# Patient Record
Sex: Female | Born: 1970 | Race: White | Hispanic: No | Marital: Single | Smoking: Current every day smoker
Health system: Southern US, Community
[De-identification: ages and names within clinical notes are randomized; demographics above are authoritative.]

## PROBLEM LIST (undated history)

## (undated) DIAGNOSIS — C801 Malignant (primary) neoplasm, unspecified: Secondary | ICD-10-CM

## (undated) DIAGNOSIS — M549 Dorsalgia, unspecified: Secondary | ICD-10-CM

## (undated) DIAGNOSIS — I63512 Cerebral infarction due to unspecified occlusion or stenosis of left middle cerebral artery: Secondary | ICD-10-CM

## (undated) DIAGNOSIS — J449 Chronic obstructive pulmonary disease, unspecified: Secondary | ICD-10-CM

## (undated) HISTORY — PX: LUNG SURGERY: SHX703

## (undated) HISTORY — PX: BREAST SURGERY: SHX581

## (undated) HISTORY — PX: OTHER SURGICAL HISTORY: SHX169

## (undated) HISTORY — PX: WRIST FRACTURE SURGERY: SHX121

## (undated) HISTORY — PX: LOBECTOMY: SHX5089

---

## 2019-07-23 ENCOUNTER — Other Ambulatory Visit: Payer: Self-pay

## 2019-07-23 ENCOUNTER — Ambulatory Visit
Admission: EM | Admit: 2019-07-23 | Discharge: 2019-07-23 | Disposition: A | Discharge status: Home / Self Care | Payer: Managed Care, Other (non HMO) | Attending: Physician Assistant | Admitting: Physician Assistant

## 2019-07-23 ENCOUNTER — Encounter: Payer: Self-pay | Admitting: Emergency Medicine

## 2019-07-23 DIAGNOSIS — Z85118 Personal history of other malignant neoplasm of bronchus and lung: Secondary | ICD-10-CM

## 2019-07-23 DIAGNOSIS — R590 Localized enlarged lymph nodes: Secondary | ICD-10-CM | POA: Insufficient documentation

## 2019-07-23 DIAGNOSIS — F17200 Nicotine dependence, unspecified, uncomplicated: Secondary | ICD-10-CM

## 2019-07-23 DIAGNOSIS — J029 Acute pharyngitis, unspecified: Secondary | ICD-10-CM | POA: Diagnosis present

## 2019-07-23 DIAGNOSIS — R509 Fever, unspecified: Secondary | ICD-10-CM | POA: Diagnosis present

## 2019-07-23 HISTORY — DX: Dorsalgia, unspecified: M54.9

## 2019-07-23 HISTORY — DX: Malignant (primary) neoplasm, unspecified: C80.1

## 2019-07-23 LAB — POCT RAPID STREP A (OFFICE): Rapid Strep A Screen: NEGATIVE

## 2019-07-23 NOTE — ED Triage Notes (Addendum)
Pt presents to [facility] for assessment of headache, "swollen" through (denies pain), and post-nasal drip.  Denies cough that is abnormal for her (smoker), denies n/v/d, denies abdominal pain.  States 100.5 temperature yesterday with relief with Tylenol.

## 2019-07-23 NOTE — Discharge Instructions (Addendum)
Rapid strep was negative. Covid and culture strep is pending and will call if positive. Most likely a viral infection. If worsen then f/u up. Otherwise symptomatic relief Tyelnol and fluids. If lymphadenopathy persist then please keep f/u with your PCP or oncologist.

## 2019-07-23 NOTE — ED Notes (Signed)
Patient able to ambulate independently  

## 2019-07-23 NOTE — ED Provider Notes (Signed)
[facility]    CSN: [id number] Arrival date & time: 07/23/19  1111      History   Chief Complaint Chief Complaint  Patient presents with  . URI    HPI Courtney Mayer is a 49 y.o. female.   Who has a history of stage 2 lung cancer, aneurysms and copd. She is currently an ongoing smoker. She presents with a less than 24 history of painless neck lymph nodes and mild sore throat with headache. She did have a fever yesterday as well. No chills. No acute cough, no N, V. No abdominal pain.      Past Medical History:  Diagnosis Date  . Back pain   . Cancer ([place])    lung    There are no problems to display for this patient.   Past Surgical History:  Procedure Laterality Date  . CESAREAN SECTION    . LOBECTOMY    . WRIST FRACTURE SURGERY      OB History   No obstetric history on file.      Home Medications    Prior to Admission medications   Medication Sig Start Date End Date Taking? Authorizing Provider  ALPRAZolam Duanne Moron) 0.25 MG tablet Take 0.25 mg by mouth at bedtime as needed for anxiety.   Yes [provider]  amitriptyline (ELAVIL) 50 MG tablet Take 50 mg by mouth at bedtime.   Yes [provider]  gabapentin (NEURONTIN) 800 MG tablet Take 900 mg by mouth 3 (three) times daily.   Yes [provider]  lurasidone (LATUDA) 40 MG TABS tablet Take 60 mg by mouth at bedtime.   Yes [provider]  methocarbamol (ROBAXIN) 750 MG tablet Take 750 mg by mouth 4 (four) times daily.   Yes [provider]  montelukast (SINGULAIR) 10 MG tablet Take 10 mg by mouth at bedtime.   Yes [provider]  oxyCODONE (ROXICODONE) 15 MG immediate release tablet Take 20 mg by mouth every 4 (four) hours as needed for pain.   Yes [provider]  oxyCODONE-acetaminophen (PERCOCET) 10-325 MG tablet Take 1 tablet by mouth every 4 (four) hours as needed for pain.   Yes [provider]  pantoprazole  (PROTONIX) 40 MG tablet Take 40 mg by mouth daily.   Yes [provider]    Family History Family History  Problem Relation Age of Onset  . Healthy Mother   . Healthy Father     Social History Social History   Tobacco Use  . Smoking status: Current Every Day Smoker    Packs/day: 1.00  . Smokeless tobacco: Never Used  Substance Use Topics  . Alcohol use: Not Currently  . Drug use: Never     Allergies   Amoxicillin and Compazine [prochlorperazine]   Review of Systems Review of Systems  Constitutional: Positive for fever. Negative for chills.  HENT: Positive for sore throat. Negative for congestion, postnasal drip, rhinorrhea and sinus pain.   Eyes: Negative.   Respiratory: Positive for cough.        Chronic with COPD and Lung NSC  Gastrointestinal: Negative for nausea and vomiting.  Musculoskeletal: Negative.   Neurological: Negative.   Psychiatric/Behavioral: Negative.      Physical Exam Triage Vital Signs ED Triage Vitals  Enc Vitals Group     BP      Pulse      Resp      Temp      Temp src  SpO2      Weight      Height      Head Circumference      Peak Flow      Pain Score      Pain Loc      Pain Edu?      Excl. in [place]?    No data found.  Updated Vital Signs BP 109/74 (BP Location: Left Arm)   Pulse (!) 104   Temp 98.2 F (36.8 C) (Temporal)   Resp 18   SpO2 91%   Visual Acuity Right Eye Distance:   Left Eye Distance:   Bilateral Distance:    Right Eye Near:   Left Eye Near:    Bilateral Near:     Physical Exam Vitals and nursing note reviewed.  Constitutional:      General: She is not in acute distress.    Appearance: Normal appearance. She is normal weight. She is not ill-appearing, toxic-appearing or diaphoretic.  HENT:     Head: Normocephalic and atraumatic.     Right Ear: Tympanic membrane normal.     Left Ear: Tympanic membrane normal.     Mouth/Throat:     Mouth: Mucous membranes are moist.     Pharynx:  Posterior oropharyngeal erythema present. No oropharyngeal exudate.     Comments: Injected oropharynx without frank exudate Eyes:     General: No scleral icterus. Neck:     Comments: Tender adenopathy to palpation Cardiovascular:     Rate and Rhythm: Normal rate.  Pulmonary:     Effort: Pulmonary effort is normal.     Breath sounds: No wheezing or rhonchi.  Musculoskeletal:     Cervical back: Normal range of motion.  Lymphadenopathy:     Cervical: Cervical adenopathy present.  Skin:    General: Skin is warm and dry.  Neurological:     General: No focal deficit present.     Mental Status: She is alert.  Psychiatric:        Thought Content: Thought content normal.      UC Treatments / Results  Labs (all labs ordered are listed, but only abnormal results are displayed) Labs Reviewed  POCT RAPID STREP A (OFFICE) - Normal  NOVEL CORONAVIRUS, NAA  CULTURE, GROUP A STREP [facility])    EKG   Radiology No results found.  Procedures Procedures (including critical care time)  Medications Ordered in UC Medications - No data to display  Initial Impression / Assessment and Plan / UC Course  I have reviewed the triage vital signs and the nursing notes.  Pertinent labs & imaging results that were available during my care of the patient were reviewed by me and considered in my medical decision making (see chart for details).     No indication of a bacterial infection or need for antibiotic use. Treat symptomatically. If the symptoms persist then f/u with PCP vs regular oncologist   Final Clinical Impressions(s) / UC Diagnoses   Final diagnoses:  Sore throat  Lymphadenopathy of right cervical region  Fever, unspecified     Discharge Instructions     Rapid strep was negative. Covid and culture strep is pending and will call if positive. Most likely a viral infection. If worsen then f/u up. Otherwise symptomatic relief Tyelnol and fluids. If lymphadenopathy persist then  please keep f/u with your PCP or oncologist.     ED Prescriptions    None     PDMP not reviewed this encounter.   Leafy Kindle  G, PA-C 07/23/19 1149

## 2019-07-24 LAB — SARS-COV-2, NAA 2 DAY TAT

## 2019-07-24 LAB — NOVEL CORONAVIRUS, NAA: SARS-CoV-2, NAA: NOT DETECTED

## 2019-07-28 LAB — CULTURE, GROUP A STREP (THRC)

## 2021-01-15 ENCOUNTER — Encounter (HOSPITAL_COMMUNITY): Payer: Self-pay

## 2021-01-15 ENCOUNTER — Emergency Department (HOSPITAL_COMMUNITY): Payer: Managed Care, Other (non HMO)

## 2021-01-15 ENCOUNTER — Emergency Department (HOSPITAL_COMMUNITY)
Admission: EM | Admit: 2021-01-15 | Discharge: 2021-01-15 | Disposition: A | Discharge status: Home / Self Care | Payer: Managed Care, Other (non HMO) | Attending: Emergency Medicine | Admitting: Emergency Medicine

## 2021-01-15 DIAGNOSIS — Y92410 Unspecified street and highway as the place of occurrence of the external cause: Secondary | ICD-10-CM | POA: Insufficient documentation

## 2021-01-15 DIAGNOSIS — S80819A Abrasion, unspecified lower leg, initial encounter: Secondary | ICD-10-CM | POA: Insufficient documentation

## 2021-01-15 DIAGNOSIS — S0990XA Unspecified injury of head, initial encounter: Secondary | ICD-10-CM | POA: Insufficient documentation

## 2021-01-15 DIAGNOSIS — S3991XA Unspecified injury of abdomen, initial encounter: Secondary | ICD-10-CM | POA: Insufficient documentation

## 2021-01-15 DIAGNOSIS — Z79899 Other long term (current) drug therapy: Secondary | ICD-10-CM | POA: Insufficient documentation

## 2021-01-15 DIAGNOSIS — Y900 Blood alcohol level of less than 20 mg/100 ml: Secondary | ICD-10-CM | POA: Insufficient documentation

## 2021-01-15 DIAGNOSIS — J449 Chronic obstructive pulmonary disease, unspecified: Secondary | ICD-10-CM | POA: Diagnosis not present

## 2021-01-15 DIAGNOSIS — Z23 Encounter for immunization: Secondary | ICD-10-CM | POA: Diagnosis not present

## 2021-01-15 DIAGNOSIS — S8990XA Unspecified injury of unspecified lower leg, initial encounter: Secondary | ICD-10-CM | POA: Diagnosis present

## 2021-01-15 DIAGNOSIS — T148XXA Other injury of unspecified body region, initial encounter: Secondary | ICD-10-CM

## 2021-01-15 DIAGNOSIS — M545 Low back pain, unspecified: Secondary | ICD-10-CM | POA: Insufficient documentation

## 2021-01-15 DIAGNOSIS — F17210 Nicotine dependence, cigarettes, uncomplicated: Secondary | ICD-10-CM | POA: Diagnosis not present

## 2021-01-15 HISTORY — DX: Chronic obstructive pulmonary disease, unspecified: J44.9

## 2021-01-15 LAB — CBC WITH DIFFERENTIAL/PLATELET
Abs Immature Granulocytes: 0.06 10*3/uL (ref 0.00–0.07)
Basophils Absolute: 0.1 10*3/uL (ref 0.0–0.1)
Basophils Relative: 1 %
Eosinophils Absolute: 0.4 10*3/uL (ref 0.0–0.5)
Eosinophils Relative: 3 %
HCT: 39.7 % (ref 36.0–46.0)
Hemoglobin: 12.7 g/dL (ref 12.0–15.0)
Immature Granulocytes: 1 %
Lymphocytes Relative: 39 %
Lymphs Abs: 5 10*3/uL — ABNORMAL HIGH (ref 0.7–4.0)
MCH: 32.6 pg (ref 26.0–34.0)
MCHC: 32 g/dL (ref 30.0–36.0)
MCV: 101.8 fL — ABNORMAL HIGH (ref 80.0–100.0)
Monocytes Absolute: 0.9 10*3/uL (ref 0.1–1.0)
Monocytes Relative: 7 %
Neutro Abs: 6.4 10*3/uL (ref 1.7–7.7)
Neutrophils Relative %: 49 %
Platelets: 411 10*3/uL — ABNORMAL HIGH (ref 150–400)
RBC: 3.9 MIL/uL (ref 3.87–5.11)
RDW: 14.7 % (ref 11.5–15.5)
WBC: 12.8 10*3/uL — ABNORMAL HIGH (ref 4.0–10.5)
nRBC: 0 % (ref 0.0–0.2)

## 2021-01-15 LAB — COMPREHENSIVE METABOLIC PANEL
ALT: 21 U/L (ref 0–44)
AST: 22 U/L (ref 15–41)
Albumin: 3.2 g/dL — ABNORMAL LOW (ref 3.5–5.0)
Alkaline Phosphatase: 67 U/L (ref 38–126)
Anion gap: 9 (ref 5–15)
BUN: 12 mg/dL (ref 6–20)
CO2: 22 mmol/L (ref 22–32)
Calcium: 9 mg/dL (ref 8.9–10.3)
Chloride: 106 mmol/L (ref 98–111)
Creatinine, Ser: 0.93 mg/dL (ref 0.44–1.00)
GFR, Estimated: >60 mL/min (ref >60)
Glucose, Bld: 116 mg/dL — ABNORMAL HIGH (ref 70–99)
Potassium: 4 mmol/L (ref 3.5–5.1)
Sodium: 137 mmol/L (ref 135–145)
Total Bilirubin: 0.6 mg/dL (ref 0.3–1.2)
Total Protein: 6.3 g/dL — ABNORMAL LOW (ref 6.5–8.1)

## 2021-01-15 LAB — TYPE AND SCREEN
ABO/RH(D): O POS
Antibody Screen: NEGATIVE

## 2021-01-15 LAB — I-STAT CHEM 8, ED
BUN: 12 mg/dL (ref 6–20)
Calcium, Ion: 1.06 mmol/L — ABNORMAL LOW (ref 1.15–1.40)
Chloride: 108 mmol/L (ref 98–111)
Creatinine, Ser: 0.9 mg/dL (ref 0.44–1.00)
Glucose, Bld: 110 mg/dL — ABNORMAL HIGH (ref 70–99)
HCT: 38 % (ref 36.0–46.0)
Hemoglobin: 12.9 g/dL (ref 12.0–15.0)
Potassium: 4 mmol/L (ref 3.5–5.1)
Sodium: 139 mmol/L (ref 135–145)
TCO2: 24 mmol/L (ref 22–32)

## 2021-01-15 LAB — ETHANOL: Alcohol, Ethyl (B): <10 mg/dL (ref <10)

## 2021-01-15 LAB — ABO/RH: ABO/RH(D): O POS

## 2021-01-15 MED ORDER — IOHEXOL 350 MG/ML SOLN: 80.0000 mL | Freq: Once | INTRAVENOUS | Status: DC | PRN

## 2021-01-15 MED ORDER — SODIUM CHLORIDE 0.9 % IV BOLUS
500.0000 mL | Freq: Once | INTRAVENOUS | Status: AC
  Administered 2021-01-15: 500 mL via INTRAVENOUS

## 2021-01-15 MED ORDER — ALPRAZOLAM 0.25 MG PO TABS
1.0000 mg | ORAL_TABLET | Freq: Once | ORAL | Status: AC
  Administered 2021-01-15: 1 mg via ORAL
  Filled 2021-01-15: qty 4

## 2021-01-15 MED ORDER — TETANUS-DIPHTH-ACELL PERTUSSIS 5-2.5-18.5 LF-MCG/0.5 IM SUSY
0.5000 mL | PREFILLED_SYRINGE | Freq: Once | INTRAMUSCULAR | Status: AC
  Administered 2021-01-15: 0.5 mL via INTRAMUSCULAR
  Filled 2021-01-15: qty 0.5

## 2021-01-15 MED ORDER — IOHEXOL 350 MG/ML SOLN
80.0000 mL | Freq: Once | INTRAVENOUS | Status: AC | PRN
  Administered 2021-01-15: 80 mL via INTRAVENOUS
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
## 2021-01-15 NOTE — ED Notes (Signed)
C-Collar removed by MD. Pt given ginger ale per request. No acute changes noted. Will continue to monitor.

## 2021-01-15 NOTE — ED Notes (Signed)
Family updated as to patient's status.

## 2021-01-15 NOTE — ED Notes (Signed)
Pt back from CT
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
## 2021-01-15 NOTE — ED Notes (Signed)
X-ray at bedside
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
## 2021-01-15 NOTE — Discharge Instructions (Addendum)
Return for any problem.  ?

## 2021-01-15 NOTE — ED Notes (Signed)
MD at bedside to discuss results.
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
## 2021-01-15 NOTE — ED Notes (Signed)
Patient transported to CT 

## 2021-01-16 ENCOUNTER — Encounter: Payer: Self-pay | Admitting: Emergency Medicine

## 2021-05-14 ENCOUNTER — Inpatient Hospital Stay (HOSPITAL_COMMUNITY)
Admission: EM | Admit: 2021-05-14 | Discharge: 2021-05-17 | DRG: 064 | Disposition: A | Discharge status: Home / Self Care | Payer: Managed Care, Other (non HMO) | Attending: Internal Medicine | Admitting: Internal Medicine

## 2021-05-14 DIAGNOSIS — Z681 Body mass index (BMI) 19 or less, adult: Secondary | ICD-10-CM

## 2021-05-14 DIAGNOSIS — R636 Underweight: Secondary | ICD-10-CM | POA: Diagnosis present

## 2021-05-14 DIAGNOSIS — J9601 Acute respiratory failure with hypoxia: Secondary | ICD-10-CM | POA: Diagnosis present

## 2021-05-14 DIAGNOSIS — Z85118 Personal history of other malignant neoplasm of bronchus and lung: Secondary | ICD-10-CM

## 2021-05-14 DIAGNOSIS — M549 Dorsalgia, unspecified: Secondary | ICD-10-CM | POA: Diagnosis present

## 2021-05-14 DIAGNOSIS — I6330 Cerebral infarction due to thrombosis of unspecified cerebral artery: Secondary | ICD-10-CM | POA: Insufficient documentation

## 2021-05-14 DIAGNOSIS — Z79899 Other long term (current) drug therapy: Secondary | ICD-10-CM

## 2021-05-14 DIAGNOSIS — Z86711 Personal history of pulmonary embolism: Secondary | ICD-10-CM

## 2021-05-14 DIAGNOSIS — F1120 Opioid dependence, uncomplicated: Secondary | ICD-10-CM | POA: Diagnosis present

## 2021-05-14 DIAGNOSIS — I10 Essential (primary) hypertension: Secondary | ICD-10-CM | POA: Diagnosis present

## 2021-05-14 DIAGNOSIS — Z881 Allergy status to other antibiotic agents status: Secondary | ICD-10-CM

## 2021-05-14 DIAGNOSIS — I63412 Cerebral infarction due to embolism of left middle cerebral artery: Secondary | ICD-10-CM | POA: Diagnosis not present

## 2021-05-14 DIAGNOSIS — Z902 Acquired absence of lung [part of]: Secondary | ICD-10-CM

## 2021-05-14 DIAGNOSIS — J189 Pneumonia, unspecified organism: Secondary | ICD-10-CM | POA: Diagnosis present

## 2021-05-14 DIAGNOSIS — J209 Acute bronchitis, unspecified: Secondary | ICD-10-CM | POA: Diagnosis present

## 2021-05-14 DIAGNOSIS — G8191 Hemiplegia, unspecified affecting right dominant side: Secondary | ICD-10-CM | POA: Diagnosis present

## 2021-05-14 DIAGNOSIS — F32A Depression, unspecified: Secondary | ICD-10-CM | POA: Diagnosis present

## 2021-05-14 DIAGNOSIS — G8929 Other chronic pain: Secondary | ICD-10-CM | POA: Diagnosis present

## 2021-05-14 DIAGNOSIS — R29702 NIHSS score 2: Secondary | ICD-10-CM | POA: Diagnosis present

## 2021-05-14 DIAGNOSIS — J440 Chronic obstructive pulmonary disease with acute lower respiratory infection: Secondary | ICD-10-CM | POA: Diagnosis present

## 2021-05-14 DIAGNOSIS — F419 Anxiety disorder, unspecified: Secondary | ICD-10-CM | POA: Diagnosis present

## 2021-05-14 DIAGNOSIS — Z888 Allergy status to other drugs, medicaments and biological substances status: Secondary | ICD-10-CM

## 2021-05-14 DIAGNOSIS — E785 Hyperlipidemia, unspecified: Secondary | ICD-10-CM | POA: Diagnosis present

## 2021-05-14 DIAGNOSIS — R6520 Severe sepsis without septic shock: Secondary | ICD-10-CM

## 2021-05-14 DIAGNOSIS — Z20822 Contact with and (suspected) exposure to covid-19: Secondary | ICD-10-CM | POA: Diagnosis present

## 2021-05-14 DIAGNOSIS — F17210 Nicotine dependence, cigarettes, uncomplicated: Secondary | ICD-10-CM | POA: Diagnosis present

## 2021-05-14 DIAGNOSIS — J441 Chronic obstructive pulmonary disease with (acute) exacerbation: Secondary | ICD-10-CM | POA: Diagnosis present

## 2021-05-14 DIAGNOSIS — A419 Sepsis, unspecified organism: Principal | ICD-10-CM

## 2021-05-14 DIAGNOSIS — Z886 Allergy status to analgesic agent status: Secondary | ICD-10-CM

## 2021-05-14 DIAGNOSIS — Z7951 Long term (current) use of inhaled steroids: Secondary | ICD-10-CM

## 2021-05-14 DIAGNOSIS — R29898 Other symptoms and signs involving the musculoskeletal system: Secondary | ICD-10-CM | POA: Diagnosis present

## 2021-05-14 MED ORDER — LACTATED RINGERS IV BOLUS (SEPSIS)
500.0000 mL | Freq: Once | INTRAVENOUS | Status: AC
  Administered 2021-05-15: 500 mL via INTRAVENOUS

## 2021-05-14 MED ORDER — VANCOMYCIN HCL IN DEXTROSE 1-5 GM/200ML-% IV SOLN
1000.0000 mg | Freq: Once | INTRAVENOUS | Status: AC
  Administered 2021-05-15: 1000 mg via INTRAVENOUS
  Filled 2021-05-14: qty 200

## 2021-05-14 MED ORDER — LACTATED RINGERS IV BOLUS (SEPSIS)
250.0000 mL | Freq: Once | INTRAVENOUS | Status: AC
  Administered 2021-05-15: 250 mL via INTRAVENOUS

## 2021-05-14 MED ORDER — SODIUM CHLORIDE 0.9 % IV SOLN
2.0000 g | Freq: Once | INTRAVENOUS | Status: AC
  Administered 2021-05-15: 2 g via INTRAVENOUS
  Filled 2021-05-14: qty 2

## 2021-05-14 MED ORDER — LACTATED RINGERS IV BOLUS (SEPSIS)
1000.0000 mL | Freq: Once | INTRAVENOUS | Status: AC
  Administered 2021-05-15: 1000 mL via INTRAVENOUS

## 2021-05-14 MED ORDER — LACTATED RINGERS IV SOLN: INTRAVENOUS | Status: AC

## 2021-05-14 NOTE — ED Provider Notes (Signed)
[facility] EMERGENCY DEPARTMENT Provider Note   CSN: [id number] Arrival date & time: 05/14/21  2221     History  Chief Complaint  Patient presents with   Shortness of Breath   Weakness    Courtney Mayer is a 51 y.o. female who presents via EMS for concern for right upper extremity weakness that started this evening.  Additionally states that she has been more short of breath today.  Recently diagnosed with pneumonia 1 week ago and completed her course of Levaquin and prednisone today.  She is history of lung cancer with left upper lobectomy in the past.  Not currently on any treatment for malignancy.  She was tachycardic and tachypneic with EMS.  I have personally reviewed this patient's medical records.  She is history of lung cancer and COPD.  She is not anticoagulated and has never had any history of CVA in the past.  NIH of 0 with EMS.  Per patient, she was febrile to 19 F with EMS. Smokes 1.5-2 packs of cigarettes daily. HPI     Home Medications Prior to Admission medications   Medication Sig Start Date End Date Taking? Authorizing Provider  albuterol (VENTOLIN HFA) 108 (90 Base) MCG/ACT inhaler Inhale 2 puffs into the lungs every 6 (six) hours as needed for wheezing or shortness of breath.   Yes [provider]  ALPRAZolam (XANAX) 0.25 MG tablet Take 0.25 mg by mouth at bedtime as needed for anxiety.   Yes [provider]  amitriptyline (ELAVIL) 150 MG tablet Take 150 mg by mouth at bedtime. 12/31/20  Yes [provider]  gabapentin (NEURONTIN) 600 MG tablet Take 900 mg by mouth 3 (three) times daily. 01/12/21  Yes [provider]  LATUDA 60 MG TABS Take 60 mg by mouth daily. 01/03/21  Yes [provider]  levofloxacin (LEVAQUIN) 750 MG tablet Take 750 mg by mouth daily. 05/08/21  Yes [provider]  methocarbamol (ROBAXIN) 750 MG tablet Take 1,500 mg by mouth 4 (four) times daily. 01/01/21  Yes [provider]  metoCLOPramide (REGLAN) 10 MG tablet Take 10 mg by mouth daily as needed for nausea.   Yes [provider]  montelukast (SINGULAIR) 10 MG tablet Take 10 mg by mouth at bedtime.   Yes [provider]  Multiple Vitamin (MULTIVITAMIN WITH MINERALS) TABS tablet Take 1 tablet by mouth daily.   Yes [provider]  oxyCODONE-acetaminophen (PERCOCET) 10-325 MG tablet Take 1 tablet by mouth 4 (four) times daily as needed for pain. 01/15/21  Yes [provider]  OXYCONTIN 30 MG 12 hr tablet Take 30 tablets by mouth 2 (two) times daily. 01/15/21  Yes [provider]  pantoprazole (PROTONIX) 40 MG tablet Take 40 mg by mouth daily. 01/14/21  Yes [provider]  predniSONE (DELTASONE) 10 MG tablet Take 10 mg by mouth 2 (two) times daily. 05/08/21  Yes [provider]  QUEtiapine (SEROQUEL) 50 MG tablet Take 50 mg by mouth at bedtime. 05/13/21  Yes [provider]  amitriptyline (ELAVIL) 50 MG tablet Take 50 mg by mouth at bedtime. Patient not taking: Reported on 05/15/2021    [provider]  lurasidone (LATUDA) 40 MG TABS tablet Take 60 mg by mouth at bedtime. Patient not taking: Reported on 05/15/2021    [provider]  oxyCODONE (ROXICODONE) 15 MG immediate release tablet Take 20 mg by mouth every 4 (four) hours as needed for pain. Patient not taking: Reported on 05/15/2021  [provider]  TRELEGY ELLIPTA 100-62.5-25 MCG/INH AEPB Inhale 1 puff into the lungs daily. 01/15/21   [provider]      Allergies    Tramadol, Amoxicillin, Compazine [prochlorperazine], and Compazine [prochlorperazine]    Review of Systems   Review of Systems  Constitutional:  Positive for chills and fatigue.  HENT: Negative.    Eyes: Negative.   Respiratory:  Positive for choking, chest tightness and shortness of breath.   Cardiovascular: Negative.   Gastrointestinal: Negative.   Genitourinary:  Negative.   Musculoskeletal: Negative.   Skin: Negative.   Neurological:  Positive for weakness and light-headedness. Negative for seizures, syncope and headaches.   Physical Exam Updated Vital Signs BP 130/87    Pulse 87    Temp 99.9 F (37.7 C) (Oral)    Resp (!) 22    Ht 5\' 5"  (1.651 m)    Wt 50.8 kg    SpO2 95%    BMI 18.64 kg/m  Physical Exam Vitals and nursing note reviewed.  Constitutional:      Appearance: She is ill-appearing. She is not toxic-appearing.  HENT:     Head: Normocephalic and atraumatic.     Nose: Nose normal.     Mouth/Throat:     Mouth: Mucous membranes are moist.     Pharynx: Oropharynx is clear. Uvula midline. No oropharyngeal exudate or posterior oropharyngeal erythema.     Tonsils: No tonsillar exudate.     Comments: Oropharyngeal thrush (recently completed prescribed steroids) Eyes:     General: Lids are normal. Vision grossly intact.        Right eye: No discharge.        Left eye: No discharge.     Extraocular Movements: Extraocular movements intact.     Conjunctiva/sclera: Conjunctivae normal.     Pupils: Pupils are equal, round, and reactive to light.  Neck:     Trachea: Trachea and phonation normal.  Cardiovascular:     Rate and Rhythm: Regular rhythm. Tachycardia present.     Pulses: Normal pulses.     Heart sounds: Normal heart sounds. No murmur heard. Pulmonary:     Effort: Pulmonary effort is normal. Tachypnea present. No respiratory distress or retractions.     Breath sounds: Examination of the left-upper field reveals decreased breath sounds. Decreased breath sounds present. No wheezing or rales.     Comments: Coarse breath sounds in the left lung base and the right lung base Chest:     Chest wall: No mass, lacerations, deformity, swelling, tenderness, crepitus or edema.  Abdominal:     General: Bowel sounds are normal. There is no distension.     Tenderness: There is no abdominal tenderness. There is no right CVA tenderness, left CVA  tenderness, guarding or rebound.  Musculoskeletal:        General: No deformity.     Cervical back: Normal range of motion and neck supple. No edema, rigidity or crepitus. No pain with movement, spinous process tenderness or muscular tenderness.     Right lower leg: No edema.     Left lower leg: No edema.  Lymphadenopathy:     Cervical: No cervical adenopathy.  Skin:    General: Skin is warm and dry.     Capillary Refill: Capillary refill takes less than 2 seconds.  Neurological:     Mental Status: She is alert and oriented to person, place, and time. Mental status is at baseline.     GCS: GCS eye subscore is  4. GCS verbal subscore is 5. GCS motor subscore is 6.     Cranial Nerves: Cranial nerves 2-12 are intact.     Sensory: Sensation is intact.     Motor: Pronator drift present. No weakness or tremor.     Coordination: Coordination is intact.     Gait: Gait is intact.     Comments: Patient reports increased sensitivity to touch in the right arm without pain. Symmetric grip strength bilaterally as well as elbow flexion extension.  Symmetric strength in plantar and dorsiflexion bilaterally.  Question right upper extremity pronator drift on exam, however patient can be seen to pull her hair up in a ponytail with minimal challenge.  Additionally when closing her eyes during pronator exam her whole body falls towards the right rather than just the arm.  Psychiatric:        Mood and Affect: Mood normal.    ED Results / Procedures / Treatments   Labs (all labs ordered are listed, but only abnormal results are displayed) Labs Reviewed  COMPREHENSIVE METABOLIC PANEL - Abnormal; Notable for the following components:      Result Value   Potassium 5.8 (*)    Glucose, Bld 108 (*)    Albumin 2.6 (*)    AST 45 (*)    ALT 51 (*)    Alkaline Phosphatase 153 (*)    Total Bilirubin 1.7 (*)    All other components within normal limits  CBC WITH DIFFERENTIAL/PLATELET - Abnormal; Notable for the  following components:   WBC 15.5 (*)    Platelets 458 (*)    Neutro Abs 10.5 (*)    Monocytes Absolute 1.1 (*)    Abs Immature Granulocytes 0.29 (*)    All other components within normal limits  RESP PANEL BY RT-PCR (FLU A&B, COVID) ARPGX2  CULTURE, BLOOD (ROUTINE X 2)  CULTURE, BLOOD (ROUTINE X 2)  URINE CULTURE  LACTIC ACID, PLASMA  PROTIME-INR  APTT  URINALYSIS, ROUTINE W REFLEX MICROSCOPIC  BRAIN NATRIURETIC PEPTIDE  LACTIC ACID, PLASMA  RAPID URINE DRUG SCREEN, HOSP PERFORMED  TROPONIN I (HIGH SENSITIVITY)  TROPONIN I (HIGH SENSITIVITY)    EKG None  Radiology CT ANGIO HEAD NECK W WO CM  Result Date: 05/15/2021 CLINICAL DATA:  Initial evaluation for neuro deficit, stroke. EXAM: CT ANGIOGRAPHY HEAD AND NECK TECHNIQUE: Multidetector CT imaging of the head and neck was performed using the standard protocol during bolus administration of intravenous contrast. Multiplanar CT image reconstructions and MIPs were obtained to evaluate the vascular anatomy. Carotid stenosis measurements (when applicable) are obtained utilizing NASCET criteria, using the distal internal carotid diameter as the denominator. RADIATION DOSE REDUCTION: This exam was performed according to the departmental dose-optimization program which includes automated exposure control, adjustment of the mA and/or kV according to patient size and/or use of iterative reconstruction technique. CONTRAST:  38mL OMNIPAQUE IOHEXOL 350 MG/ML SOLN COMPARISON:  Prior study from 01/15/2021. FINDINGS: CT HEAD FINDINGS Brain: Examination degraded by motion artifact. Cerebral volume within normal limits. No acute intracranial hemorrhage. No visible acute large vessel territory infarct. No mass lesion, midline shift or mass effect. No hydrocephalus or extra-axial fluid collection. Vascular: No visible hyperdense vessel. Skull: Scalp soft tissues demonstrate no acute finding. Calvarium grossly intact. Sinuses: Paranasal sinuses and mastoid air  cells are grossly clear. Orbits: No obvious abnormality about the globes orbital soft tissues. Review of the MIP images confirms the above findings CTA NECK FINDINGS Aortic arch: A shin examination degraded by motion. Visualized aortic arch  normal caliber with normal branch pattern. Mild plaque within the arch itself. No stenosis about the origin the great vessels. Right carotid system: Right common and internal carotid arteries patent without stenosis or dissection. Left carotid system: Left CCA patent from its origin to the bifurcation without stenosis. Eccentric soft plaque at the left carotid bulb/proximal left ICA with associated stenosis of up to approximately 50% by NASCET criteria. Left ICA patent distally without stenosis or dissection. Vertebral arteries: Both vertebral arteries arise from subclavian arteries. No proximal subclavian artery stenosis. Right vertebral artery dominant. Vertebral arteries patent without stenosis or dissection. Skeleton: No discrete or worrisome osseous lesions. Mild chronic height loss noted at the superior endplates of T2 through T4. Other neck: No other acute soft tissue abnormality within the neck. Upper chest: Scattered patchy multifocal opacities seen within the partially visualized left lung, suspicious for possible infection/pneumonia. Underlying centrilobular emphysema. Review of the MIP images confirms the above findings CTA HEAD FINDINGS Anterior circulation: Both internal carotid arteries patent to the termini without stenosis. A1 segments patent bilaterally. Normal anterior communicating artery complex. Anterior cerebral arteries widely patent bilaterally. No M1 stenosis or occlusion. 4 mm saccular aneurysm seen at the left MCA bifurcation (series 11, image 102). 3 mm saccular aneurysm present at the contralateral right MCA bifurcation (series 11, image 104). Distal MCA branches well perfused and symmetric. Additional Posterior circulation: Both V4 segments patent  to the vertebrobasilar junction without stenosis. Both PICA origins patent and normal. Basilar patent to its distal aspect without stenosis. Superior cerebellar and posterior cerebral arteries widely patent bilaterally. Venous sinuses: Patent allowing for timing of contrast bolus. Anatomic variants: None significant. Review of the MIP images confirms the above findings IMPRESSION: CT HEAD IMPRESSION: 1. Motion degraded exam. 2. No acute intracranial abnormality. CTA HEAD AND NECK IMPRESSION: 1. Negative CTA for large vessel occlusion. 2. 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 3. 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 4. Patchy multifocal opacities within the partially visualized left lung, suspicious for infection/pneumonia. 5. Emphysema (ICD10-J43.9). Electronically Signed   By: Jeannine Boga M.D.   On: 05/15/2021 04:39   DG Chest Port 1 View  Result Date: 05/15/2021 CLINICAL DATA:  Possible sepsis.  Right arm pain. EXAM: PORTABLE CHEST 1 VIEW COMPARISON:  01/15/2021. FINDINGS: The heart size and mediastinal contours are stable. Apical pleural scarring and postsurgical changes are noted in the left upper lobe. There is elevation of the left diaphragm with patchy interstitial and airspace opacities in the mid to lower left lung field. The right lung is clear. No effusion or pneumothorax. No acute osseous abnormality. IMPRESSION: 1. Interstitial and airspace opacities in the mid to lower left lung field, possible developing pneumonia. 2. Stable postsurgical changes and apical pleural thickening on the left. Electronically Signed   By: Brett Fairy M.D.   On: 05/15/2021 00:22    Procedures .Critical Care Performed by: Emeline Darling, PA-C Authorized by: Emeline Darling, PA-C   Critical care provider statement:    Critical care time (minutes):  45   Critical care was necessary to treat or prevent imminent or  life-threatening deterioration of the following conditions:  Sepsis   Critical care was time spent personally by me on the following activities:  Development of treatment plan with patient or surrogate, discussions with consultants, evaluation of patient's response to treatment, examination of patient, obtaining history from patient or surrogate, ordering and  performing treatments and interventions, ordering and review of laboratory studies, ordering and review of radiographic studies, pulse oximetry and re-evaluation of patient's condition    Medications Ordered in ED Medications  lactated ringers infusion (0 mLs Intravenous Paused 05/15/21 0627)  ceFEPIme (MAXIPIME) 2 g in sodium chloride 0.9 % 100 mL IVPB (has no administration in time range)  vancomycin (VANCOREADY) IVPB 500 mg/100 mL (has no administration in time range)  nicotine (NICODERM CQ - dosed in mg/24 hours) patch 21 mg (has no administration in time range)  acetaminophen (TYLENOL) tablet 650 mg (has no administration in time range)    Or  acetaminophen (TYLENOL) suppository 650 mg (has no administration in time range)  ipratropium-albuterol (DUONEB) 0.5-2.5 (3) MG/3ML nebulizer solution 3 mL (has no administration in time range)  albuterol (PROVENTIL) (2.5 MG/3ML) 0.083% nebulizer solution 2.5 mg (has no administration in time range)  methylPREDNISolone sodium succinate (SOLU-MEDROL) 125 mg/2 mL injection 80 mg (has no administration in time range)  vancomycin (VANCOCIN) IVPB 1000 mg/200 mL premix (0 mg Intravenous Stopped 05/15/21 0350)  ceFEPIme (MAXIPIME) 2 g in sodium chloride 0.9 % 100 mL IVPB (0 g Intravenous Stopped 05/15/21 0504)  lactated ringers bolus 1,000 mL (0 mLs Intravenous Stopped 05/15/21 0350)    And  lactated ringers bolus 500 mL (0 mLs Intravenous Stopped 05/15/21 0609)    And  lactated ringers bolus 250 mL (0 mLs Intravenous Stopped 05/15/21 0609)  iohexol (OMNIPAQUE) 350 MG/ML injection 75 mL (75 mLs Intravenous  Contrast Given 05/15/21 0238)  ALPRAZolam (XANAX) tablet 0.5 mg (0.5 mg Oral Given 05/15/21 0449)  LORazepam (ATIVAN) injection 1 mg (1 mg Intravenous Given 05/15/21 0623)  gadobutrol (GADAVIST) 1 MMOL/ML injection 5 mL (5 mLs Intravenous Contrast Given 05/15/21 0730)    ED Course/ Medical Decision Making/ A&P Clinical Course as of 05/15/21 0754  Wed May 15, 2021  0007 Unclear time frame for [facility] [address]. Patient is at least 5 hours out from onset of acute RUE weakness. Consult to Dr. Leonel Ramsay, neurologist, who recommends CTA Head and Neck, patient will likely require MRI brain w/wo contrast.   [RS]  [phone] Consult to hospitalist Dr. Velia Meyer, who is agreeable to seeing this patient admitting her to his service.  Appreciate his collaboration in the care of this patient. [RS]    Clinical Course User Index [RS] Emeline Darling, PA-C                           Medical Decision Making 51 year old female with history of pneumonia who presents with concern for worsening shortness of breath and right upper extremity weakness that started this evening.  The differential diagnosis of weakness includes but is not limited to: Neurologic causes: GBS, myasthenia gravis, CVA, MS, ALS, transverse myelitis, spinal cord injury, CVA, botulism Other causes: ACS, Arrhythmia, syncope, orthostatic hypotension, sepsis, hypoglycemia, electrolyte disturbance, hypothyroidism, respiratory failure, symptomatic anemia, dehydration, heat injury, polypharmacy, malignancy. Additionally for shortness of breath question worsening infection, new mass, pleural effusion, PE.  At time of my evaluation patient is tachycardic to the 120s, hypotensive with systolic BP of 96 and tachypneic to the mid 20s.  Cardiac exam as above with tachycardia with regular rhythm.  Lung exam as above concerning for infection on the left lung.  Given suspected source of infection with patient's known pneumonia and abnormal vital signs patient does meet  sepsis criteria.  Code sepsis activated.  Antibiotics include bolus ordered per sepsis order set.   Amount  and/or Complexity of Data Reviewed Labs: ordered.    Details: CBC with leukocytosis of 15,000 without anemia.  CMP with hyperkalemia 5.8 and transaminitis with AST/ALT 45/51.  Elevated total bili 1.7 which is new for the patient.  UA unremarkable, respiratory pathogen panel is negative.  Troponin is negative x2, 4 than 8.  Coags are normal, lactic acid is normal. Radiology: ordered.    Details: Chest x-ray with left-sided mid and lower lung opacities concerning for pneumonia.  CTA head and neck without LVO.  50% stenosis of the origin of the left cervical ICA and bilateral MCA bifurcation aneurysms. ECG/medicine tests: ordered.    Details: EKG with sinus tachycardia without STEMI. Discussion of management or test interpretation with external provider(s): Neurologic exam discussed with Dr. Leonel Ramsay as above, as patient is outside the 4-hour window no code stroke was activated.  Per his recommendation we will proceed with CT of the head and neck in addition to CT of the head.  Patient may require MRI following CT imaging.  Case discussed with hospitalist as well who is agreeable to admit this patient for sepsis and completion of stroke work-up.  Risk OTC drugs. Prescription drug management. Decision regarding hospitalization.   Given patient's septic presentation upon my evaluation initially do feel she would benefit from mission to the hospital for further stabilization.  Zenda voiced understanding of her medical evaluation and treatment plan.  Each of her questions was answered to her expressed satisfaction.  She is amenable to plan for admission at this time. MRI pending at this time.   This chart was dictated using voice recognition software, Dragon. Despite the best efforts of this provider to proofread and correct errors, errors may still occur which can change documentation  meaning.   Final Clinical Impression(s) / ED Diagnoses Final diagnoses:  None    Rx / DC Orders ED Discharge Orders     None         Aura Dials 05/15/21 Clarks, April, MD 05/16/21 2318

## 2021-05-14 NOTE — ED Triage Notes (Signed)
Pt BIB EMS for right arm weakness since about 2030 tonight. Pt diagnoses with pneumonia last wednesday but states she feels more short of breathe today. NIH 0 with EMS   End tidal  27 28-30 RR  130/80  HR 110-120 NSR

## 2021-05-15 ENCOUNTER — Inpatient Hospital Stay (HOSPITAL_COMMUNITY): Payer: Managed Care, Other (non HMO)

## 2021-05-15 ENCOUNTER — Emergency Department (HOSPITAL_COMMUNITY): Payer: Managed Care, Other (non HMO)

## 2021-05-15 DIAGNOSIS — R636 Underweight: Secondary | ICD-10-CM | POA: Diagnosis present

## 2021-05-15 DIAGNOSIS — I63412 Cerebral infarction due to embolism of left middle cerebral artery: Secondary | ICD-10-CM | POA: Diagnosis present

## 2021-05-15 DIAGNOSIS — Z881 Allergy status to other antibiotic agents status: Secondary | ICD-10-CM | POA: Diagnosis not present

## 2021-05-15 DIAGNOSIS — Z79899 Other long term (current) drug therapy: Secondary | ICD-10-CM | POA: Diagnosis not present

## 2021-05-15 DIAGNOSIS — R29702 NIHSS score 2: Secondary | ICD-10-CM | POA: Diagnosis present

## 2021-05-15 DIAGNOSIS — F32A Depression, unspecified: Secondary | ICD-10-CM | POA: Diagnosis present

## 2021-05-15 DIAGNOSIS — J9601 Acute respiratory failure with hypoxia: Secondary | ICD-10-CM

## 2021-05-15 DIAGNOSIS — I639 Cerebral infarction, unspecified: Secondary | ICD-10-CM | POA: Diagnosis not present

## 2021-05-15 DIAGNOSIS — I10 Essential (primary) hypertension: Secondary | ICD-10-CM | POA: Diagnosis present

## 2021-05-15 DIAGNOSIS — Z886 Allergy status to analgesic agent status: Secondary | ICD-10-CM | POA: Diagnosis not present

## 2021-05-15 DIAGNOSIS — G8929 Other chronic pain: Secondary | ICD-10-CM | POA: Diagnosis present

## 2021-05-15 DIAGNOSIS — J209 Acute bronchitis, unspecified: Secondary | ICD-10-CM | POA: Diagnosis present

## 2021-05-15 DIAGNOSIS — J440 Chronic obstructive pulmonary disease with acute lower respiratory infection: Secondary | ICD-10-CM | POA: Diagnosis present

## 2021-05-15 DIAGNOSIS — G8191 Hemiplegia, unspecified affecting right dominant side: Secondary | ICD-10-CM | POA: Diagnosis present

## 2021-05-15 DIAGNOSIS — Z681 Body mass index (BMI) 19 or less, adult: Secondary | ICD-10-CM | POA: Diagnosis not present

## 2021-05-15 DIAGNOSIS — J441 Chronic obstructive pulmonary disease with (acute) exacerbation: Secondary | ICD-10-CM | POA: Diagnosis present

## 2021-05-15 DIAGNOSIS — Z888 Allergy status to other drugs, medicaments and biological substances status: Secondary | ICD-10-CM | POA: Diagnosis not present

## 2021-05-15 DIAGNOSIS — J189 Pneumonia, unspecified organism: Secondary | ICD-10-CM | POA: Diagnosis present

## 2021-05-15 DIAGNOSIS — F419 Anxiety disorder, unspecified: Secondary | ICD-10-CM | POA: Diagnosis present

## 2021-05-15 DIAGNOSIS — F1120 Opioid dependence, uncomplicated: Secondary | ICD-10-CM | POA: Diagnosis present

## 2021-05-15 DIAGNOSIS — Z85118 Personal history of other malignant neoplasm of bronchus and lung: Secondary | ICD-10-CM | POA: Diagnosis not present

## 2021-05-15 DIAGNOSIS — M549 Dorsalgia, unspecified: Secondary | ICD-10-CM | POA: Diagnosis present

## 2021-05-15 DIAGNOSIS — I6389 Other cerebral infarction: Secondary | ICD-10-CM | POA: Diagnosis not present

## 2021-05-15 DIAGNOSIS — R29898 Other symptoms and signs involving the musculoskeletal system: Secondary | ICD-10-CM | POA: Diagnosis not present

## 2021-05-15 DIAGNOSIS — F17210 Nicotine dependence, cigarettes, uncomplicated: Secondary | ICD-10-CM | POA: Diagnosis present

## 2021-05-15 DIAGNOSIS — Z20822 Contact with and (suspected) exposure to covid-19: Secondary | ICD-10-CM | POA: Diagnosis present

## 2021-05-15 DIAGNOSIS — E785 Hyperlipidemia, unspecified: Secondary | ICD-10-CM | POA: Diagnosis present

## 2021-05-15 HISTORY — DX: Acute respiratory failure with hypoxia: J96.01

## 2021-05-15 LAB — COMPREHENSIVE METABOLIC PANEL
ALT: 51 U/L — ABNORMAL HIGH (ref 0–44)
AST: 45 U/L — ABNORMAL HIGH (ref 15–41)
Albumin: 2.6 g/dL — ABNORMAL LOW (ref 3.5–5.0)
Alkaline Phosphatase: 153 U/L — ABNORMAL HIGH (ref 38–126)
Anion gap: 13 (ref 5–15)
BUN: 12 mg/dL (ref 6–20)
CO2: 22 mmol/L (ref 22–32)
Calcium: 9.2 mg/dL (ref 8.9–10.3)
Chloride: 102 mmol/L (ref 98–111)
Creatinine, Ser: 0.75 mg/dL (ref 0.44–1.00)
GFR, Estimated: >60 mL/min (ref >60)
Glucose, Bld: 108 mg/dL — ABNORMAL HIGH (ref 70–99)
Potassium: 5.8 mmol/L — ABNORMAL HIGH (ref 3.5–5.1)
Sodium: 137 mmol/L (ref 135–145)
Total Bilirubin: 1.7 mg/dL — ABNORMAL HIGH (ref 0.3–1.2)
Total Protein: 6.7 g/dL (ref 6.5–8.1)

## 2021-05-15 LAB — CBC WITH DIFFERENTIAL/PLATELET
Abs Immature Granulocytes: 0.29 10*3/uL — ABNORMAL HIGH (ref 0.00–0.07)
Basophils Absolute: 0.1 10*3/uL (ref 0.0–0.1)
Basophils Relative: 0 %
Eosinophils Absolute: 0.1 10*3/uL (ref 0.0–0.5)
Eosinophils Relative: 1 %
HCT: 41.5 % (ref 36.0–46.0)
Hemoglobin: 14 g/dL (ref 12.0–15.0)
Immature Granulocytes: 2 %
Lymphocytes Relative: 22 %
Lymphs Abs: 3.5 10*3/uL (ref 0.7–4.0)
MCH: 32.7 pg (ref 26.0–34.0)
MCHC: 33.7 g/dL (ref 30.0–36.0)
MCV: 97 fL (ref 80.0–100.0)
Monocytes Absolute: 1.1 10*3/uL — ABNORMAL HIGH (ref 0.1–1.0)
Monocytes Relative: 7 %
Neutro Abs: 10.5 10*3/uL — ABNORMAL HIGH (ref 1.7–7.7)
Neutrophils Relative %: 68 %
Platelets: 458 10*3/uL — ABNORMAL HIGH (ref 150–400)
RBC: 4.28 MIL/uL (ref 3.87–5.11)
RDW: 14.6 % (ref 11.5–15.5)
WBC: 15.5 10*3/uL — ABNORMAL HIGH (ref 4.0–10.5)
nRBC: 0 % (ref 0.0–0.2)

## 2021-05-15 LAB — BRAIN NATRIURETIC PEPTIDE: B Natriuretic Peptide: 24.6 pg/mL (ref 0.0–100.0)

## 2021-05-15 LAB — TROPONIN I (HIGH SENSITIVITY)
Troponin I (High Sensitivity): 4 ng/L (ref <18)
Troponin I (High Sensitivity): 6 ng/L (ref <18)

## 2021-05-15 LAB — URINALYSIS, ROUTINE W REFLEX MICROSCOPIC
Bilirubin Urine: NEGATIVE
Glucose, UA: NEGATIVE mg/dL
Hgb urine dipstick: NEGATIVE
Ketones, ur: NEGATIVE mg/dL
Leukocytes,Ua: NEGATIVE
Nitrite: NEGATIVE
Protein, ur: NEGATIVE mg/dL
Specific Gravity, Urine: 1.01 (ref 1.005–1.030)
pH: 7 (ref 5.0–8.0)

## 2021-05-15 LAB — BASIC METABOLIC PANEL
Anion gap: 8 (ref 5–15)
BUN: 8 mg/dL (ref 6–20)
CO2: 27 mmol/L (ref 22–32)
Calcium: 8.7 mg/dL — ABNORMAL LOW (ref 8.9–10.3)
Chloride: 105 mmol/L (ref 98–111)
Creatinine, Ser: 0.67 mg/dL (ref 0.44–1.00)
GFR, Estimated: >60 mL/min (ref >60)
Glucose, Bld: 104 mg/dL — ABNORMAL HIGH (ref 70–99)
Potassium: 4.2 mmol/L (ref 3.5–5.1)
Sodium: 140 mmol/L (ref 135–145)

## 2021-05-15 LAB — APTT: aPTT: 25 seconds (ref 24–36)

## 2021-05-15 LAB — RAPID URINE DRUG SCREEN, HOSP PERFORMED
Amphetamines: NOT DETECTED
Barbiturates: NOT DETECTED
Benzodiazepines: NOT DETECTED
Cocaine: NOT DETECTED
Opiates: POSITIVE — AB
Tetrahydrocannabinol: NOT DETECTED

## 2021-05-15 LAB — PROTIME-INR
INR: 0.9 (ref 0.8–1.2)
Prothrombin Time: 11.9 seconds (ref 11.4–15.2)

## 2021-05-15 LAB — RESP PANEL BY RT-PCR (FLU A&B, COVID) ARPGX2
Influenza A by PCR: NEGATIVE
Influenza B by PCR: NEGATIVE
SARS Coronavirus 2 by RT PCR: NEGATIVE

## 2021-05-15 LAB — LACTIC ACID, PLASMA
Lactic Acid, Venous: 1 mmol/L (ref 0.5–1.9)
Lactic Acid, Venous: 1.9 mmol/L (ref 0.5–1.9)

## 2021-05-15 MED ORDER — METHYLPREDNISOLONE SODIUM SUCC 125 MG IJ SOLR
80.0000 mg | Freq: Two times a day (BID) | INTRAMUSCULAR | Status: DC
  Administered 2021-05-15 (×2): 80 mg via INTRAVENOUS
  Filled 2021-05-15 (×2): qty 2

## 2021-05-15 MED ORDER — OXYCODONE HCL 5 MG PO TABS
5.0000 mg | ORAL_TABLET | Freq: Four times a day (QID) | ORAL | Status: DC | PRN
  Administered 2021-05-15: 5 mg via ORAL
  Filled 2021-05-15: qty 1

## 2021-05-15 MED ORDER — ALPRAZOLAM 0.5 MG PO TABS
0.2500 mg | ORAL_TABLET | Freq: Every evening | ORAL | Status: DC | PRN
  Administered 2021-05-17: 0.25 mg via ORAL
  Filled 2021-05-15: qty 1

## 2021-05-15 MED ORDER — LORAZEPAM 2 MG/ML IJ SOLN
1.0000 mg | Freq: Once | INTRAMUSCULAR | Status: AC | PRN
  Administered 2021-05-15: 1 mg via INTRAVENOUS
  Filled 2021-05-15: qty 1

## 2021-05-15 MED ORDER — ALPRAZOLAM 0.25 MG PO TABS
0.5000 mg | ORAL_TABLET | Freq: Once | ORAL | Status: AC
  Administered 2021-05-15: 0.5 mg via ORAL
  Filled 2021-05-15: qty 2

## 2021-05-15 MED ORDER — IOHEXOL 350 MG/ML SOLN
75.0000 mL | Freq: Once | INTRAVENOUS | Status: AC | PRN
  Administered 2021-05-15: 75 mL via INTRAVENOUS

## 2021-05-15 MED ORDER — NICOTINE 21 MG/24HR TD PT24
21.0000 mg | MEDICATED_PATCH | Freq: Every day | TRANSDERMAL | Status: DC
  Administered 2021-05-15 – 2021-05-17 (×3): 21 mg via TRANSDERMAL
  Filled 2021-05-15 (×3): qty 1

## 2021-05-15 MED ORDER — VANCOMYCIN HCL 500 MG/100ML IV SOLN
500.0000 mg | Freq: Two times a day (BID) | INTRAVENOUS | Status: DC
  Administered 2021-05-15 – 2021-05-17 (×5): 500 mg via INTRAVENOUS
  Filled 2021-05-15 (×6): qty 100

## 2021-05-15 MED ORDER — MONTELUKAST SODIUM 10 MG PO TABS
10.0000 mg | ORAL_TABLET | Freq: Every day | ORAL | Status: DC
  Administered 2021-05-15 – 2021-05-17 (×2): 10 mg via ORAL
  Filled 2021-05-15 (×2): qty 1

## 2021-05-15 MED ORDER — GADOBUTROL 1 MMOL/ML IV SOLN
5.0000 mL | Freq: Once | INTRAVENOUS | Status: AC | PRN
  Administered 2021-05-15: 5 mL via INTRAVENOUS

## 2021-05-15 MED ORDER — AMITRIPTYLINE HCL 75 MG PO TABS
150.0000 mg | ORAL_TABLET | Freq: Every day | ORAL | Status: DC
  Filled 2021-05-15 (×4): qty 2

## 2021-05-15 MED ORDER — ALBUTEROL SULFATE (2.5 MG/3ML) 0.083% IN NEBU: 2.5000 mg | INHALATION_SOLUTION | RESPIRATORY_TRACT | Status: DC | PRN

## 2021-05-15 MED ORDER — AMITRIPTYLINE HCL 25 MG PO TABS
150.0000 mg | ORAL_TABLET | Freq: Every day | ORAL | Status: DC
  Administered 2021-05-15: 150 mg via ORAL
  Filled 2021-05-15: qty 6

## 2021-05-15 MED ORDER — OXYCODONE-ACETAMINOPHEN 10-325 MG PO TABS: 1.0000 | ORAL_TABLET | Freq: Four times a day (QID) | ORAL | Status: DC | PRN

## 2021-05-15 MED ORDER — GABAPENTIN 300 MG PO CAPS
900.0000 mg | ORAL_CAPSULE | Freq: Three times a day (TID) | ORAL | Status: DC
  Administered 2021-05-15 – 2021-05-17 (×8): 900 mg via ORAL
  Filled 2021-05-15 (×8): qty 3

## 2021-05-15 MED ORDER — OXYCODONE HCL ER 15 MG PO T12A
30.0000 mg | EXTENDED_RELEASE_TABLET | Freq: Two times a day (BID) | ORAL | Status: DC
  Administered 2021-05-15 – 2021-05-17 (×5): 30 mg via ORAL
  Filled 2021-05-15 (×5): qty 2

## 2021-05-15 MED ORDER — NICOTINE 21 MG/24HR TD PT24: 21.0000 mg | MEDICATED_PATCH | Freq: Every day | TRANSDERMAL | Status: DC

## 2021-05-15 MED ORDER — ALBUTEROL SULFATE HFA 108 (90 BASE) MCG/ACT IN AERS
2.0000 | INHALATION_SPRAY | RESPIRATORY_TRACT | Status: DC | PRN
  Administered 2021-05-15 (×2): 2 via RESPIRATORY_TRACT
  Filled 2021-05-15: qty 6.7

## 2021-05-15 MED ORDER — IPRATROPIUM-ALBUTEROL 0.5-2.5 (3) MG/3ML IN SOLN
3.0000 mL | Freq: Four times a day (QID) | RESPIRATORY_TRACT | Status: DC
  Administered 2021-05-15: 3 mL via RESPIRATORY_TRACT
  Filled 2021-05-15: qty 3

## 2021-05-15 MED ORDER — METOCLOPRAMIDE HCL 10 MG PO TABS: 10.0000 mg | ORAL_TABLET | Freq: Every day | ORAL | Status: DC | PRN

## 2021-05-15 MED ORDER — OXYCODONE-ACETAMINOPHEN 5-325 MG PO TABS
1.0000 | ORAL_TABLET | Freq: Four times a day (QID) | ORAL | Status: DC | PRN
  Administered 2021-05-15: 1 via ORAL
  Filled 2021-05-15: qty 1

## 2021-05-15 MED ORDER — ACETAMINOPHEN 650 MG RE SUPP: 650.0000 mg | Freq: Four times a day (QID) | RECTAL | Status: DC | PRN

## 2021-05-15 MED ORDER — SODIUM CHLORIDE 0.9 % IV SOLN
2.0000 g | Freq: Three times a day (TID) | INTRAVENOUS | Status: DC
  Administered 2021-05-15 – 2021-05-17 (×7): 2 g via INTRAVENOUS
  Filled 2021-05-15 (×7): qty 2

## 2021-05-15 MED ORDER — ACETAMINOPHEN 325 MG PO TABS
650.0000 mg | ORAL_TABLET | Freq: Four times a day (QID) | ORAL | Status: DC | PRN
  Administered 2021-05-15: 650 mg via ORAL
  Filled 2021-05-15: qty 2

## 2021-05-15 NOTE — Sepsis Progress Note (Signed)
Sepsis protocol is being followed by [facility]. 

## 2021-05-15 NOTE — ED Notes (Signed)
Admitting doctor paged to RN per her request

## 2021-05-15 NOTE — Plan of Care (Signed)

## 2021-05-15 NOTE — Plan of Care (Signed)
°  Problem: Education: Goal: Knowledge of General Education information will improve Description: Including pain rating scale, medication(s)/side effects and non-pharmacologic comfort measures 05/15/2021 1834 by Shea Evans D, RN Outcome: Progressing 05/15/2021 1834 by Jodell Cipro, RN Outcome: Progressing   Problem: Health Behavior/Discharge Planning: Goal: Ability to manage health-related needs will improve 05/15/2021 1834 by Shea Evans D, RN Outcome: Progressing 05/15/2021 1834 by Shea Evans D, RN Outcome: Progressing   Problem: Clinical Measurements: Goal: Ability to maintain clinical measurements within normal limits will improve 05/15/2021 1834 by Shea Evans D, RN Outcome: Progressing 05/15/2021 1834 by Shea Evans D, RN Outcome: Progressing Goal: Will remain free from infection 05/15/2021 1834 by Shea Evans D, RN Outcome: Progressing 05/15/2021 1834 by Shea Evans D, RN Outcome: Progressing Goal: Diagnostic test results will improve 05/15/2021 1834 by Shea Evans D, RN Outcome: Progressing 05/15/2021 1834 by Shea Evans D, RN Outcome: Progressing Goal: Respiratory complications will improve 05/15/2021 1834 by Shea Evans D, RN Outcome: Progressing 05/15/2021 1834 by Shea Evans D, RN Outcome: Progressing Goal: Cardiovascular complication will be avoided 05/15/2021 1834 by Shea Evans D, RN Outcome: Progressing 05/15/2021 1834 by Shea Evans D, RN Outcome: Progressing   Problem: Activity: Goal: Risk for activity intolerance will decrease 05/15/2021 1834 by Shea Evans D, RN Outcome: Progressing 05/15/2021 1834 by Shea Evans D, RN Outcome: Progressing   Problem: Nutrition: Goal: Adequate nutrition will be maintained 05/15/2021 1834 by Shea Evans D, RN Outcome: Progressing 05/15/2021 1834 by Shea Evans D, RN Outcome:  Progressing   Problem: Coping: Goal: Level of anxiety will decrease 05/15/2021 1834 by Shea Evans D, RN Outcome: Progressing 05/15/2021 1834 by Shea Evans D, RN Outcome: Progressing   Problem: Elimination: Goal: Will not experience complications related to bowel motility 05/15/2021 1834 by Shea Evans D, RN Outcome: Progressing 05/15/2021 1834 by Shea Evans D, RN Outcome: Progressing Goal: Will not experience complications related to urinary retention 05/15/2021 1834 by Shea Evans D, RN Outcome: Progressing 05/15/2021 1834 by Shea Evans D, RN Outcome: Progressing   Problem: Pain Managment: Goal: General experience of comfort will improve 05/15/2021 1834 by Shea Evans D, RN Outcome: Progressing 05/15/2021 1834 by Shea Evans D, RN Outcome: Progressing   Problem: Safety: Goal: Ability to remain free from injury will improve 05/15/2021 1834 by Shea Evans D, RN Outcome: Progressing 05/15/2021 1834 by Shea Evans D, RN Outcome: Progressing   Problem: Skin Integrity: Goal: Risk for impaired skin integrity will decrease 05/15/2021 1834 by Shea Evans D, RN Outcome: Progressing 05/15/2021 1834 by Jodell Cipro, RN Outcome: Progressing

## 2021-05-15 NOTE — ED Notes (Signed)
Admitting paged to RN per her request 

## 2021-05-15 NOTE — Sepsis Progress Note (Signed)
Following up with bedside nurse regarding obtaining results for the lactic acid.

## 2021-05-15 NOTE — ED Notes (Signed)
Spoke with the admitting doctor about ordering home medications. No new orders at this time

## 2021-05-15 NOTE — ED Notes (Signed)
Patient transported to MRI 

## 2021-05-15 NOTE — ED Notes (Signed)
ED TO INPATIENT HANDOFF REPORT  ED Nurse Name and Phone #: Cindie Laroche [phone]  S Name/Age/Gender Courtney Mayer 51 y.o. female Room/Bed: 046C/046C  Code Status   Code Status: Full Code  Home/SNF/Other Home Patient oriented to: self, place, time, and situation Is this baseline? Yes   Triage Complete: Triage complete  Chief Complaint Acute exacerbation of chronic obstructive pulmonary disease (COPD) ([place]) [J44.1]  Triage Note Pt BIB EMS for right arm weakness since about 2030 tonight. Pt diagnoses with pneumonia last wednesday but states she feels more short of breathe today. NIH 0 with EMS   End tidal  27 28-30 RR  130/80  HR 110-120 NSR    Allergies Allergies  Allergen Reactions   Tramadol Palpitations    Can't take Ultram ER - palpitations  Regular Tramadol is fine to take Can't take Ultram ER - palpitations  Regular Tramadol is fine to take    Amoxicillin Diarrhea   Compazine [Prochlorperazine] Other (See Comments)    Excitability   Compazine [Prochlorperazine] Anxiety    Level of Care/Admitting Diagnosis ED Disposition     ED Disposition  Admit   Condition  --   Comment  Hospital Area: [facility] [[id number]]  Level of Care: Telemetry Medical [104]  May admit patient to [facility] or [facility] if equivalent level of care is available:: No  Covid Evaluation: Confirmed COVID Negative  Diagnosis: Acute exacerbation of chronic obstructive pulmonary disease (COPD) [facility]) [[id number]]  Admitting Physician: Rhetta Mura [[id number]]  Attending Physician: Rhetta Mura [[id number]]  Estimated length of stay: past midnight tomorrow  Certification:: I certify this patient will need inpatient services for at least 2 midnights          B Medical/Surgery History Past Medical History:  Diagnosis Date   Back pain    Cancer ([place])    lung   COPD (chronic obstructive pulmonary disease) ([place])    Past Surgical History:  Procedure  Laterality Date   CESAREAN SECTION     LOBECTOMY     LUNG SURGERY Left    WRIST FRACTURE SURGERY       A IV Location/Drains/Wounds Patient Lines/Drains/Airways Status     Active Line/Drains/Airways     Name Placement date Placement time Site Days   Peripheral IV 05/14/21 20 G Left Wrist 05/14/21  2353  Wrist  1   Peripheral IV 05/15/21 20 G Anterior;Left Forearm 05/15/21  0115  Forearm  less than 1            Intake/Output Last 24 hours  Intake/Output Summary (Last 24 hours) at 05/15/2021 1639 Last data filed at 05/15/2021 6295 Gross per 24 hour  Intake 1000 ml  Output --  Net 1000 ml    Labs/Imaging Results for orders placed or performed during the hospital encounter of 05/14/21 (from the past 48 hour(s))  Lactic acid, plasma     Status: None   Collection Time: 05/14/21  1:12 AM  Result Value Ref Range   Lactic Acid, Venous 1.0 0.5 - 1.9 mmol/L    Comment: Performed at [facility], [address], [place], [place] [address]  Comprehensive metabolic panel     Status: Abnormal   Collection Time: 05/14/21 11:47 PM  Result Value Ref Range   Sodium 137 135 - 145 mmol/L   Potassium 5.8 (H) 3.5 - 5.1 mmol/L   Chloride 102 98 - 111 mmol/L   CO2 22 22 - 32 mmol/L   Glucose, Bld 108 (  H) 70 - 99 mg/dL    Comment: Glucose reference range applies only to samples taken after fasting for at least 8 hours.   BUN 12 6 - 20 mg/dL   Creatinine, Ser 0.75 0.44 - 1.00 mg/dL   Calcium 9.2 8.9 - 10.3 mg/dL   Total Protein 6.7 6.5 - 8.1 g/dL   Albumin 2.6 (L) 3.5 - 5.0 g/dL   AST 45 (H) 15 - 41 U/L   ALT 51 (H) 0 - 44 U/L   Alkaline Phosphatase 153 (H) 38 - 126 U/L   Total Bilirubin 1.7 (H) 0.3 - 1.2 mg/dL   GFR, Estimated >60 >60 mL/min    Comment: (NOTE) Calculated using the CKD-EPI Creatinine Equation (2021)    Anion gap 13 5 - 15    Comment: Performed at [facility], [place] [address] [place], [place] [address]  CBC WITH DIFFERENTIAL     Status: Abnormal    Collection Time: 05/14/21 11:47 PM  Result Value Ref Range   WBC 15.5 (H) 4.0 - 10.5 K/uL   RBC 4.28 3.87 - 5.11 MIL/uL   Hemoglobin 14.0 12.0 - 15.0 g/dL   HCT 41.5 36.0 - 46.0 %   MCV 97.0 80.0 - 100.0 fL   MCH 32.7 26.0 - 34.0 pg   MCHC 33.7 30.0 - 36.0 g/dL   RDW 14.6 11.5 - 15.5 %   Platelets 458 (H) 150 - 400 K/uL   nRBC 0.0 0.0 - 0.2 %   Neutrophils Relative % 68 %   Neutro Abs 10.5 (H) 1.7 - 7.7 K/uL   Lymphocytes Relative 22 %   Lymphs Abs 3.5 0.7 - 4.0 K/uL   Monocytes Relative 7 %   Monocytes Absolute 1.1 (H) 0.1 - 1.0 K/uL   Eosinophils Relative 1 %   Eosinophils Absolute 0.1 0.0 - 0.5 K/uL   Basophils Relative 0 %   Basophils Absolute 0.1 0.0 - 0.1 K/uL   Immature Granulocytes 2 %   Abs Immature Granulocytes 0.29 (H) 0.00 - 0.07 K/uL    Comment: Performed at [facility], [address], [place], [place] [address]  Protime-INR     Status: None   Collection Time: 05/14/21 11:47 PM  Result Value Ref Range   Prothrombin Time 11.9 11.4 - 15.2 seconds   INR 0.9 0.8 - 1.2    Comment: (NOTE) INR goal varies based on device and disease states. Performed at [facility], [place] [address] [place], [place] [address]   APTT     Status: None   Collection Time: 05/14/21 11:47 PM  Result Value Ref Range   aPTT 25 24 - 36 seconds    Comment: Performed at [facility] [address] [place],  [address]  Urinalysis, Routine w reflex microscopic Nasopharyngeal Swab     Status: None   Collection Time: 05/14/21 11:47 PM  Result Value Ref Range   Color, Urine YELLOW YELLOW   APPearance CLEAR CLEAR   Specific Gravity, Urine 1.010 1.005 - 1.030   pH 7.0 5.0 - 8.0   Glucose, UA NEGATIVE NEGATIVE mg/dL   Hgb urine dipstick NEGATIVE NEGATIVE   Bilirubin Urine NEGATIVE NEGATIVE   Ketones, ur NEGATIVE NEGATIVE mg/dL   Protein, ur NEGATIVE NEGATIVE mg/dL   Nitrite NEGATIVE NEGATIVE   Leukocytes,Ua NEGATIVE NEGATIVE    Comment: Microscopic not done on  urines with negative protein, blood, leukocytes, nitrite, or glucose < 500 mg/dL. Performed at [facility],  [address] [place], [place] [address]   Brain natriuretic peptide     Status: None   Collection Time: 05/14/21 11:47 PM  Result Value Ref Range   B Natriuretic Peptide 24.6 0.0 - 100.0 pg/mL    Comment: Performed at [facility] [address], [place], [place] [address]  Troponin I (High Sensitivity)     Status: None   Collection Time: 05/14/21 11:47 PM  Result Value Ref Range   Troponin I (High Sensitivity) 4 <18 ng/L    Comment: (NOTE) Elevated high sensitivity troponin I (hsTnI) values and significant  changes across serial measurements may suggest ACS but many other  chronic and acute conditions are known to elevate hsTnI results.  Refer to the "Links" section for chest pain algorithms and additional  guidance. Performed at [facility], [place] [address] [place], [place] [address]   Rapid urine drug screen (hospital performed)     Status: Abnormal   Collection Time: 05/14/21 11:47 PM  Result Value Ref Range   Opiates POSITIVE (A) NONE DETECTED   Cocaine NONE DETECTED NONE DETECTED   Benzodiazepines NONE DETECTED NONE DETECTED   Amphetamines NONE DETECTED NONE DETECTED   Tetrahydrocannabinol NONE DETECTED NONE DETECTED   Barbiturates NONE DETECTED NONE DETECTED    Comment: (NOTE) DRUG SCREEN FOR MEDICAL PURPOSES ONLY.  IF CONFIRMATION IS NEEDED FOR ANY PURPOSE, NOTIFY LAB WITHIN 5 DAYS.  LOWEST DETECTABLE LIMITS FOR URINE DRUG SCREEN Drug Class                     Cutoff (ng/mL) Amphetamine and metabolites    1000 Barbiturate and metabolites    200 Benzodiazepine                 664 Tricyclics and metabolites     300 Opiates and metabolites        300 Cocaine and metabolites        300 THC                            50 Performed at [facility], [place] [address] [place], [place] [address]   Blood Culture (routine x 2)     Status:  None (Preliminary result)   Collection Time: 05/15/21 12:55 AM   Specimen: BLOOD  Result Value Ref Range   Specimen Description BLOOD SITE NOT SPECIFIED    Special Requests      BOTTLES DRAWN AEROBIC AND ANAEROBIC Blood Culture adequate volume   Culture      NO GROWTH < 12 HOURS Performed at [facility], [place] [address] [place], [place] [address]    Report Status PENDING   Troponin I (High Sensitivity)     Status: None   Collection Time: 05/15/21  1:48 AM  Result Value Ref Range   Troponin I (High Sensitivity) 6 <18 ng/L    Comment: (NOTE) Elevated high sensitivity troponin I (hsTnI) values and significant  changes across serial measurements may suggest ACS but many other  chronic and acute conditions are known to elevate hsTnI results.  Refer to the "Links" section for chest pain algorithms and additional  guidance. Performed at [facility], [place] [address], [place], [place] [address]   Resp Panel by RT-PCR (Flu A&B, Covid) Nasopharyngeal Swab     Status: None   Collection Time: 05/15/21  2:06 AM   Specimen: Nasopharyngeal Swab; Nasopharyngeal(NP) swabs  in vial transport medium  Result Value Ref Range   SARS Coronavirus 2 by RT PCR NEGATIVE NEGATIVE    Comment: (NOTE) SARS-CoV-2 target nucleic acids are NOT DETECTED.  The SARS-CoV-2 RNA is generally detectable in upper respiratory specimens during the acute phase of infection. The lowest concentration of SARS-CoV-2 viral copies this assay can detect is 138 copies/mL. A negative result does not preclude SARS-Cov-2 infection and should not be used as the sole basis for treatment or other patient management decisions. A negative result may occur with  improper specimen collection/handling, submission of specimen other than nasopharyngeal swab, presence of viral mutation(s) within the areas targeted by this assay, and inadequate number of viral copies(<138 copies/mL). A negative result must be combined  with clinical observations, patient history, and epidemiological information. The expected result is Negative.  Fact Sheet for Patients:  EntrepreneurPulse.com.au  Fact Sheet for Healthcare Providers:  IncredibleEmployment.be  This test is no t yet approved or cleared by the [place] FDA and  has been authorized for detection and/or diagnosis of SARS-CoV-2 by FDA under an Emergency Use Authorization (EUA). This EUA will remain  in effect (meaning this test can be used) for the duration of the COVID-19 declaration under Section 564(b)(1) of the Act, 21 U.S.C.section 360bbb-3(b)(1), unless the authorization is terminated  or revoked sooner.       Influenza A by PCR NEGATIVE NEGATIVE   Influenza B by PCR NEGATIVE NEGATIVE    Comment: (NOTE) The Xpert Xpress SARS-CoV-2/FLU/RSV plus assay is intended as an aid in the diagnosis of influenza from Nasopharyngeal swab specimens and should not be used as a sole basis for treatment. Nasal washings and aspirates are unacceptable for Xpert Xpress SARS-CoV-2/FLU/RSV testing.  Fact Sheet for Patients: EntrepreneurPulse.com.au  Fact Sheet for Healthcare Providers: IncredibleEmployment.be  This test is not yet approved or cleared by the [place] FDA and has been authorized for detection and/or diagnosis of SARS-CoV-2 by FDA under an Emergency Use Authorization (EUA). This EUA will remain in effect (meaning this test can be used) for the duration of the COVID-19 declaration under Section 564(b)(1) of the Act, 21 U.S.C. section 360bbb-3(b)(1), unless the authorization is terminated or revoked.  Performed at [facility], [place] [address], [place], [place] [address]   Blood Culture (routine x 2)     Status: None (Preliminary result)   Collection Time: 05/15/21  3:11 AM   Specimen: BLOOD  Result Value Ref Range   Specimen Description BLOOD RIGHT HAND     Special Requests      BOTTLES DRAWN AEROBIC AND ANAEROBIC Blood Culture adequate volume   Culture      NO GROWTH < 12 HOURS Performed at [facility], [place] [address], [place], [place] [address]    Report Status PENDING   Basic metabolic panel     Status: Abnormal   Collection Time: 05/15/21  9:44 AM  Result Value Ref Range   Sodium 140 135 - 145 mmol/L   Potassium 4.2 3.5 - 5.1 mmol/L   Chloride 105 98 - 111 mmol/L   CO2 27 22 - 32 mmol/L   Glucose, Bld 104 (H) 70 - 99 mg/dL    Comment: Glucose reference range applies only to samples taken after fasting for at least 8 hours.   BUN 8 6 - 20 mg/dL   Creatinine, Ser 0.67 0.44 - 1.00 mg/dL   Calcium 8.7 (L) 8.9 - 10.3 mg/dL   GFR, Estimated >60 >60 mL/min  Comment: (NOTE) Calculated using the CKD-EPI Creatinine Equation (2021)    Anion gap 8 5 - 15    Comment: Performed at [facility], [place] [address] [place], [place] [address]   CT ANGIO HEAD NECK W WO CM  Result Date: 05/15/2021 CLINICAL DATA:  Initial evaluation for neuro deficit, stroke. EXAM: CT ANGIOGRAPHY HEAD AND NECK TECHNIQUE: Multidetector CT imaging of the head and neck was performed using the standard protocol during bolus administration of intravenous contrast. Multiplanar CT image reconstructions and MIPs were obtained to evaluate the vascular anatomy. Carotid stenosis measurements (when applicable) are obtained utilizing NASCET criteria, using the distal internal carotid diameter as the denominator. RADIATION DOSE REDUCTION: This exam was performed according to the departmental dose-optimization program which includes automated exposure control, adjustment of the mA and/or kV according to patient size and/or use of iterative reconstruction technique. CONTRAST:  62mL OMNIPAQUE IOHEXOL 350 MG/ML SOLN COMPARISON:  Prior study from 01/15/2021. FINDINGS: CT HEAD FINDINGS Brain: Examination degraded by motion artifact. Cerebral volume within normal limits. No acute  intracranial hemorrhage. No visible acute large vessel territory infarct. No mass lesion, midline shift or mass effect. No hydrocephalus or extra-axial fluid collection. Vascular: No visible hyperdense vessel. Skull: Scalp soft tissues demonstrate no acute finding. Calvarium grossly intact. Sinuses: Paranasal sinuses and mastoid air cells are grossly clear. Orbits: No obvious abnormality about the globes orbital soft tissues. Review of the MIP images confirms the above findings CTA NECK FINDINGS Aortic arch: A shin examination degraded by motion. Visualized aortic arch normal caliber with normal branch pattern. Mild plaque within the arch itself. No stenosis about the origin the great vessels. Right carotid system: Right common and internal carotid arteries patent without stenosis or dissection. Left carotid system: Left CCA patent from its origin to the bifurcation without stenosis. Eccentric soft plaque at the left carotid bulb/proximal left ICA with associated stenosis of up to approximately 50% by NASCET criteria. Left ICA patent distally without stenosis or dissection. Vertebral arteries: Both vertebral arteries arise from subclavian arteries. No proximal subclavian artery stenosis. Right vertebral artery dominant. Vertebral arteries patent without stenosis or dissection. Skeleton: No discrete or worrisome osseous lesions. Mild chronic height loss noted at the superior endplates of T2 through T4. Other neck: No other acute soft tissue abnormality within the neck. Upper chest: Scattered patchy multifocal opacities seen within the partially visualized left lung, suspicious for possible infection/pneumonia. Underlying centrilobular emphysema. Review of the MIP images confirms the above findings CTA HEAD FINDINGS Anterior circulation: Both internal carotid arteries patent to the termini without stenosis. A1 segments patent bilaterally. Normal anterior communicating artery complex. Anterior cerebral arteries widely  patent bilaterally. No M1 stenosis or occlusion. 4 mm saccular aneurysm seen at the left MCA bifurcation (series 11, image 102). 3 mm saccular aneurysm present at the contralateral right MCA bifurcation (series 11, image 104). Distal MCA branches well perfused and symmetric. Additional Posterior circulation: Both V4 segments patent to the vertebrobasilar junction without stenosis. Both PICA origins patent and normal. Basilar patent to its distal aspect without stenosis. Superior cerebellar and posterior cerebral arteries widely patent bilaterally. Venous sinuses: Patent allowing for timing of contrast bolus. Anatomic variants: None significant. Review of the MIP images confirms the above findings IMPRESSION: CT HEAD IMPRESSION: 1. Motion degraded exam. 2. No acute intracranial abnormality. CTA HEAD AND NECK IMPRESSION: 1. Negative CTA for large vessel occlusion. 2. 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 3. 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 4. Patchy multifocal opacities within the partially visualized left lung, suspicious for infection/pneumonia. 5. Emphysema (ICD10-J43.9). Electronically Signed   By: Jeannine Boga M.D.   On: 05/15/2021 04:39   MR Brain W and Wo Contrast  Result Date: 05/15/2021 CLINICAL DATA:  Right arm weakness since 10/30 last night EXAM: MRI HEAD WITHOUT AND WITH CONTRAST TECHNIQUE: Multiplanar, multiecho pulse sequences of the brain and surrounding structures were obtained without and with intravenous contrast. CONTRAST:  63mL GADAVIST GADOBUTROL 1 MMOL/ML IV SOLN COMPARISON:  Same-day CT/CTA head and neck FINDINGS: Brain: There are multiple small infarcts in the left frontal and parietal lobes involving the corona radiata, parietal lobe cortex, and pre and postcentral gyri. There is no associated hemorrhage or mass effect. There is no acute intracranial hemorrhage or extra-axial fluid collection. Background  parenchymal volume is normal. The ventricles are normal in size. Parenchymal signal is otherwise normal. There is no mass lesion or abnormal enhancement. There is no midline shift. Vascular: Normal flow voids. Skull and upper cervical spine: Normal marrow signal. Sinuses/Orbits: The paranasal sinuses are clear. The globes and orbits are unremarkable. Other: None. 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 Electronically Signed   By: Valetta Mole M.D.   On: 05/15/2021 08:12   DG Chest Port 1 View  Result Date: 05/15/2021 CLINICAL DATA:  Possible sepsis.  Right arm pain. EXAM: PORTABLE CHEST 1 VIEW COMPARISON:  01/15/2021. FINDINGS: The heart size and mediastinal contours are stable. Apical pleural scarring and postsurgical changes are noted in the left upper lobe. There is elevation of the left diaphragm with patchy interstitial and airspace opacities in the mid to lower left lung field. The right lung is clear. No effusion or pneumothorax. No acute osseous abnormality. IMPRESSION: 1. Interstitial and airspace opacities in the mid to lower left lung field, possible developing pneumonia. 2. Stable postsurgical changes and apical pleural thickening on the left. Electronically Signed   By: Brett Fairy M.D.   On: 05/15/2021 00:22    Pending Labs Unresulted Labs (From admission, onward)     Start     Ordered   05/16/21 0500  HIV Antibody (routine testing w rflx)  (HIV Antibody (Routine testing w reflex) panel)  Tomorrow morning,   R        05/15/21 0503   05/14/21 2347  Lactic acid, plasma  (Septic presentation on arrival (screening labs, nursing and treatment orders for obvious sepsis))  Now then every 2 hours,   STAT      05/14/21 2348   05/14/21 2347  Urine Culture  (Septic presentation on arrival (screening labs, nursing and treatment orders for obvious  sepsis))  ONCE - STAT,   STAT       Question:  Indication  Answer:  Sepsis   05/14/21 2348            Vitals/Pain Today's Vitals   05/15/21 1107 05/15/21 1200 05/15/21 1300 05/15/21 1600  BP:  130/87 (!) 130/92 112/73  Pulse:  (!) 105 (!) 103 (!) 107  Resp:  18 (!) 23 18  Temp:      TempSrc:      SpO2:  92% 94% 96%  Weight:      Height:      PainSc: Asleep       Isolation Precautions No active  isolations  Medications Medications  lactated ringers infusion (0 mLs Intravenous Paused 05/15/21 0627)  ceFEPIme (MAXIPIME) 2 g in sodium chloride 0.9 % 100 mL IVPB (0 g Intravenous Stopped 05/15/21 1025)  vancomycin (VANCOREADY) IVPB 500 mg/100 mL (0 mg Intravenous Stopped 05/15/21 1219)  nicotine (NICODERM CQ - dosed in mg/24 hours) patch 21 mg (21 mg Transdermal Patch Applied 05/15/21 0837)  acetaminophen (TYLENOL) tablet 650 mg (650 mg Oral Given 05/15/21 0836)    Or  acetaminophen (TYLENOL) suppository 650 mg ( Rectal See Alternative 05/15/21 0836)  ipratropium-albuterol (DUONEB) 0.5-2.5 (3) MG/3ML nebulizer solution 3 mL (3 mLs Nebulization Patient Refused/Not Given 05/15/21 1307)  albuterol (PROVENTIL) (2.5 MG/3ML) 0.083% nebulizer solution 2.5 mg (has no administration in time range)  methylPREDNISolone sodium succinate (SOLU-MEDROL) 125 mg/2 mL injection 80 mg (80 mg Intravenous Given 05/15/21 0946)  ALPRAZolam (XANAX) tablet 0.25 mg (has no administration in time range)  amitriptyline (ELAVIL) tablet 150 mg (has no administration in time range)  gabapentin (NEURONTIN) capsule 900 mg (900 mg Oral Given 05/15/21 1527)  metoCLOPramide (REGLAN) tablet 10 mg (has no administration in time range)  montelukast (SINGULAIR) tablet 10 mg (has no administration in time range)  oxyCODONE (OXYCONTIN) 12 hr tablet 30 mg (30 mg Oral Given 05/15/21 0946)  oxyCODONE-acetaminophen (PERCOCET/ROXICET) 5-325 MG per tablet 1 tablet (1 tablet Oral Given 05/15/21 0947)    And  oxyCODONE (Oxy  IR/ROXICODONE) immediate release tablet 5 mg (has no administration in time range)  albuterol (VENTOLIN HFA) 108 (90 Base) MCG/ACT inhaler 2 puff (2 puffs Inhalation Given 05/15/21 1351)  vancomycin (VANCOCIN) IVPB 1000 mg/200 mL premix (0 mg Intravenous Stopped 05/15/21 0350)  ceFEPIme (MAXIPIME) 2 g in sodium chloride 0.9 % 100 mL IVPB (0 g Intravenous Stopped 05/15/21 0504)  lactated ringers bolus 1,000 mL (0 mLs Intravenous Stopped 05/15/21 0350)    And  lactated ringers bolus 500 mL (0 mLs Intravenous Stopped 05/15/21 0609)    And  lactated ringers bolus 250 mL (0 mLs Intravenous Stopped 05/15/21 0609)  iohexol (OMNIPAQUE) 350 MG/ML injection 75 mL (75 mLs Intravenous Contrast Given 05/15/21 0238)  ALPRAZolam (XANAX) tablet 0.5 mg (0.5 mg Oral Given 05/15/21 0449)  LORazepam (ATIVAN) injection 1 mg (1 mg Intravenous Given 05/15/21 0623)  gadobutrol (GADAVIST) 1 MMOL/ML injection 5 mL (5 mLs Intravenous Contrast Given 05/15/21 0730)    Mobility walks     Focused Assessments Pulmonary Assessment Handoff:  Lung sounds:   O2 Device: Room Air      R Recommendations: See Admitting Provider Note  Report given to:   Additional Notes: none

## 2021-05-15 NOTE — H&P (Signed)
History and Physical    Courtney Mayer SWF:[id number] DOB: [birth year] DOA: 05/14/2021  PCP: Gildardo Cranker, MD  Patient coming from: Home  Chief Complaint: Shortness of breath and wheezing and cough  HPI: Courtney Mayer is a 51 y.o. female with medical history significant of COPD, tobacco abuse, chronic pain comes to the emergency department because of shortness of breath and right upper extremity weakness.  Patient reports she was diagnosed with pneumonia last week was started on Levaquin has taken that for the last 5 days along with steroids and is not feeling better.  On arrival her O2 sats were in the 80s on room air however she is currently not on any oxygen supplementation and breathing fine.  She did run a fever last Friday but none recently.  She also is complaining of some right upper extremity weakness that started last night with no other associated neurological symptoms.  She denies any nausea vomiting or diarrhea.  She does have a history of remotely of lung cancer.  Patient is being referred for admission for COPD exacerbation and stroke work-up.  Review of Systems: As per HPI otherwise 10 point review of systems negative.   Past Medical History:  Diagnosis Date   Back pain    Cancer ([place])    lung   COPD (chronic obstructive pulmonary disease) ([place])     Past Surgical History:  Procedure Laterality Date   CESAREAN SECTION     LOBECTOMY     LUNG SURGERY Left    WRIST FRACTURE SURGERY       reports that she has been smoking cigarettes. She has been smoking an average of 1 pack per day. She has never used smokeless tobacco. She reports that she does not currently use alcohol. She reports that she does not use drugs.  Allergies  Allergen Reactions   Tramadol Palpitations    Can't take Ultram ER - palpitations  Regular Tramadol is fine to take Can't take Ultram ER - palpitations  Regular Tramadol is fine to take    Amoxicillin Diarrhea   Compazine  [Prochlorperazine] Other (See Comments)    Excitability   Compazine [Prochlorperazine] Anxiety    Family History  Problem Relation Age of Onset   Healthy Mother    Healthy Father     Prior to Admission medications   Medication Sig Start Date End Date Taking? Authorizing Provider  albuterol (VENTOLIN HFA) 108 (90 Base) MCG/ACT inhaler Inhale 2 puffs into the lungs every 6 (six) hours as needed for wheezing or shortness of breath.   Yes [provider]  ALPRAZolam (XANAX) 0.25 MG tablet Take 0.25 mg by mouth at bedtime as needed for anxiety.   Yes [provider]  amitriptyline (ELAVIL) 150 MG tablet Take 150 mg by mouth at bedtime. 12/31/20  Yes [provider]  gabapentin (NEURONTIN) 600 MG tablet Take 900 mg by mouth 3 (three) times daily. 01/12/21  Yes [provider]  LATUDA 60 MG TABS Take 60 mg by mouth daily. 01/03/21  Yes [provider]  levofloxacin (LEVAQUIN) 750 MG tablet Take 750 mg by mouth daily. 05/08/21  Yes [provider]  methocarbamol (ROBAXIN) 750 MG tablet Take 1,500 mg by mouth 4 (four) times daily. 01/01/21  Yes [provider]  metoCLOPramide (REGLAN) 10 MG tablet Take 10 mg by mouth daily as needed for nausea.   Yes [provider]  montelukast (SINGULAIR) 10 MG tablet Take 10 mg by mouth at bedtime.   Yes [provider]  Multiple Vitamin (MULTIVITAMIN WITH MINERALS) TABS tablet Take 1 tablet by mouth daily.   Yes [provider]  oxyCODONE-acetaminophen (PERCOCET) 10-325 MG tablet Take 1 tablet by mouth 4 (four) times daily as needed for pain. 01/15/21  Yes [provider]  OXYCONTIN 30 MG 12 hr tablet Take 30 tablets by mouth 2 (two) times daily. 01/15/21  Yes [provider]  pantoprazole (PROTONIX) 40 MG tablet Take 40 mg by mouth daily. 01/14/21  Yes [provider]  predniSONE (DELTASONE) 10 MG tablet Take 10 mg by mouth 2 (two) times daily. 05/08/21  Yes  [provider]  QUEtiapine (SEROQUEL) 50 MG tablet Take 50 mg by mouth at bedtime. 05/13/21  Yes [provider]  amitriptyline (ELAVIL) 50 MG tablet Take 50 mg by mouth at bedtime. Patient not taking: Reported on 05/15/2021    [provider]  lurasidone (LATUDA) 40 MG TABS tablet Take 60 mg by mouth at bedtime. Patient not taking: Reported on 05/15/2021    [provider]  oxyCODONE (ROXICODONE) 15 MG immediate release tablet Take 20 mg by mouth every 4 (four) hours as needed for pain. Patient not taking: Reported on 05/15/2021    [provider]  TRELEGY ELLIPTA 100-62.5-25 MCG/INH AEPB Inhale 1 puff into the lungs daily. 01/15/21   [provider]    Physical Exam: Vitals:   05/15/21 0334 05/15/21 0415 05/15/21 0445 05/15/21 0600  BP:  (!) 134/99 (!) 136/96 130/87  Pulse: 95 91  87  Resp: 20 20 (!) 25 (!) 22  Temp:      TempSrc:      SpO2: 92% 92%  95%  Weight:      Height:          Constitutional: NAD, calm, comfortable Vitals:   05/15/21 0334 05/15/21 0415 05/15/21 0445 05/15/21 0600  BP:  (!) 134/99 (!) 136/96 130/87  Pulse: 95 91  87  Resp: 20 20 (!) 25 (!) 22  Temp:      TempSrc:      SpO2: 92% 92%  95%  Weight:      Height:       Eyes: PERRL, lids and conjunctivae normal ENMT: Mucous membranes are moist. Posterior pharynx clear of any exudate or lesions.Normal dentition.  Neck: normal, supple, no masses, no thyromegaly Respiratory: clear to auscultation bilaterally, no wheezing, no crackles. Normal respiratory effort. No accessory muscle use.  Cardiovascular: Regular rate and rhythm, no murmurs / rubs / gallops. No extremity edema. 2+ pedal pulses. No carotid bruits.  Abdomen: no tenderness, no masses palpated. No hepatosplenomegaly. Bowel sounds positive.  Musculoskeletal: no clubbing / cyanosis. No joint deformity upper and lower extremities. Good ROM, no contractures. Normal muscle tone.  Skin: no rashes,  lesions, ulcers. No induration Neurologic: CN 2-12 grossly intact. Sensation intact, DTR normal. Strength 5/5 in all 4.  Psychiatric: Normal judgment and insight. Alert and oriented x 3. Normal mood.    Labs on Admission: I have personally reviewed following labs and imaging studies  CBC: Recent Labs  Lab 05/14/21 2347  WBC 15.5*  NEUTROABS 10.5*  HGB 14.0  HCT 41.5  MCV 97.0  PLT 951*   Basic Metabolic Panel: Recent Labs  Lab 05/14/21 2347  NA 137  K 5.8*  CL 102  CO2 22  GLUCOSE 108*  BUN 12  CREATININE 0.75  CALCIUM 9.2   GFR: Estimated Creatinine Clearance: 66 mL/min (by C-G formula based on SCr of 0.75 mg/dL). Liver Function  Tests: Recent Labs  Lab 05/14/21 2347  AST 45*  ALT 51*  ALKPHOS 153*  BILITOT 1.7*  PROT 6.7  ALBUMIN 2.6*   No results for input(s): LIPASE, AMYLASE in the last 168 hours. No results for input(s): AMMONIA in the last 168 hours. Coagulation Profile: Recent Labs  Lab 05/14/21 2347  INR 0.9   Cardiac Enzymes: No results for input(s): CKTOTAL, CKMB, CKMBINDEX, TROPONINI in the last 168 hours. BNP (last 3 results) No results for input(s): PROBNP in the last 8760 hours. HbA1C: No results for input(s): HGBA1C in the last 72 hours. CBG: No results for input(s): GLUCAP in the last 168 hours. Lipid Profile: No results for input(s): CHOL, HDL, LDLCALC, TRIG, CHOLHDL, LDLDIRECT in the last 72 hours. Thyroid Function Tests: No results for input(s): TSH, T4TOTAL, FREET4, T3FREE, THYROIDAB in the last 72 hours. Anemia Panel: No results for input(s): VITAMINB12, FOLATE, FERRITIN, TIBC, IRON, RETICCTPCT in the last 72 hours. Urine analysis:    Component Value Date/Time   COLORURINE YELLOW 05/14/2021 2347   APPEARANCEUR CLEAR 05/14/2021 2347   LABSPEC 1.010 05/14/2021 2347   PHURINE 7.0 05/14/2021 2347   GLUCOSEU NEGATIVE 05/14/2021 2347   HGBUR NEGATIVE 05/14/2021 2347   BILIRUBINUR NEGATIVE 05/14/2021 2347   KETONESUR NEGATIVE  05/14/2021 2347   PROTEINUR NEGATIVE 05/14/2021 2347   NITRITE NEGATIVE 05/14/2021 2347   LEUKOCYTESUR NEGATIVE 05/14/2021 2347   Sepsis Labs: !!!!!!!!!!!!!!!!!!!!!!!!!!!!!!!!!!!!!!!!!!!! @LABRCNTIP (procalcitonin:4,lacticidven:4) ) Recent Results (from the past 240 hour(s))  Resp Panel by RT-PCR (Flu A&B, Covid) Nasopharyngeal Swab     Status: None   Collection Time: 05/15/21  2:06 AM   Specimen: Nasopharyngeal Swab; Nasopharyngeal(NP) swabs in vial transport medium  Result Value Ref Range Status   SARS Coronavirus 2 by RT PCR NEGATIVE NEGATIVE Final    Comment: (NOTE) SARS-CoV-2 target nucleic acids are NOT DETECTED.  The SARS-CoV-2 RNA is generally detectable in upper respiratory specimens during the acute phase of infection. The lowest concentration of SARS-CoV-2 viral copies this assay can detect is 138 copies/mL. A negative result does not preclude SARS-Cov-2 infection and should not be used as the sole basis for treatment or other patient management decisions. A negative result may occur with  improper specimen collection/handling, submission of specimen other than nasopharyngeal swab, presence of viral mutation(s) within the areas targeted by this assay, and inadequate number of viral copies(<138 copies/mL). A negative result must be combined with clinical observations, patient history, and epidemiological information. The expected result is Negative.  Fact Sheet for Patients:  EntrepreneurPulse.com.au  Fact Sheet for Healthcare Providers:  IncredibleEmployment.be  This test is no t yet approved or cleared by the [place] FDA and  has been authorized for detection and/or diagnosis of SARS-CoV-2 by FDA under an Emergency Use Authorization (EUA). This EUA will remain  in effect (meaning this test can be used) for the duration of the COVID-19 declaration under Section 564(b)(1) of the Act, 21 U.S.C.section 360bbb-3(b)(1), unless the  authorization is terminated  or revoked sooner.       Influenza A by PCR NEGATIVE NEGATIVE Final   Influenza B by PCR NEGATIVE NEGATIVE Final    Comment: (NOTE) The Xpert Xpress SARS-CoV-2/FLU/RSV plus assay is intended as an aid in the diagnosis of influenza from Nasopharyngeal swab specimens and should not be used as a sole basis for treatment. Nasal washings and aspirates are unacceptable for Xpert Xpress SARS-CoV-2/FLU/RSV testing.  Fact Sheet for Patients: EntrepreneurPulse.com.au  Fact Sheet for Healthcare Providers: IncredibleEmployment.be  This test is not  yet approved or cleared by the [place] and has been authorized for detection and/or diagnosis of SARS-CoV-2 by FDA under an Emergency Use Authorization (EUA). This EUA will remain in effect (meaning this test can be used) for the duration of the COVID-19 declaration under Section 564(b)(1) of the Act, 21 U.S.C. section 360bbb-3(b)(1), unless the authorization is terminated or revoked.  Performed at [facility], [place] [address], [place], [place] [address]      Radiological Exams on Admission: CT ANGIO HEAD NECK W WO CM  Result Date: 05/15/2021 CLINICAL DATA:  Initial evaluation for neuro deficit, stroke. EXAM: CT ANGIOGRAPHY HEAD AND NECK TECHNIQUE: Multidetector CT imaging of the head and neck was performed using the standard protocol during bolus administration of intravenous contrast. Multiplanar CT image reconstructions and MIPs were obtained to evaluate the vascular anatomy. Carotid stenosis measurements (when applicable) are obtained utilizing NASCET criteria, using the distal internal carotid diameter as the denominator. RADIATION DOSE REDUCTION: This exam was performed according to the departmental dose-optimization program which includes automated exposure control, adjustment of the mA and/or kV according to patient size and/or use of iterative reconstruction  technique. CONTRAST:  59mL OMNIPAQUE IOHEXOL 350 MG/ML SOLN COMPARISON:  Prior study from 01/15/2021. FINDINGS: CT HEAD FINDINGS Brain: Examination degraded by motion artifact. Cerebral volume within normal limits. No acute intracranial hemorrhage. No visible acute large vessel territory infarct. No mass lesion, midline shift or mass effect. No hydrocephalus or extra-axial fluid collection. Vascular: No visible hyperdense vessel. Skull: Scalp soft tissues demonstrate no acute finding. Calvarium grossly intact. Sinuses: Paranasal sinuses and mastoid air cells are grossly clear. Orbits: No obvious abnormality about the globes orbital soft tissues. Review of the MIP images confirms the above findings CTA NECK FINDINGS Aortic arch: A shin examination degraded by motion. Visualized aortic arch normal caliber with normal branch pattern. Mild plaque within the arch itself. No stenosis about the origin the great vessels. Right carotid system: Right common and internal carotid arteries patent without stenosis or dissection. Left carotid system: Left CCA patent from its origin to the bifurcation without stenosis. Eccentric soft plaque at the left carotid bulb/proximal left ICA with associated stenosis of up to approximately 50% by NASCET criteria. Left ICA patent distally without stenosis or dissection. Vertebral arteries: Both vertebral arteries arise from subclavian arteries. No proximal subclavian artery stenosis. Right vertebral artery dominant. Vertebral arteries patent without stenosis or dissection. Skeleton: No discrete or worrisome osseous lesions. Mild chronic height loss noted at the superior endplates of T2 through T4. Other neck: No other acute soft tissue abnormality within the neck. Upper chest: Scattered patchy multifocal opacities seen within the partially visualized left lung, suspicious for possible infection/pneumonia. Underlying centrilobular emphysema. Review of the MIP images confirms the above findings  CTA HEAD FINDINGS Anterior circulation: Both internal carotid arteries patent to the termini without stenosis. A1 segments patent bilaterally. Normal anterior communicating artery complex. Anterior cerebral arteries widely patent bilaterally. No M1 stenosis or occlusion. 4 mm saccular aneurysm seen at the left MCA bifurcation (series 11, image 102). 3 mm saccular aneurysm present at the contralateral right MCA bifurcation (series 11, image 104). Distal MCA branches well perfused and symmetric. Additional Posterior circulation: Both V4 segments patent to the vertebrobasilar junction without stenosis. Both PICA origins patent and normal. Basilar patent to its distal aspect without stenosis. Superior cerebellar and posterior cerebral arteries widely patent bilaterally. Venous sinuses: Patent allowing for timing of contrast bolus. Anatomic variants: None significant. Review of the MIP  images confirms the above findings IMPRESSION: CT HEAD IMPRESSION: 1. Motion degraded exam. 2. No acute intracranial abnormality. CTA HEAD AND NECK IMPRESSION: 1. Negative CTA for large vessel occlusion. 2. 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 3. 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 4. Patchy multifocal opacities within the partially visualized left lung, suspicious for infection/pneumonia. 5. Emphysema (ICD10-J43.9). Electronically Signed   By: Jeannine Boga M.D.   On: 05/15/2021 04:39   DG Chest Port 1 View  Result Date: 05/15/2021 CLINICAL DATA:  Possible sepsis.  Right arm pain. EXAM: PORTABLE CHEST 1 VIEW COMPARISON:  01/15/2021. FINDINGS: The heart size and mediastinal contours are stable. Apical pleural scarring and postsurgical changes are noted in the left upper lobe. There is elevation of the left diaphragm with patchy interstitial and airspace opacities in the mid to lower left lung field. The right lung is clear. No effusion or pneumothorax. No  acute osseous abnormality. IMPRESSION: 1. Interstitial and airspace opacities in the mid to lower left lung field, possible developing pneumonia. 2. Stable postsurgical changes and apical pleural thickening on the left. Electronically Signed   By: Brett Fairy M.D.   On: 05/15/2021 00:22    EKG: Independently reviewed.  Sinus tach 114 no acute changes  Assessment/Plan  51 year old female with recent pneumonia, COPD exacerbation and right upper extremity weakness  Principal Problem:   Acute exacerbation of chronic obstructive pulmonary disease (COPD) with recent pneumonia (HCC)-IV Solu-Medrol.  Frequent nebs.  Change of antibiotics to vancomycin and cefepime.  She has been on Levaquin but I would not classify this as a failed outpatient treatment as patient has been afebrile for over 72 hours White count is slightly elevated likely due to recent institution of prednisone.  Broaden until blood cultures are negative however.  Afebrile at this time.  Treat COPD exacerbation with IV Solu-Medrol frequent nebulizer treatments.  Advised to quit smoking.  Active Problems:    Acute respiratory failure with hypoxia (HCC)-documented initially in the 80s on arrival this appears to have resolved already.  Currently not on any oxygen in the ED.    RUE weakness-proceed with MRI.  No focal deficits on exam right now.    Further recommendations pending on overall hospital course   DVT prophylaxis: SCDs ambulate Code Status: Full Family Communication: None Disposition Plan: 1 to 2 days Consults called: Neuro Admission status: Admit   Cathline Dowen A MD [facility]  If 7PM-7AM, please contact night-coverage [email or web address] Password [facility]  05/15/2021, 7:37 AM

## 2021-05-15 NOTE — Progress Notes (Signed)
Carryover admission to the day admit her; I discussed this patient's case with EDP, Sponseller, Rebekah, PA.  Per these discussions:  This is a 51 year old female with history of COPD who is being admitted for acute COPD exacerbation complicated by acute hypoxic respiratory distress in the setting of pneumonia after presenting with 4 to 5 days of progressive shortness of breath, progressive and spite of compliantly completing outpatient course of Levaquin.  Initial O2 sats noted to be in the mid to high 80s on room air, in the context of no baseline supplemental O2 requirements.  Ensuing improvement into the mid 90s regarding her O2 sats, on 2 L nasal cannula.  Given recent course of Levaquin, she has been started on IV vancomycin and cefepime.  I have placed an inpatient order to admit her to med telemetry for further evaluation management of acute RESPIRATORY distress due to acute COPD exacerbation and pneumonia.  I have also placed a basic admission orders via completion of the adult multi morbid order set.  We will also place orders for scheduled duo nebulizer treatments, prn albuterol nebulizers, solumedrol.  Additionally, the patient reports acute onset of right upper extremity weakness starting on the morning of 05/14/2021, and was completely outside of the window for tPA administration by the time she arrived at the emergency department.  Neurologic exam appears inconsistent.  CT/CTA head/neck unremarkable.  Evaluated by neurology, who recommended an MRI brain, which has been ordered, with result currently pending.   Babs Bertin, DO Hospitalist

## 2021-05-15 NOTE — Progress Notes (Signed)
Pharmacy Antibiotic Note  Courtney Mayer is a 51 y.o. female admitted on 05/14/2021 with pneumonia.  Pharmacy has been consulted for Vancomycin/Cefepime dosing. WBC elevated. Renal function ok. Has been on outpatient Levaquin but having more shortness of breath today .  Plan: Vancomycin 1000 mg IV x 1, then 500 mg IV q12h >>>Estimated AUC: 414 Cefepime 2g IV q8h Trend WBC, temp, renal function  F/U infectious work-up Drug levels as indicated   Height: 5\' 5"  (165.1 cm) Weight: 50.8 kg (112 lb) IBW/kg (Calculated) : 57  Temp (24hrs), Avg:99.9 F (37.7 C), Min:99.9 F (37.7 C), Max:99.9 F (37.7 C)  Recent Labs  Lab 05/14/21 0112 05/14/21 2347  WBC  --  15.5*  CREATININE  --  0.75  LATICACIDVEN 1.0  --     Estimated Creatinine Clearance: 66 mL/min (by C-G formula based on SCr of 0.75 mg/dL).    Allergies  Allergen Reactions   Tramadol Palpitations    Can't take Ultram ER - palpitations  Regular Tramadol is fine to take Can't take Ultram ER - palpitations  Regular Tramadol is fine to take    Amoxicillin Diarrhea   Compazine [Prochlorperazine] Other (See Comments)    Excitability   Compazine [Prochlorperazine] Anxiety    Narda Bonds, PharmD, BCPS Clinical Pharmacist Phone: [phone]

## 2021-05-16 ENCOUNTER — Inpatient Hospital Stay (HOSPITAL_COMMUNITY): Payer: Managed Care, Other (non HMO)

## 2021-05-16 DIAGNOSIS — R29898 Other symptoms and signs involving the musculoskeletal system: Secondary | ICD-10-CM

## 2021-05-16 DIAGNOSIS — I6389 Other cerebral infarction: Secondary | ICD-10-CM

## 2021-05-16 LAB — URINE CULTURE: Culture: NO GROWTH

## 2021-05-16 LAB — BASIC METABOLIC PANEL
Anion gap: 10 (ref 5–15)
BUN: 14 mg/dL (ref 6–20)
CO2: 24 mmol/L (ref 22–32)
Calcium: 8.8 mg/dL — ABNORMAL LOW (ref 8.9–10.3)
Chloride: 104 mmol/L (ref 98–111)
Creatinine, Ser: 0.78 mg/dL (ref 0.44–1.00)
GFR, Estimated: >60 mL/min (ref >60)
Glucose, Bld: 134 mg/dL — ABNORMAL HIGH (ref 70–99)
Potassium: 5.4 mmol/L — ABNORMAL HIGH (ref 3.5–5.1)
Sodium: 138 mmol/L (ref 135–145)

## 2021-05-16 LAB — HEMOGLOBIN A1C
Hgb A1c MFr Bld: 6 % — ABNORMAL HIGH (ref 4.8–5.6)
Mean Plasma Glucose: 125.5 mg/dL

## 2021-05-16 LAB — BLOOD CULTURE ID PANEL (REFLEXED) - BCID2

## 2021-05-16 LAB — LIPID PANEL
Cholesterol: 161 mg/dL (ref 0–200)
HDL: 34 mg/dL — ABNORMAL LOW (ref >40)
LDL Cholesterol: 110 mg/dL — ABNORMAL HIGH (ref 0–99)
Total CHOL/HDL Ratio: 4.7 RATIO
Triglycerides: 87 mg/dL (ref <150)
VLDL: 17 mg/dL (ref 0–40)

## 2021-05-16 LAB — ECHOCARDIOGRAM COMPLETE
AR max vel: 3.02 cm2
AV Area VTI: 2.96 cm2
AV Area mean vel: 2.86 cm2
AV Mean grad: 5 mmHg
AV Peak grad: 8 mmHg
Ao pk vel: 1.41 m/s
Area-P 1/2: 3.68 cm2
Height: 65 in
S' Lateral: 2.25 cm
Weight: 1689.61 oz

## 2021-05-16 LAB — MRSA NEXT GEN BY PCR, NASAL: MRSA by PCR Next Gen: NOT DETECTED

## 2021-05-16 LAB — PROCALCITONIN: Procalcitonin: <0.1 ng/mL

## 2021-05-16 LAB — VANCOMYCIN, RANDOM: Vancomycin Rm: 16

## 2021-05-16 LAB — HIV ANTIBODY (ROUTINE TESTING W REFLEX): HIV Screen 4th Generation wRfx: NONREACTIVE

## 2021-05-16 LAB — MAGNESIUM: Magnesium: 1.9 mg/dL (ref 1.7–2.4)

## 2021-05-16 MED ORDER — LURASIDONE HCL 60 MG PO TABS
60.0000 mg | ORAL_TABLET | Freq: Every day | ORAL | Status: DC
  Filled 2021-05-16: qty 1

## 2021-05-16 MED ORDER — LURASIDONE HCL 40 MG PO TABS
60.0000 mg | ORAL_TABLET | Freq: Every day | ORAL | Status: DC
  Filled 2021-05-16: qty 1

## 2021-05-16 MED ORDER — TIOTROPIUM BROMIDE MONOHYDRATE 18 MCG IN CAPS
18.0000 ug | ORAL_CAPSULE | Freq: Every day | RESPIRATORY_TRACT | Status: DC
Start: 2021-05-16 — End: 2021-05-16

## 2021-05-16 MED ORDER — ALBUTEROL SULFATE (2.5 MG/3ML) 0.083% IN NEBU: 2.5000 mg | INHALATION_SOLUTION | Freq: Four times a day (QID) | RESPIRATORY_TRACT | Status: DC | PRN

## 2021-05-16 MED ORDER — METHYLPREDNISOLONE SODIUM SUCC 40 MG IJ SOLR: 40.0000 mg | Freq: Two times a day (BID) | INTRAMUSCULAR | Status: DC

## 2021-05-16 MED ORDER — HEPARIN SODIUM (PORCINE) 5000 UNIT/ML IJ SOLN
5000.0000 [IU] | Freq: Three times a day (TID) | INTRAMUSCULAR | Status: DC
  Administered 2021-05-16 – 2021-05-17 (×4): 5000 [IU] via SUBCUTANEOUS
  Filled 2021-05-16 (×4): qty 1

## 2021-05-16 MED ORDER — SODIUM POLYSTYRENE SULFONATE 15 GM/60ML PO SUSP
30.0000 g | Freq: Once | ORAL | Status: AC
  Administered 2021-05-16: 30 g via ORAL
  Filled 2021-05-16: qty 120

## 2021-05-16 MED ORDER — ROSUVASTATIN CALCIUM 20 MG PO TABS
20.0000 mg | ORAL_TABLET | Freq: Every day | ORAL | Status: DC
  Administered 2021-05-16 – 2021-05-17 (×2): 20 mg via ORAL
  Filled 2021-05-16 (×2): qty 1

## 2021-05-16 MED ORDER — LURASIDONE HCL 40 MG PO TABS
60.0000 mg | ORAL_TABLET | Freq: Every day | ORAL | Status: DC
  Administered 2021-05-17: 60 mg via ORAL
  Filled 2021-05-16 (×2): qty 1

## 2021-05-16 MED ORDER — IPRATROPIUM-ALBUTEROL 0.5-2.5 (3) MG/3ML IN SOLN
3.0000 mL | Freq: Three times a day (TID) | RESPIRATORY_TRACT | Status: DC
  Administered 2021-05-16: 3 mL via RESPIRATORY_TRACT
  Filled 2021-05-16: qty 3

## 2021-05-16 MED ORDER — AMITRIPTYLINE HCL 25 MG PO TABS
50.0000 mg | ORAL_TABLET | Freq: Every day | ORAL | Status: DC
  Administered 2021-05-17: 50 mg via ORAL
  Filled 2021-05-16: qty 2

## 2021-05-16 MED ORDER — ASPIRIN 81 MG PO CHEW
81.0000 mg | CHEWABLE_TABLET | Freq: Every day | ORAL | Status: DC
  Administered 2021-05-16 – 2021-05-17 (×2): 81 mg via ORAL
  Filled 2021-05-16 (×2): qty 1

## 2021-05-16 MED ORDER — QUETIAPINE FUMARATE 50 MG PO TABS
50.0000 mg | ORAL_TABLET | Freq: Every day | ORAL | Status: DC
  Administered 2021-05-17: 50 mg via ORAL
  Filled 2021-05-16: qty 1

## 2021-05-16 MED ORDER — GUAIFENESIN ER 600 MG PO TB12
600.0000 mg | ORAL_TABLET | Freq: Two times a day (BID) | ORAL | Status: DC | PRN
  Administered 2021-05-16: 600 mg via ORAL
  Filled 2021-05-16: qty 1

## 2021-05-16 MED ORDER — OXYCODONE-ACETAMINOPHEN 5-325 MG PO TABS
1.0000 | ORAL_TABLET | Freq: Four times a day (QID) | ORAL | Status: DC | PRN
  Administered 2021-05-16 (×2): 1 via ORAL
  Filled 2021-05-16 (×2): qty 1

## 2021-05-16 MED ORDER — FLUTICASONE-UMECLIDIN-VILANT 100-62.5-25 MCG/ACT IN AEPB: 1.0000 | INHALATION_SPRAY | Freq: Every day | RESPIRATORY_TRACT | Status: DC

## 2021-05-16 MED ORDER — OXYCODONE-ACETAMINOPHEN 5-325 MG PO TABS
2.0000 | ORAL_TABLET | Freq: Four times a day (QID) | ORAL | Status: DC | PRN
  Administered 2021-05-17 (×2): 2 via ORAL
  Filled 2021-05-16 (×2): qty 2

## 2021-05-16 MED ORDER — PANTOPRAZOLE SODIUM 40 MG PO TBEC
40.0000 mg | DELAYED_RELEASE_TABLET | Freq: Every day | ORAL | Status: DC
  Administered 2021-05-16 – 2021-05-17 (×2): 40 mg via ORAL
  Filled 2021-05-16 (×2): qty 1

## 2021-05-16 MED ORDER — FLUTICASONE FUROATE-VILANTEROL 100-25 MCG/ACT IN AEPB
1.0000 | INHALATION_SPRAY | Freq: Every day | RESPIRATORY_TRACT | Status: DC
  Administered 2021-05-16 – 2021-05-17 (×2): 1 via RESPIRATORY_TRACT
  Filled 2021-05-16: qty 28

## 2021-05-16 MED ORDER — METHYLPREDNISOLONE SODIUM SUCC 40 MG IJ SOLR
40.0000 mg | INTRAMUSCULAR | Status: DC
  Administered 2021-05-16 – 2021-05-17 (×2): 40 mg via INTRAVENOUS
  Filled 2021-05-16 (×2): qty 1

## 2021-05-16 MED ORDER — UMECLIDINIUM BROMIDE 62.5 MCG/ACT IN AEPB
1.0000 | INHALATION_SPRAY | Freq: Every day | RESPIRATORY_TRACT | Status: DC
  Administered 2021-05-16 – 2021-05-17 (×2): 1 via RESPIRATORY_TRACT
  Filled 2021-05-16: qty 7

## 2021-05-16 NOTE — Progress Notes (Signed)
SLP Screen  Patient Details Name: Asuzena Weis MRN: [id number] DOB: October 20, 1970   Cancelled treatment:       Reason Eval/Treat Not Completed: Other (comment) (SLP screened for speech-language cog). SLP returned in PM to screen patient to determine if need to seek speech-language cognitive evaluation as patient has informed SLP (during bedside swallow evaluation) that she was having some difficulty thinking of what letters to type when texting using her right hand (dominant) but that she did not have this difficulty when typing with her left hand.) Patient used her phone to demonstrate this and while typing she commented that "it is much better". SLP did not observe any significant language or cognitive impairments as per this screen, however recommended that patient seek out OP SLP evaluation if she is noticing any difficulties with complex level cognitive function.   Sonia Baller, MA, CCC-SLP Speech Therapy

## 2021-05-16 NOTE — Progress Notes (Signed)
PHARMACY - PHYSICIAN COMMUNICATION CRITICAL VALUE ALERT - BLOOD CULTURE IDENTIFICATION (BCID)  Kathaleen Dudziak is an 51 y.o. female who presented to [facility] on 05/14/2021 with a chief complaint of SOB, weakness, with recent history of pneumonia.   Assessment:  1 out of 4 bottles with GPC (in aerobic bottle).  BCID with no targets detected.   Name of physician (or Provider) Contacted: Dr. Lala Lund  Current antibiotics: Cefepime, Vancomycin  Changes to prescribed antibiotics recommended:  Patient is on recommended antibiotics - No changes needed  Results for orders placed or performed during the hospital encounter of 05/14/21  Blood Culture ID Panel (Reflexed) (Collected: 05/15/2021 12:55 AM)  Result Value Ref Range   Enterococcus faecalis NOT DETECTED NOT DETECTED   Enterococcus Faecium NOT DETECTED NOT DETECTED   Listeria monocytogenes NOT DETECTED NOT DETECTED   Staphylococcus species NOT DETECTED NOT DETECTED   Staphylococcus aureus (BCID) NOT DETECTED NOT DETECTED   Staphylococcus epidermidis NOT DETECTED NOT DETECTED   Staphylococcus lugdunensis NOT DETECTED NOT DETECTED   Streptococcus species NOT DETECTED NOT DETECTED   Streptococcus agalactiae NOT DETECTED NOT DETECTED   Streptococcus pneumoniae NOT DETECTED NOT DETECTED   Streptococcus pyogenes NOT DETECTED NOT DETECTED   A.calcoaceticus-baumannii NOT DETECTED NOT DETECTED   Bacteroides fragilis NOT DETECTED NOT DETECTED   Enterobacterales NOT DETECTED NOT DETECTED   Enterobacter cloacae complex NOT DETECTED NOT DETECTED   Escherichia coli NOT DETECTED NOT DETECTED   Klebsiella aerogenes NOT DETECTED NOT DETECTED   Klebsiella oxytoca NOT DETECTED NOT DETECTED   Klebsiella pneumoniae NOT DETECTED NOT DETECTED   Proteus species NOT DETECTED NOT DETECTED   Salmonella species NOT DETECTED NOT DETECTED   Serratia marcescens NOT DETECTED NOT DETECTED   Haemophilus influenzae NOT DETECTED NOT DETECTED   Neisseria  meningitidis NOT DETECTED NOT DETECTED   Pseudomonas aeruginosa NOT DETECTED NOT DETECTED   Stenotrophomonas maltophilia NOT DETECTED NOT DETECTED   Candida albicans NOT DETECTED NOT DETECTED   Candida auris NOT DETECTED NOT DETECTED   Candida glabrata NOT DETECTED NOT DETECTED   Candida krusei NOT DETECTED NOT DETECTED   Candida parapsilosis NOT DETECTED NOT DETECTED   Candida tropicalis NOT DETECTED NOT DETECTED   Cryptococcus neoformans/gattii NOT DETECTED NOT DETECTED    Vance Peper, PharmD PGY1 Pharmacy Resident 05/16/2021 1:27 PM   Please check AMION for all [place] phone numbers After 10:00 PM, call [place] [phone]

## 2021-05-16 NOTE — Progress Notes (Signed)
PROGRESS NOTE                                                                                                                                                                                                             Patient Demographics:    Courtney Mayer, is a 51 y.o. female, DOB - [birth year], JOA:[id number]  Outpatient Primary MD for the patient is Gildardo Cranker, MD    LOS - 1  Admit date - 05/14/2021    Chief Complaint  Patient presents with   Shortness of Breath   Weakness       Brief Narrative (HPI from H&P)   - Courtney Mayer is a 51 y.o. female with medical history significant of COPD, tobacco abuse, chronic pain comes to the emergency department because of shortness of breath and right upper extremity weakness.  She is also recovering from recent diagnosis of pneumonia.  She was admitted to the hospital for right arm weakness work-up and unresolved pneumonialike symptoms   Subjective:    Courtney Mayer today has, No headache, No chest pain, No abdominal pain - No Nausea, mild cough improved shortness of breath, some tingling and comparative weakness in the right arm.   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  Echo, PT-OT and speech eval ordered.  A1c greater than 100 hence added Crestor, most likely will require DAPT.  Also noted 50% lesion in left ICA question if she requires vascular surgery input as well will defer this to neurology.  Strictly counseled to quit smoking.  2.  Recent history of pneumonia.  Clinically stable chest x-ray not very impressive, no productive cough or fever, no shortness of  breath currently on room air, for now empiric antibiotics for the 24 hours if cultures are negative will rapidly taper off antibiotics.  3.  Recent diagnosis of COPD exacerbation.  No wheezing, no oxygen demand, supportive care, counseled to quit smoking, rapidly taper off steroids.  4.  Ongoing smoking.  Counseled to quit, NicoDerm patch.  5.  Chronic back pain with  narcotic dependence.  Home narcotics.  6.  Chronic anxiety and depression.  Home psych medications.  7.  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  Kindly see #1 above  8.  Dyslipidemia.  Placed on statin.      Condition -  Guarded  Family Communication  :  None present  Code Status :  Full  Consults  :  Neuro  PUD Prophylaxis : PPI   Procedures  :     MRI - Scattered small acute infarcts in the left frontal and parietal lobes involving the corona radiata, parietal lobe cortex, and pre and postcentral gyri in the MCA distribution.   CTA -   CT HEAD IMPRESSION: 1. Motion degraded exam. 2. No acute intracranial abnormality.   CTA HEAD AND NECK IMPRESSION: 1. Negative CTA for large vessel occlusion. 2. 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 3. 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 4. Patchy multifocal opacities within the partially visualized left lung, suspicious for infection/pneumonia. 5. Emphysema   TTE      Disposition Plan  :    Status is: Inpatient  Remains inpatient appropriate because: CVA, CAP    DVT Prophylaxis  :  Heparin  SCDs Start: 05/15/21 0503    Lab Results  Component Value Date   PLT 458 (H) 05/14/2021    Diet :  Diet Order             Diet regular Room service appropriate? Yes; Fluid consistency: Thin  Diet effective now                    Inpatient Medications  Scheduled Meds:  amitriptyline  50 mg Oral QHS   aspirin  81 mg Oral Daily   fluticasone furoate-vilanterol  1 puff Inhalation Daily   And    umeclidinium bromide  1 puff Inhalation Daily   gabapentin  900 mg Oral TID   lurasidone  60 mg Oral Q breakfast   methylPREDNISolone (SOLU-MEDROL) injection  40 mg Intravenous Q12H   montelukast  10 mg Oral QHS   nicotine  21 mg Transdermal Daily   oxyCODONE  30 mg Oral BID   pantoprazole  40 mg Oral Daily   QUEtiapine  50 mg Oral QHS   rosuvastatin  20 mg Oral Daily   sodium polystyrene  30 g Oral Once   Continuous Infusions:  ceFEPime (MAXIPIME) IV 2 g (05/16/21 0549)   vancomycin 500 mg (05/15/21 2321)   PRN Meds:.acetaminophen **OR** acetaminophen, albuterol, ALPRAZolam, metoCLOPramide, oxyCODONE-acetaminophen **AND** [DISCONTINUED] oxyCODONE  Antibiotics  :    Anti-infectives (From admission, onward)    Start     Dose/Rate Route Frequency Ordered Stop   05/15/21 1000  ceFEPIme (MAXIPIME) 2 g in sodium chloride 0.9 % 100 mL IVPB        2 g 200 mL/hr over 30 Minutes Intravenous Every 8 hours 05/15/21 0226     05/15/21 1000  vancomycin (VANCOREADY) IVPB 500 mg/100 mL        500 mg 100 mL/hr over 60 Minutes Intravenous Every 12 hours 05/15/21 0226     05/15/21 0000  vancomycin (VANCOCIN) IVPB 1000 mg/200 mL premix        1,000 mg 200 mL/hr over 60 Minutes Intravenous  Once 05/14/21 2348 05/15/21 0350   05/15/21 0000  ceFEPIme (MAXIPIME) 2 g in sodium chloride 0.9 % 100 mL IVPB  2 g 200 mL/hr over 30 Minutes Intravenous  Once 05/14/21 2348 05/15/21 0504        Time Spent in minutes  30   Lala Lund M.D on 05/16/2021 at 9:17 AM  To page go to [email or web address]   [facility] -  Office  [phone]  See all Orders from today for further details    Objective:   Vitals:   05/16/21 0047 05/16/21 0501 05/16/21 0647 05/16/21 0755  BP: 111/77 (!) 115/91  121/89  Pulse: 95 88 97 100  Resp: 18 17  16   Temp: 97.8 F (36.6 C) 98 F (36.7 C)  98.5 F (36.9 C)  TempSrc: Oral Oral  Oral  SpO2: 90% 92% (!) 89% 91%  Weight:   47.9 kg   Height:        Wt  Readings from Last 3 Encounters:  05/16/21 47.9 kg  01/15/21 51.7 kg     Intake/Output Summary (Last 24 hours) at 05/16/2021 0917 Last data filed at 05/15/2021 2030 Gross per 24 hour  Intake 987.91 ml  Output --  Net 987.91 ml     Physical Exam  Awake Alert, No new F.N deficits, Normal affect [place].AT,PERRAL Supple Neck, No JVD,   Symmetrical Chest wall movement, Good air movement bilaterally, CTAB RRR,No Gallops,Rubs or new Murmurs,  +ve B.Sounds, Abd Soft, No tenderness,   No Cyanosis, Clubbing or edema       Data Review:    CBC Recent Labs  Lab 05/14/21 2347  WBC 15.5*  HGB 14.0  HCT 41.5  PLT 458*  MCV 97.0  MCH 32.7  MCHC 33.7  RDW 14.6  LYMPHSABS 3.5  MONOABS 1.1*  EOSABS 0.1  BASOSABS 0.1    Electrolytes Recent Labs  Lab 05/14/21 0112 05/14/21 2347 05/15/21 0944 05/15/21 1843 05/16/21 0612  NA  --  137 140  --  138  K  --  5.8* 4.2  --  5.4*  CL  --  102 105  --  104  CO2  --  22 27  --  24  GLUCOSE  --  108* 104*  --  134*  BUN  --  12 8  --  14  CREATININE  --  0.75 0.67  --  0.78  CALCIUM  --  9.2 8.7*  --  8.8*  AST  --  45*  --   --   --   ALT  --  51*  --   --   --   ALKPHOS  --  153*  --   --   --   BILITOT  --  1.7*  --   --   --   ALBUMIN  --  2.6*  --   --   --   MG  --   --   --   --  1.9  LATICACIDVEN 1.0  --   --  1.9  --   INR  --  0.9  --   --   --   HGBA1C  --   --   --   --  6.0*  BNP  --  24.6  --   --   --     ------------------------------------------------------------------------------------------------------------------ Recent Labs    05/16/21 0612  CHOL 161  HDL 34*  LDLCALC 110*  TRIG 87  CHOLHDL 4.7    Lab Results  Component Value Date   HGBA1C 6.0 (H) 05/16/2021    No results for input(s): TSH, T4TOTAL, T3FREE, THYROIDAB in  the last 72 hours.  Invalid input(s): FREET3 ------------------------------------------------------------------------------------------------------------------ ID  Labs Recent Labs  Lab 05/14/21 0112 05/14/21 2347 05/15/21 0944 05/15/21 1843 05/16/21 0612  WBC  --  15.5*  --   --   --   PLT  --  458*  --   --   --   LATICACIDVEN 1.0  --   --  1.9  --   CREATININE  --  0.75 0.67  --  0.78   Cardiac Enzymes No results for input(s): CKMB, TROPONINI, MYOGLOBIN in the last 168 hours.  Invalid input(s): CK     Radiology Reports CT ANGIO HEAD NECK W WO CM  Result Date: 05/15/2021 CLINICAL DATA:  Initial evaluation for neuro deficit, stroke. EXAM: CT ANGIOGRAPHY HEAD AND NECK TECHNIQUE: Multidetector CT imaging of the head and neck was performed using the standard protocol during bolus administration of intravenous contrast. Multiplanar CT image reconstructions and MIPs were obtained to evaluate the vascular anatomy. Carotid stenosis measurements (when applicable) are obtained utilizing NASCET criteria, using the distal internal carotid diameter as the denominator. RADIATION DOSE REDUCTION: This exam was performed according to the departmental dose-optimization program which includes automated exposure control, adjustment of the mA and/or kV according to patient size and/or use of iterative reconstruction technique. CONTRAST:  70mL OMNIPAQUE IOHEXOL 350 MG/ML SOLN COMPARISON:  Prior study from 01/15/2021. FINDINGS: CT HEAD FINDINGS Brain: Examination degraded by motion artifact. Cerebral volume within normal limits. No acute intracranial hemorrhage. No visible acute large vessel territory infarct. No mass lesion, midline shift or mass effect. No hydrocephalus or extra-axial fluid collection. Vascular: No visible hyperdense vessel. Skull: Scalp soft tissues demonstrate no acute finding. Calvarium grossly intact. Sinuses: Paranasal sinuses and mastoid air cells are grossly clear. Orbits: No obvious abnormality about the globes orbital soft tissues. Review of the MIP images confirms the above findings CTA NECK FINDINGS Aortic arch: A shin examination degraded by  motion. Visualized aortic arch normal caliber with normal branch pattern. Mild plaque within the arch itself. No stenosis about the origin the great vessels. Right carotid system: Right common and internal carotid arteries patent without stenosis or dissection. Left carotid system: Left CCA patent from its origin to the bifurcation without stenosis. Eccentric soft plaque at the left carotid bulb/proximal left ICA with associated stenosis of up to approximately 50% by NASCET criteria. Left ICA patent distally without stenosis or dissection. Vertebral arteries: Both vertebral arteries arise from subclavian arteries. No proximal subclavian artery stenosis. Right vertebral artery dominant. Vertebral arteries patent without stenosis or dissection. Skeleton: No discrete or worrisome osseous lesions. Mild chronic height loss noted at the superior endplates of T2 through T4. Other neck: No other acute soft tissue abnormality within the neck. Upper chest: Scattered patchy multifocal opacities seen within the partially visualized left lung, suspicious for possible infection/pneumonia. Underlying centrilobular emphysema. Review of the MIP images confirms the above findings CTA HEAD FINDINGS Anterior circulation: Both internal carotid arteries patent to the termini without stenosis. A1 segments patent bilaterally. Normal anterior communicating artery complex. Anterior cerebral arteries widely patent bilaterally. No M1 stenosis or occlusion. 4 mm saccular aneurysm seen at the left MCA bifurcation (series 11, image 102). 3 mm saccular aneurysm present at the contralateral right MCA bifurcation (series 11, image 104). Distal MCA branches well perfused and symmetric. Additional Posterior circulation: Both V4 segments patent to the vertebrobasilar junction without stenosis. Both PICA origins patent and normal. Basilar patent to its distal aspect without stenosis. Superior cerebellar and posterior cerebral arteries widely  patent  bilaterally. Venous sinuses: Patent allowing for timing of contrast bolus. Anatomic variants: None significant. Review of the MIP images confirms the above findings IMPRESSION: CT HEAD IMPRESSION: 1. Motion degraded exam. 2. No acute intracranial abnormality. CTA HEAD AND NECK IMPRESSION: 1. Negative CTA for large vessel occlusion. 2. 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 3. 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 4. Patchy multifocal opacities within the partially visualized left lung, suspicious for infection/pneumonia. 5. Emphysema (ICD10-J43.9). Electronically Signed   By: Jeannine Boga M.D.   On: 05/15/2021 04:39   MR Brain W and Wo Contrast  Result Date: 05/15/2021 CLINICAL DATA:  Right arm weakness since 10/30 last night EXAM: MRI HEAD WITHOUT AND WITH CONTRAST TECHNIQUE: Multiplanar, multiecho pulse sequences of the brain and surrounding structures were obtained without and with intravenous contrast. CONTRAST:  79mL GADAVIST GADOBUTROL 1 MMOL/ML IV SOLN COMPARISON:  Same-day CT/CTA head and neck FINDINGS: Brain: There are multiple small infarcts in the left frontal and parietal lobes involving the corona radiata, parietal lobe cortex, and pre and postcentral gyri. There is no associated hemorrhage or mass effect. There is no acute intracranial hemorrhage or extra-axial fluid collection. Background parenchymal volume is normal. The ventricles are normal in size. Parenchymal signal is otherwise normal. There is no mass lesion or abnormal enhancement. There is no midline shift. Vascular: Normal flow voids. Skull and upper cervical spine: Normal marrow signal. Sinuses/Orbits: The paranasal sinuses are clear. The globes and orbits are unremarkable. Other: None. IMPRESSION: Scattered small acute infarcts in the left frontal and parietal lobes involving the corona radiata, parietal lobe cortex, and pre and postcentral gyri in the MCA  distribution. Electronically Signed   By: Valetta Mole M.D.   On: 05/15/2021 08:12   DG Chest Port 1 View  Result Date: 05/15/2021 CLINICAL DATA:  Possible sepsis.  Right arm pain. EXAM: PORTABLE CHEST 1 VIEW COMPARISON:  01/15/2021. FINDINGS: The heart size and mediastinal contours are stable. Apical pleural scarring and postsurgical changes are noted in the left upper lobe. There is elevation of the left diaphragm with patchy interstitial and airspace opacities in the mid to lower left lung field. The right lung is clear. No effusion or pneumothorax. No acute osseous abnormality. IMPRESSION: 1. Interstitial and airspace opacities in the mid to lower left lung field, possible developing pneumonia. 2. Stable postsurgical changes and apical pleural thickening on the left. Electronically Signed   By: Brett Fairy M.D.   On: 05/15/2021 00:22

## 2021-05-16 NOTE — Evaluation (Signed)
Occupational Therapy Evaluation Patient Details Name: Courtney Mayer MRN: [id number] DOB: 10/17/1970 Today's Date: 05/16/2021   History of Present Illness 51 y.o. female comes to the emergency department 05/15/21 because of shortness of breath and right upper extremity weakness. Acute exacerbation COPD; MRI brain Scattered small acute infarcts in the left frontal and parietal lobes;  PMH significant of COPD, tobacco abuse, chronic pain   Clinical Impression   OT eval initiated and completed. Pt is at Ind baseline level of function with ADLs and mobility, however presents with impaired [facility] and sensation in R hand.  Pt reports difficulty with opening containers, gripping and grasping with R hand. Pt instructed on [facility] exercises/activities for R hand with handout provided. Pt abe to verbalize understanding and return demo with supervision. Pt would benefit from acute OT services before d/c home to address coordination for ADLs and functional tasks     Recommendations for follow up therapy are one component of a multi-disciplinary discharge planning process, led by the attending physician.  Recommendations may be updated based on patient status, additional functional criteria and insurance authorization.   Follow Up Recommendations  No OT follow up    Assistance Recommended at Discharge None  Patient can return home with the following      Functional Status Assessment  Patient has not had a recent decline in their functional status  Equipment Recommendations  None recommended by OT    Recommendations for Other Services       Precautions / Restrictions Precautions Precautions: None Restrictions Weight Bearing Restrictions: No      Mobility Bed Mobility Overal bed mobility: Independent                  Transfers Overall transfer level: Independent                        Balance Overall balance assessment: No apparent balance deficits (not formally assessed)                                          ADL either performed or assessed with clinical judgement   ADL Overall ADL's : Independent;At baseline                                       General ADL Comments: difficulty with opening containers, ties on clothing     Vision Baseline Vision/History: 0 No visual deficits Ability to See in Adequate Light: 0 Adequate Patient Visual Report: No change from baseline       Perception     Praxis      Pertinent Vitals/Pain Pain Assessment Pain Assessment: No/denies pain     Hand Dominance Right   Extremity/Trunk Assessment Upper Extremity Assessment Upper Extremity Assessment: RUE deficits/detail RUE Deficits / Details: pt reports that sensation in R hand is impaired and that she has difficulty with opening containers RUE Sensation: decreased light touch RUE Coordination: decreased fine motor   Lower Extremity Assessment Lower Extremity Assessment: Defer to PT evaluation RLE Deficits / Details: toe extension 1st toe 4/5, ankle DF 5/5 RLE Sensation: decreased light touch (describes mild tingling) RLE Coordination: WNL   Cervical / Trunk Assessment Cervical / Trunk Assessment: Normal   Communication Communication Communication: Expressive difficulties (difficulty with word finding/letters when texting on  her phone)   Cognition Arousal/Alertness: Awake/alert Behavior During Therapy: Anxious Overall Cognitive Status: Within Functional Limits for tasks assessed                                       General Comments       Exercises Other Exercises Other Exercises: Pt instructed on [facility] exercises/activities for R hand with handout provided. Pt abe to verbalize understandnig and return demo with supervision   Shoulder Instructions      Home Living Family/patient expects to be discharged to:: Private residence Living Arrangements: Parent Available Help at Discharge:  Family;Friend(s);Available PRN/intermittently Type of Home: House Home Access: Stairs to enter [employer] of Steps: 5 Entrance Stairs-Rails: Right Home Layout: One level     Bathroom Shower/Tub: Tub/shower unit;Walk-in shower   Bathroom Toilet: Standard     Home Equipment: None          Prior Functioning/Environment Prior Level of Function : Independent/Modified Independent;Working/employed               ADLs Comments: pt employed as a Marine scientist at [employer]        OT Problem List: Decreased coordination;Decreased strength      OT Treatment/Interventions: Therapeutic exercise;Patient/family education;Therapeutic activities    OT Goals(Current goals can be found in the care plan section) Acute Rehab OT Goals Patient Stated Goal: go home OT Goal Formulation: With patient Time For Goal Achievement: 05/30/21 Potential to Achieve Goals: Good ADL Goals Additional ADL Goal #1: Pt will return demonstrate all [place] exercises from handout provided by OT and demo decreased difficulty with opening containers and with gripping/grasping with R hand  OT Frequency: Min 2X/week    Co-evaluation              AM-PAC OT "6 Clicks" Daily Activity     Outcome Measure Help from another person eating meals?: None Help from another person taking care of personal grooming?: None Help from another person toileting, which includes using toliet, bedpan, or urinal?: None Help from another person bathing (including washing, rinsing, drying)?: None Help from another person to put on and taking off regular upper body clothing?: None Help from another person to put on and taking off regular lower body clothing?: None 6 Click Score: 24   End of Session    Activity Tolerance: Patient tolerated treatment well Patient left: in chair  OT Visit Diagnosis: Other abnormalities of gait and mobility (R26.89)                Time: 1032-1101 OT Time Calculation (min): 29  min Charges:  OT General Charges $OT Visit: 1 Visit OT Evaluation $OT Eval Low Complexity: 1 Low OT Treatments $Therapeutic Activity: 8-22 mins    Britt Bottom 05/16/2021, 1:27 PM

## 2021-05-16 NOTE — Progress Notes (Signed)
°  Transition of Care [facility]) Screening Note   Patient Details  Name: Tirsa Gail Date of Birth: Jan 26, 1971   Transition of [facility]) CM/SW Contact:    Cyndi Bender, RN Phone Number: 05/16/2021, 8:25 AM    Transition of Care Department [facility]) has reviewed patient and no TOC needs have been identified at this time. We will continue to monitor patient advancement through interdisciplinary progression rounds. If new patient transition needs arise, please place a TOC consult.

## 2021-05-16 NOTE — Plan of Care (Signed)

## 2021-05-16 NOTE — Evaluation (Signed)
Physical Therapy Evaluation and Discharge Patient Details Name: Courtney Mayer MRN: [id number] DOB: 1970/06/15 Today's Date: 05/16/2021  History of Present Illness  51 y.o. female comes to the emergency department 05/15/21 because of shortness of breath and right upper extremity weakness. Acute exacerbation COPD; MRI brain Scattered small acute infarcts in the left frontal and parietal lobes;  PMH significant of COPD, tobacco abuse, chronic pain  Clinical Impression   Patient evaluated by Physical Therapy with no further acute PT needs identified. All education has been completed and the patient has no further questions. Pt scored 24/24 on Dynamic Gait Index. PT is signing off. Thank you for this referral.        Recommendations for follow up therapy are one component of a multi-disciplinary discharge planning process, led by the attending physician.  Recommendations may be updated based on patient status, additional functional criteria and insurance authorization.  Follow Up Recommendations No PT follow up    Assistance Recommended at Discharge None  Patient can return home with the following       Equipment Recommendations None recommended by PT  Recommendations for Other Services  OT consult    Functional Status Assessment Patient has not had a recent decline in their functional status     Precautions / Restrictions Precautions Precautions: None      Mobility  Bed Mobility Overal bed mobility: Independent                  Transfers Overall transfer level: Independent                      Ambulation/Gait Ambulation/Gait assistance: Independent Gait Distance (Feet): 200 Feet Assistive device: None Gait Pattern/deviations: WFL(Within Functional Limits) Gait velocity: WNL Gait velocity interpretation: >4.37 ft/sec, indicative of normal walking speed   General Gait Details: up in room on her own on arrival  Stairs            Wheelchair  Mobility    Modified Rankin (Stroke Patients Only) Modified Rankin (Stroke Patients Only) Pre-Morbid Rankin Score: No symptoms Modified Rankin: No significant disability     Balance                                 Standardized Balance Assessment Standardized Balance Assessment : Dynamic Gait Index   Dynamic Gait Index Level Surface: Normal Change in Gait Speed: Normal Gait with Horizontal Head Turns: Normal Gait with Vertical Head Turns: Normal Gait and Pivot Turn: Normal Step Over Obstacle: Normal Step Around Obstacles: Normal Steps: Normal Total Score: 24       Pertinent Vitals/Pain      Home Living Family/patient expects to be discharged to:: Private residence Living Arrangements: Parent (mother, 63 yo and independent) Available Help at Discharge: Family;Friend(s);Available PRN/intermittently Type of Home: House Home Access: Stairs to enter Entrance Stairs-Rails: Right Entrance Stairs-Number of Steps: 5     Home Equipment: None      Prior Function Prior Level of Function : Independent/Modified Independent;Working/employed Investment banker, 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)                     Hand Dominance   Dominant Hand: Right    Extremity/Trunk Assessment   Upper Extremity Assessment Upper Extremity Assessment: Defer to OT evaluation (mentions noticed decr fine motor when using her phone)    Lower Extremity Assessment Lower Extremity Assessment: RLE deficits/detail RLE Deficits / Details: toe  extension 1st toe 4/5, ankle DF 5/5 RLE Sensation: decreased light touch (describes mild tingling) RLE Coordination: WNL    Cervical / Trunk Assessment Cervical / Trunk Assessment: Normal  Communication   Communication: Expressive difficulties (reports difficulty with finding words/letters she needs when texting; SLP aware)  Cognition Arousal/Alertness: Awake/alert Behavior During Therapy: Anxious (tearful) Overall Cognitive Status: Within Functional Limits  for tasks assessed                                          General Comments      Exercises     Assessment/Plan    PT Assessment Patient does not need any further PT services  PT Problem List         PT Treatment Interventions      PT Goals (Current goals can be found in the Care Plan section)  Acute Rehab PT Goals Patient Stated Goal: be able to use her rt hand normally PT Goal Formulation: All assessment and education complete, DC therapy    Frequency       Co-evaluation               AM-PAC PT "6 Clicks" Mobility  Outcome Measure Help needed turning from your back to your side while in a flat bed without using bedrails?: None Help needed moving from lying on your back to sitting on the side of a flat bed without using bedrails?: None Help needed moving to and from a bed to a chair (including a wheelchair)?: None Help needed standing up from a chair using your arms (e.g., wheelchair or bedside chair)?: None Help needed to walk in hospital room?: None Help needed climbing 3-5 steps with a railing? : None 6 Click Score: 24    End of Session   Activity Tolerance: Patient tolerated treatment well Patient left: in chair;with call bell/phone within reach;with nursing/sitter in room Nurse Communication: Mobility status PT Visit Diagnosis: Difficulty in walking, not elsewhere classified (R26.2)    Time: 8850-2774 PT Time Calculation (min) (ACUTE ONLY): 15 min   Charges:   PT Evaluation $PT Eval Low Complexity: [place], PT Acute Rehabilitation Services  Pager [phone] Office [phone]   Rexanne Mano 05/16/2021, 10:34 AM

## 2021-05-16 NOTE — Progress Notes (Signed)
Echocardiogram 2D Echocardiogram has been performed.  Arlyss Gandy 05/16/2021, 11:49 AM

## 2021-05-16 NOTE — Consult Note (Signed)
Neurology Consultation  Reason for Consult: MRI with small acute infarctions  Referring Physician: Dr. Candiss Norse  CC: Right arm feeling "cold", decreased right hand dexterity  History is obtained from: Patient, Chart review  HPI: Courtney Mayer is a 51 y.o. 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  Patient states that on 1/17 at around 19:30 she noticed her right hand went numb and then 20 to 30 minutes later her right hand dropped to the side of her body for about 10 minutes.  After 10 minutes she was able to raise her arm and move her hand.  She states that during this time she also had some word finding difficulties which have since improved, though she does report some issues with word finding intermittently since hospitalization.  An MRI brain was obtained revealing scattered small acute infarcts in the left frontal and parietal lobes and neurology was consulted for further evaluation.  LKW: 1/17 19:30 TNK given?: no, patient presented outside of the thrombolytic therapy time window IR Thrombectomy? No, angiography imaging is negative for LVO. Modified Rankin Scale: 0-Completely asymptomatic and back to baseline post- stroke  ROS: A complete ROS was performed and is negative except as noted in the HPI. \  Past Medical History:  Diagnosis Date   Back pain    Cancer ([place])    lung   COPD (chronic obstructive pulmonary disease) ([facility])    Past Surgical History:  Procedure Laterality Date   CESAREAN SECTION     LOBECTOMY     LUNG SURGERY Left    WRIST FRACTURE SURGERY     Family History  Problem Relation Age of Onset   Healthy Mother    Healthy Father    Social History:   reports that she has been smoking cigarettes. She has been smoking an average of 1 pack per day. She has never used  smokeless tobacco. She reports that she does not currently use alcohol. She reports that she does not use drugs.  Medications  Current Facility-Administered Medications:    acetaminophen (TYLENOL) tablet 650 mg, 650 mg, Oral, Q6H PRN, 650 mg at 05/15/21 0836 **OR** acetaminophen (TYLENOL) suppository 650 mg, 650 mg, Rectal, Q6H PRN, Howerter, Justin B, DO   albuterol (PROVENTIL) (2.5 MG/3ML) 0.083% nebulizer solution 2.5 mg, 2.5 mg, Inhalation, Q6H PRN, Thurnell Lose, MD   ALPRAZolam Duanne Moron) tablet 0.25 mg, 0.25 mg, Oral, QHS PRN, Derrill Kay A, MD   amitriptyline (ELAVIL) tablet 50 mg, 50 mg, Oral, QHS, Singh, Prashant K, MD   aspirin chewable tablet 81 mg, 81 mg, Oral, Daily, Lala Lund K, MD, 81 mg at 05/16/21 1010   ceFEPIme (MAXIPIME) 2 g in sodium chloride 0.9 % 100 mL IVPB, 2 g, Intravenous, Q8H, Ledford, James L, RPH, Last Rate: 200 mL/hr at 05/16/21 0549, 2 g at 05/16/21 0549   fluticasone furoate-vilanterol (BREO ELLIPTA) 100-25 MCG/ACT 1 puff, 1 puff, Inhalation, Daily **AND** umeclidinium bromide (INCRUSE ELLIPTA) 62.5 MCG/ACT 1 puff, 1 puff, Inhalation, Daily, Candiss Norse, Prashant K, MD   gabapentin (NEURONTIN) capsule 900 mg, 900 mg, Oral, TID, Derrill Kay A, MD, 900 mg at 05/16/21 1010   heparin injection 5,000 Units, 5,000 Units, Subcutaneous, Q8H, Singh, Prashant K, MD   lurasidone (LATUDA) tablet 60 mg, 60 mg, Oral, Q breakfast, Candiss Norse, Margaree Mackintosh, MD   methylPREDNISolone sodium succinate (  SOLU-MEDROL) 40 mg/mL injection 40 mg, 40 mg, Intravenous, Q24H, Thurnell Lose, MD, 40 mg at 05/16/21 1012   metoCLOPramide (REGLAN) tablet 10 mg, 10 mg, Oral, Daily PRN, Phillips Grout, MD   montelukast (SINGULAIR) tablet 10 mg, 10 mg, Oral, QHS, Derrill Kay A, MD, 10 mg at 05/15/21 2031   nicotine (NICODERM CQ - dosed in mg/24 hours) patch 21 mg, 21 mg, Transdermal, Daily, Sponseller, Rebekah R, PA-C, 21 mg at 05/15/21 7209   oxyCODONE (OXYCONTIN) 12 hr tablet 30 mg, 30 mg,  Oral, BID, Derrill Kay A, MD, 30 mg at 05/16/21 1011   oxyCODONE-acetaminophen (PERCOCET/ROXICET) 5-325 MG per tablet 1-2 tablet, 1-2 tablet, Oral, Q6H PRN, 1 tablet at 05/16/21 1010 **AND** [DISCONTINUED] oxyCODONE (Oxy IR/ROXICODONE) immediate release tablet 5 mg, 5 mg, Oral, Q6H PRN, Howerter, Justin B, DO, 5 mg at 05/15/21 1707   pantoprazole (PROTONIX) EC tablet 40 mg, 40 mg, Oral, Daily, Lala Lund K, MD, 40 mg at 05/16/21 1010   QUEtiapine (SEROQUEL) tablet 50 mg, 50 mg, Oral, QHS, Thurnell Lose, MD   rosuvastatin (CRESTOR) tablet 20 mg, 20 mg, Oral, Daily, Lala Lund K, MD, 20 mg at 05/16/21 1010   vancomycin (VANCOREADY) IVPB 500 mg/100 mL, 500 mg, Intravenous, Q12H, Erenest Blank, RPH, Last Rate: 100 mL/hr at 05/15/21 2321, 500 mg at 05/15/21 2321  Exam: Current vital signs: BP 121/89 (BP Location: Left Arm)    Pulse 100    Temp 98.5 F (36.9 C) (Oral)    Resp 16    Ht 5\' 5"  (1.651 m)    Wt 47.9 kg    SpO2 91%    BMI 17.57 kg/m  Vital signs in last 24 hours: Temp:  [97.8 F (36.6 C)-98.9 F (37.2 C)] 98.5 F (36.9 C) (01/19 0755) Pulse Rate:  [88-114] 100 (01/19 0755) Resp:  [16-23] 16 (01/19 0755) BP: (104-130)/(72-92) 121/89 (01/19 0755) SpO2:  [89 %-96 %] 91 % (01/19 0755) Weight:  [47.9 kg] 47.9 kg (01/19 0647)  GENERAL: Awake, alert, in no acute distress Psych: Affect appropriate for situation, patient is calm and cooperative with examination Head: Normocephalic and atraumatic, without obvious abnormality EENT: Normal conjunctivae, dry mucous membranes, no OP obstruction LUNGS: Normal respiratory effort. Non-labored breathing on room air CV: Sinus tachycardia on telemetry, no pedal edema ABDOMEN: Soft, non-tender, non-distended Extremities: Warm, well perfused, without obvious deformity  NEURO:  Mental Status: Awake, alert, and oriented to person, place, time, and situation. She is able to provide a clear and coherent history of present  illness. Speech/Language: speech is without dysarthria Naming, repetition, fluency, and comprehension intact without aphasia  No neglect is noted Cranial Nerves:  II: PERRL 5 mm/brisk. Visual fields full.  III, IV, VI: EOMI without ptosis, nystagmus, or gaze preference V: Sensation is slightly decreased to light touch on the right face. VII: Face is very mildly weak on the right VIII: Hearing is intact to voice IX, X: Palate elevation is symmetric. Phonation normal.  XI: Normal sternocleidomastoid and trapezius muscle strength XII: Tongue protrudes midline without fasciculations.   Motor: 5/5 strength on the left, she does have mild 4+/5 weakness of the right arm and leg, mild pronation when checking drift on the right Rapid alternating movements slightly impaired on the right. Bilateral lower extremities elevate antigravity without vertical drift.  Tone is normal. Bulk is normal.  Sensation: Patient reports slight decreased sensation to the right hand, face, and right lower extremity to light touch with a more significant decrease  in sensation to the right upper extremity proximal to the hand. She reports a cold sensation of the right upper extremity also with tingling.  Coordination: FTN intact bilaterally. HKS intact bilaterally.Minimal impairment of rapid alternating movements of the right hand compared to the left.  Gait: Deferred  NIHSS: Sensory: 1 Right arm drift: 1 Right face: One TOTAL: 3  Labs I have reviewed labs in epic and the results pertinent to this consultation are: CBC    Component Value Date/Time   WBC 15.5 (H) 05/14/2021 2347   RBC 4.28 05/14/2021 2347   HGB 14.0 05/14/2021 2347   HCT 41.5 05/14/2021 2347   PLT 458 (H) 05/14/2021 2347   MCV 97.0 05/14/2021 2347   MCH 32.7 05/14/2021 2347   MCHC 33.7 05/14/2021 2347   RDW 14.6 05/14/2021 2347   LYMPHSABS 3.5 05/14/2021 2347   MONOABS 1.1 (H) 05/14/2021 2347   EOSABS 0.1 05/14/2021 2347   BASOSABS 0.1  05/14/2021 2347   CMP     Component Value Date/Time   NA 138 05/16/2021 0612   K 5.4 (H) 05/16/2021 0612   CL 104 05/16/2021 0612   CO2 24 05/16/2021 0612   GLUCOSE 134 (H) 05/16/2021 0612   BUN 14 05/16/2021 0612   CREATININE 0.78 05/16/2021 0612   CALCIUM 8.8 (L) 05/16/2021 0612   PROT 6.7 05/14/2021 2347   ALBUMIN 2.6 (L) 05/14/2021 2347   AST 45 (H) 05/14/2021 2347   ALT 51 (H) 05/14/2021 2347   ALKPHOS 153 (H) 05/14/2021 2347   BILITOT 1.7 (H) 05/14/2021 2347   GFRNONAA >60 05/16/2021 0612   Lipid Panel    Component Value Date/Time   CHOL 161 05/16/2021 0612   TRIG 87 05/16/2021 0612   HDL 34 (L) 05/16/2021 0612   CHOLHDL 4.7 05/16/2021 0612   VLDL 17 05/16/2021 0612   LDLCALC 110 (H) 05/16/2021 0612   Lab Results  Component Value Date   HGBA1C 6.0 (H) 05/16/2021   Imaging I have reviewed the images obtained:  CT HEAD IMPRESSION 1/18: 1. Motion degraded exam. 2. No acute intracranial abnormality.   CTA HEAD AND NECK IMPRESSION 1/18: 1. Negative CTA for large vessel occlusion. 2. 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 3. 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 4. Patchy multifocal opacities within the partially visualized left lung, suspicious for infection/pneumonia. 5. Emphysema (ICD10-J43.9).  MRI examination of the brain 1/18: Scattered small acute infarcts in the left frontal and parietal lobes involving the corona radiata, parietal lobe cortex, and pre and postcentral gyri in the MCA distribution.  Assessment: 51 y.o. female who presented to the ED 1/17 for evaluation of right upper extremity weakness and sensory deficits and was found to have multifocal left MCA territory infarcts.  I suspect that she had an embolus that broke up, and though no clear source was found on echo, I would consider getting a transesophageal echocardiogram.  Recommendations: - Statin for LDL goal of < 70 -  Frequent neuro checks - Echocardiogram pending - Prophylactic therapy- Antiplatelet med: Aspirin - dose 325mg  PO or 300mg  PR followed by ASA 81 mg PO daily Plavix 85 mg daily - Risk factor modification - Telemetry monitoring - PT consult, OT consult - Stroke team to follow  Pt seen by NP/Neuro and later by MD. Note/plan to be edited by MD as needed.  Anibal Henderson, AGAC-NP [facility] Pager: [phone]  I have  seen the patient reviewed the above note.  I am concerned for possible embolic stroke.  With her young age, I do think transesophageal echocardiogram would be prudent.  Also, though no irregularities have been found thus far, I would favor prolonged outpatient cardiac monitoring.  I have made the patient n.p.o. in anticipation of possible echocardiogram, but if unable to perform it tomorrow or deemed unnecessary by the stroke service, then would reinstitute diet.  Stroke team to follow.  Roland Rack, MD [facility] [phone]  If 7pm- 7am, please page neurology on call as listed in [place].

## 2021-05-16 NOTE — Evaluation (Signed)
Clinical/Bedside Swallow Evaluation Patient Details  Name: Courtney Mayer MRN: [id number] Date of Birth: 1970/11/03  Today's Date: 05/16/2021 Time: SLP Start Time (ACUTE ONLY): 22 SLP Stop Time (ACUTE ONLY): 0935 SLP Time Calculation (min) (ACUTE ONLY): 15 min  Past Medical History:  Past Medical History:  Diagnosis Date   Back pain    Cancer ([place])    lung   COPD (chronic obstructive pulmonary disease) ([place])    Past Surgical History:  Past Surgical History:  Procedure Laterality Date   CESAREAN SECTION     LOBECTOMY     LUNG SURGERY Left    WRIST FRACTURE SURGERY     HPI:  History of Present Illness    51 y.o. female comes to the emergency department 05/15/21 because of shortness of breath and right upper extremity weakness. Acute exacerbation COPD; MRI brain Scattered small acute infarcts in the left frontal and parietal lobes;  PMH significant of COPD, tobacco abuse, chronic pain    Assessment / Plan / Recommendation  Clinical Impression  Patient is not demonstrating a clinical dysphagia as per this bedside swallow evaluation. Patient denies any swallowing difficulty prior to and during this hospitalization. Her voice was strong and clear, swallow initiation appeared timely and no overt s/s aspiration or penetration. SpO2 remained in range of 88-91% throughout this assessment. SLP not recommending f/u ST for swallow function. SLP Visit Diagnosis: Dysphagia, unspecified (R13.10)    Aspiration Risk  No limitations    Diet Recommendation Regular;Thin liquid   Liquid Administration via: Cup;Straw Medication Administration: Whole meds with liquid Supervision: Patient able to self feed Postural Changes: Seated upright at 90 degrees    Other  Recommendations Oral Care Recommendations: Oral care BID;Patient independent with oral care    Recommendations for follow up therapy are one component of a multi-disciplinary discharge planning process, led by the attending physician.   Recommendations may be updated based on patient status, additional functional criteria and insurance authorization.  Follow up Recommendations No SLP follow up      Assistance Recommended at Discharge None  Functional Status Assessment Patient has not had a recent decline in their functional status  Frequency and Duration     N/A       Prognosis   N/A     Swallow Study   General Date of Onset: 05/15/21 HPI: History of Present Illness    51 y.o. female comes to the emergency department 05/15/21 because of shortness of breath and right upper extremity weakness. Acute exacerbation COPD; MRI brain Scattered small acute infarcts in the left frontal and parietal lobes;  PMH significant of COPD, tobacco abuse, chronic pain Type of Study: Bedside Swallow Evaluation Previous Swallow Assessment: none found Diet Prior to this Study: Regular;Thin liquids Temperature Spikes Noted: No Respiratory Status: Room air;Other (comment) (oxygen in range 88-91%) History of Recent Intubation: No Behavior/Cognition: Alert;Cooperative;Pleasant mood Oral Cavity Assessment: Within Functional Limits;Other (comment) (patient reported initially having some sores in mouth but that they have healed) Oral Care Completed by SLP: No Oral Cavity - Dentition: Adequate natural dentition Vision: Functional for self-feeding Self-Feeding Abilities: Able to feed self Patient Positioning: Upright in bed Baseline Vocal Quality: Normal Volitional Cough: Strong Volitional Swallow: Able to elicit    Oral/Motor/Sensory Function Overall Oral Motor/Sensory Function: Within functional limits   Ice Chips     Thin Liquid Thin Liquid: Within functional limits Presentation: Straw;Self Fed    Nectar Thick     Honey Thick     Puree Puree: Not tested  Solid     Solid: Not tested      Sonia Baller, MA, CCC-SLP Speech Therapy

## 2021-05-17 ENCOUNTER — Inpatient Hospital Stay (HOSPITAL_COMMUNITY): Payer: Managed Care, Other (non HMO)

## 2021-05-17 DIAGNOSIS — R29898 Other symptoms and signs involving the musculoskeletal system: Secondary | ICD-10-CM

## 2021-05-17 DIAGNOSIS — I6330 Cerebral infarction due to thrombosis of unspecified cerebral artery: Secondary | ICD-10-CM | POA: Insufficient documentation

## 2021-05-17 DIAGNOSIS — I639 Cerebral infarction, unspecified: Secondary | ICD-10-CM

## 2021-05-17 HISTORY — DX: Cerebral infarction due to thrombosis of unspecified cerebral artery: I63.30

## 2021-05-17 LAB — COMPREHENSIVE METABOLIC PANEL
ALT: 40 U/L (ref 0–44)
AST: 29 U/L (ref 15–41)
Albumin: 2.5 g/dL — ABNORMAL LOW (ref 3.5–5.0)
Alkaline Phosphatase: 106 U/L (ref 38–126)
Anion gap: 11 (ref 5–15)
BUN: 17 mg/dL (ref 6–20)
CO2: 24 mmol/L (ref 22–32)
Calcium: 8.6 mg/dL — ABNORMAL LOW (ref 8.9–10.3)
Chloride: 106 mmol/L (ref 98–111)
Creatinine, Ser: 0.76 mg/dL (ref 0.44–1.00)
GFR, Estimated: >60 mL/min (ref >60)
Glucose, Bld: 96 mg/dL (ref 70–99)
Potassium: 3.5 mmol/L (ref 3.5–5.1)
Sodium: 141 mmol/L (ref 135–145)
Total Bilirubin: 0.2 mg/dL — ABNORMAL LOW (ref 0.3–1.2)
Total Protein: 6 g/dL — ABNORMAL LOW (ref 6.5–8.1)

## 2021-05-17 LAB — CBC WITH DIFFERENTIAL/PLATELET
Abs Immature Granulocytes: 0 10*3/uL (ref 0.00–0.07)
Basophils Absolute: 0 10*3/uL (ref 0.0–0.1)
Basophils Relative: 0 %
Eosinophils Absolute: 0 10*3/uL (ref 0.0–0.5)
Eosinophils Relative: 0 %
HCT: 35.3 % — ABNORMAL LOW (ref 36.0–46.0)
Hemoglobin: 11.4 g/dL — ABNORMAL LOW (ref 12.0–15.0)
Lymphocytes Relative: 23 %
Lymphs Abs: 3.7 10*3/uL (ref 0.7–4.0)
MCH: 31.5 pg (ref 26.0–34.0)
MCHC: 32.3 g/dL (ref 30.0–36.0)
MCV: 97.5 fL (ref 80.0–100.0)
Monocytes Absolute: 0.6 10*3/uL (ref 0.1–1.0)
Monocytes Relative: 4 %
Neutro Abs: 11.8 10*3/uL — ABNORMAL HIGH (ref 1.7–7.7)
Neutrophils Relative %: 73 %
Platelets: 489 10*3/uL — ABNORMAL HIGH (ref 150–400)
RBC: 3.62 MIL/uL — ABNORMAL LOW (ref 3.87–5.11)
RDW: 14.5 % (ref 11.5–15.5)
WBC: 16.2 10*3/uL — ABNORMAL HIGH (ref 4.0–10.5)
nRBC: 0 % (ref 0.0–0.2)
nRBC: 0 /100 WBC

## 2021-05-17 LAB — CULTURE, BLOOD (ROUTINE X 2): Special Requests: ADEQUATE

## 2021-05-17 LAB — MAGNESIUM: Magnesium: 1.9 mg/dL (ref 1.7–2.4)

## 2021-05-17 LAB — PROCALCITONIN: Procalcitonin: <0.1 ng/mL

## 2021-05-17 MED ORDER — PREDNISONE 5 MG PO TABS: ORAL_TABLET | ORAL | 0 refills | Status: DC

## 2021-05-17 MED ORDER — ROSUVASTATIN CALCIUM 20 MG PO TABS: 20.0000 mg | ORAL_TABLET | Freq: Every day | ORAL | 0 refills | Status: DC

## 2021-05-17 MED ORDER — NICOTINE 21 MG/24HR TD PT24: 21.0000 mg | MEDICATED_PATCH | Freq: Every day | TRANSDERMAL | 0 refills | Status: DC

## 2021-05-17 MED ORDER — CLOPIDOGREL BISULFATE 75 MG PO TABS: 75.0000 mg | ORAL_TABLET | Freq: Every day | ORAL | 0 refills | Status: AC

## 2021-05-17 MED ORDER — CLOPIDOGREL BISULFATE 75 MG PO TABS
75.0000 mg | ORAL_TABLET | Freq: Every day | ORAL | Status: DC
  Administered 2021-05-17: 75 mg via ORAL
  Filled 2021-05-17: qty 1

## 2021-05-17 MED ORDER — ASPIRIN 81 MG PO CHEW: 81.0000 mg | CHEWABLE_TABLET | Freq: Every day | ORAL | 0 refills | Status: DC

## 2021-05-17 NOTE — Progress Notes (Addendum)
STROKE TEAM PROGRESS NOTE   INTERVAL HISTORY  Patient was seen and assessed sitting up in bed. Reports having right hand weakness, right sided tingling and numbness of her body,could not think of letters to text and she dropped something and was unable to pick it up, that was when she called 911, palpitation and all this symptoms are improving she states. She is a Librarian, academic at [employer]. Reports that are symptoms have resolved and was asking if she could go home today. Dr Leonie Man explained to patient in  details results of her neuroimaging, labs, smoking cessation and plan of care and she verbalized understanding and she promise to quit smoking with her mother also. MRI scan of the brain shows large embolic left MCA branch infarcts. Vitals:   05/17/21 0600 05/17/21 0757 05/17/21 0854 05/17/21 1200  BP:  122/85  129/88  Pulse: 83     Resp:  18  18  Temp:  98.3 F (36.8 C)  98.6 F (37 C)  TempSrc:  Oral  Oral  SpO2: 91%  91%   Weight:      Height:       CBC:  Recent Labs  Lab 05/14/21 2347 05/17/21 0139  WBC 15.5* 16.2*  NEUTROABS 10.5* 11.8*  HGB 14.0 11.4*  HCT 41.5 35.3*  MCV 97.0 97.5  PLT 458* 353*   Basic Metabolic Panel:  Recent Labs  Lab 05/16/21 0612 05/17/21 0139  NA 138 141  K 5.4* 3.5  CL 104 106  CO2 24 24  GLUCOSE 134* 96  BUN 14 17  CREATININE 0.78 0.76  CALCIUM 8.8* 8.6*  MG 1.9 1.9   Lipid Panel:  Recent Labs  Lab 05/16/21 0612  CHOL 161  TRIG 87  HDL 34*  CHOLHDL 4.7  VLDL 17  LDLCALC 110*   HgbA1c:  Recent Labs  Lab 05/16/21 0612  HGBA1C 6.0*   Urine Drug Screen:  Recent Labs  Lab 05/14/21 2347  LABOPIA POSITIVE*  COCAINSCRNUR NONE DETECTED  LABBENZ NONE DETECTED  AMPHETMU NONE DETECTED  THCU NONE DETECTED  LABBARB NONE DETECTED    Alcohol Level No results for input(s): ETH in the last 168 hours.  IMAGING past 24 hours No results found.  PHYSICAL EXAM   Physical Exam  Constitutional: Appears  well-developed and well-nourished.  Psych: Affect appropriate to situation Eyes: No scleral injection HENT: No OP obstrucion MSK: no joint deformities.  Cardiovascular: Normal rate and regular rhythm.  Respiratory: Effort normal, non-labored breathing GI: Soft.  No distension. There is no tenderness.  Skin: WDI  Neuro: Mental Status: Patient is awake, alert, oriented to person, place, month, year, and situation. Patient is able to give a clear and coherent history. No signs of aphasia or neglect Cranial Nerves: II: Visual Fields are full. Pupils are equal, round, and reactive to light.   III,IV, VI: EOMI without ptosis or diploplia.  V: Facial sensation is symmetric to temperature VII: Facial movement is symmetric resting and smiling VIII: Hearing is intact to voice X: Palate elevates symmetrically XI: Shoulder shrug is symmetric. XII: Tongue protrudes midline without atrophy or fasciculations.  Motor: Tone is normal. Bulk is normal. 5/5 strength was present in all four extremities.  Rapid alternating movements improving Sensory: Sensation is symmetric to light touch and temperature in the arms and legs.  Coordination:FNF  and HKS are intact bilaterally Gait : Able to stand up without any assistance  NIHSS : 0     ASSESSMENT/PLAN Ms. Iver Fehrenbach is  a 51 y.o. female with with medical history significant for left lung cancer s/p lobectomy in 2017, history of PE previously on Xarelto, COPD, and tobacco use "who presented to [facility] emergency room on 05/14/2021 for evaluation of right arm weakness and numbness.  Patient states that on 05/14/2021 at around 19:30 she noticed her right hand went numb and then 20 to 30 minutes later her right hand dropped to the side of her body for about 10 minutes. After 10 minutes she was able to raise her arm and move her hand.  She states that during this time she also had some word finding difficulties which have since improved, though she does report  some issues with word finding intermittently since hospitalization.  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   CTA head & neck done 1/18 shows Negative CTA for large vessel occlusion. 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 Marland Kitchen Bilateral MCA bifurcation aneurysms, measuring up to 4 mm on the left and 3 mm on the right. 4. Patchy multifocal opacities within the partially visualized left lung, suspicious for infection/pneumonia. 5. Emphysema  MRI  Head w wo contrast done on 1/18 shows Scattered small acute infarcts in the left frontal and parietal lobes involving the corona radiata, parietal lobe cortex and pre and postcentral gyri in the MCA distribution. EKG shows sinus tachycardia.     2D Echo  done on 05/16/2021 with ejection fraction of 70-75% with no regional wall motion abnormalities.No bubble study. Scheduled for outpatient TEE  with bubble study  and Loop implant placement since this is an embolic stroke. LDL 110 HgbA1c 6.0 VTE prophylaxis - Heparin 5000units SQ every 8 hours  Bilateral Lower extremity doppler is ordered to evaluate for DVT    Diet   Diet Heart Room service appropriate? Yes; Fluid consistency: Thin   No antithrombotic prior to admission, now on aspirin 81 mg daily and clopidogrel 75 mg daily. X 21 days thereafter will be on aspirin 81 mg orally daily except Afib/Aflutter is captured on Loop recorder then she will be started on anticoagulation and be off antiplatelets. Therapy recommendations:  No skilled needs per PT/OT Disposition:  Likely home since most of her symptoms have resolved.  Hypertension Home meds:  Not on any BP medications at home Stable Permissive hypertension (OK if < 180/90) but  gradually normalize in 5-7 days Long-term BP goal normotensive  Hyperlipidemia Home meds:  Not on statins LDL 110, goal < 70 Add Lipitor 80mg  orally @ bedtime High intensity statin indicated  Continue statin at discharge   No history of Diabetes type II Controlled Home meds:  Not on any DM medications HgbA1c 6.0, goal < 7.0 No results for input(s): GLUCAP in the last 72 hours.    Other Stroke Risk Factors Cigarette smoker and advised to stop smoking and on Nico Derm patch  History Left lung cancer s/p lobectomy History of left PE and was Xarelto for 6 months  Other Active Problems  Recent history of Pneumonia : Chest x-ray on this admission  shows  Interstitial and airspace opacities in the mid to lower left lung field, possible developing pneumonia.Being followed by primary team   Chronic anxiety and depression : On home psych medications.  Hospital day # 2  VERONICA Darral Dash  05/17/21 /.2:13 PM   STROKE MD NOTE :  I have personally obtained history,examined this patient, reviewed notes, independently viewed imaging studies, participated in medical decision making and plan of care.ROS completed by me personally and pertinent positives fully documented  I have made any additions or clarifications directly to the above note. Agree with note above.  Patient presented sudden onset of right hand weakness paresthesias and see clumsiness secondary to embolic left MCA branch infarct cryptogenic etiology.  Recommend ongoing stroke work-up check TEE and loop recorder for paroxysmal A. fib and check hypercoagulable panel labs but this can be scheduled as an outpatient as patient minimal deficits and wants to go home.  Check TCD bubble study to look for PFO lower extremity venous Dopplers for DVT.  Recommend aspirin and Plavix for 3 weeks followed by aspirin alone.  Patient counseled to quit working and she is willing.  Greater than 50% time during this 50-minute visit was spent on counseling  and coordination of care about her cryptogenic stroke and discussion about stroke evaluation, prevention, treatment and answering questions.  Discussed with Dr. Candiss Norse follow-up as an outpatient stroke clinic in 2 months  Antony Contras, MD Medical Director [place] Pager: [phone] 05/17/2021 3:57 PM  To contact Stroke Continuity provider, please refer to [email or web address] After hours, contact General Neurology

## 2021-05-17 NOTE — Progress Notes (Signed)
RN at bedside. Patient appears calm and free of pain. Patient AVS given per discharge orders. All questions by patient answered by RN. Telemonitoring connections removed; IV connections removed. Agricultural consultant notified

## 2021-05-17 NOTE — Discharge Instructions (Signed)
Follow with Primary MD Gildardo Cranker, MD in 7 days   Get CBC, CMP, 2 view Chest X ray -  checked next visit within 1 week by Primary MD   Activity: As tolerated with Full fall precautions use walker/cane & assistance as needed  Disposition Home  Diet: Heart Healthy    Special Instructions: If you have smoked or chewed Tobacco  in the last 2 yrs please stop smoking, stop any regular Alcohol  and or any Recreational drug use.  On your next visit with your primary care physician please Get Medicines reviewed and adjusted.  Please request your Prim.MD to go over all Hospital Tests and Procedure/Radiological results at the follow up, please get all Hospital records sent to your Prim MD by signing hospital release before you go home.  If you experience worsening of your admission symptoms, develop shortness of breath, life threatening emergency, suicidal or homicidal thoughts you must seek medical attention immediately by calling 911 or calling your MD immediately  if symptoms less severe.  You Must read complete instructions/literature along with all the possible adverse reactions/side effects for all the Medicines you take and that have been prescribed to you. Take any new Medicines after you have completely understood and accpet all the possible adverse reactions/side effects.

## 2021-05-17 NOTE — Plan of Care (Addendum)
°  °                                    [facility]                            [address]. [place], [place] [address]      Courtney Mayer was admitted to the Hospital on 05/14/2021 and Discharged  05/17/2021 and should be excused from work/school   for 6  days starting from date -  05/14/2021 , may return to work/school without any restrictions.  Call Lala Lund MD, [facility]  [phone] with questions.  Lala Lund M.D on 05/17/2021,at 4:53 PM  [facility]   Office  [phone]

## 2021-05-17 NOTE — Plan of Care (Signed)

## 2021-05-17 NOTE — Discharge Summary (Signed)
Courtney Mayer RRN:[id number] DOB: 1970-06-08 DOA: 05/14/2021  PCP: Gildardo Cranker, MD  Admit date: 05/14/2021  Discharge date: 05/17/2021  Admitted From: Home   Disposition:  Home   Recommendations for Outpatient Follow-up:   Follow up with PCP in 1-2 weeks  PCP Please obtain BMP/CBC, 2 view CXR in 1week,  (see Discharge instructions)   PCP Please follow up on the following pending results:    Home Health: None   Equipment/Devices: None  Consultations: Neuro Discharge Condition: Stable    CODE STATUS: Full    Diet Recommendation: Heart Healthy   Diet Order             Diet Heart Room service appropriate? Yes; Fluid consistency: Thin  Diet effective now           Diet - low sodium heart healthy                    Chief Complaint  Patient presents with   Shortness of Breath   Weakness     Brief history of present illness from the day of admission and additional interim summary    Courtney Mayer is a 51 y.o. female with medical history significant of COPD, tobacco abuse, chronic pain comes to the emergency department because of shortness of breath and right upper extremity weakness.  She is also recovering from recent diagnosis of pneumonia.  She was admitted to the hospital for right arm weakness work-up and unresolved pneumonialike symptoms                                                                 Hospital Course       Acute infarcts in the left frontal and parietal lobes involving the corona radiata, parietal lobe cortex, and pre and postcentral gyri in the MCA distribution - causing right-sided tingling and mild subjective weakness, symptoms improved, placed on aspirin, Plavix and statin, her LDL was above goal and A1c was acceptable.  She had an echocardiogram which was unremarkable  CTA noted.  Case discussed with stroke team physician Dr. Leonie Man.  Plavix for 21 days, continue aspirin and statin indefinitely, outpatient neuro follow-up, strictly counseled to quit smoking, symptoms almost completely resolved.   2.  Recent history of pneumonia.  Clinically stable chest x-ray not very impressive, no productive cough or fever, no shortness of breath currently on room air, I think she had acute bronchitis with COPD exacerbation, no further antibiotics, she has recently finished 7 days of Levaquin in the outpatient treatment, will get prednisone taper with outpatient PCP follow-up again counseled to quit smoking oral steroid taper upon discharge.   3.  Recent diagnosis of COPD exacerbation.  No wheezing, no oxygen demand, supportive care, counseled to quit smoking, rapidly taper off steroids. She has steroid induced  Leukocytosis, afebrile.   4.  Ongoing smoking.  Counseled to quit, NicoDerm patch.   5.  Chronic back pain with narcotic dependence.  Home narcotics.   6.  Chronic anxiety and depression.  Home psych medications.   7.  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  Kindly see #1 above, risk factor modification with outpatient PCP follow-up.   8.  Dyslipidemia.  Placed on statin.      Discharge diagnosis     Principal Problem:   Acute exacerbation of chronic obstructive pulmonary disease (COPD) ([place]) Active Problems:   Acute respiratory failure with hypoxia (HCC)   RUE weakness   Cerebral thrombosis with cerebral infarction    Discharge instructions    Discharge Instructions     Diet - low sodium heart healthy   Complete by: As directed    Discharge instructions   Complete by: As directed    Follow with Primary MD Gildardo Cranker, MD in 7 days   Get CBC, CMP, 2 view Chest X ray -  checked next visit within 1 week by Primary MD   Activity: As tolerated with Full fall precautions use walker/cane & assistance as needed  Disposition Home  Diet: Heart Healthy    Special  Instructions: If you have smoked or chewed Tobacco  in the last 2 yrs please stop smoking, stop any regular Alcohol  and or any Recreational drug use.  On your next visit with your primary care physician please Get Medicines reviewed and adjusted.  Please request your Prim.MD to go over all Hospital Tests and Procedure/Radiological results at the follow up, please get all Hospital records sent to your Prim MD by signing hospital release before you go home.  If you experience worsening of your admission symptoms, develop shortness of breath, life threatening emergency, suicidal or homicidal thoughts you must seek medical attention immediately by calling 911 or calling your MD immediately  if symptoms less severe.  You Must read complete instructions/literature along with all the possible adverse reactions/side effects for all the Medicines you take and that have been prescribed to you. Take any new Medicines after you have completely understood and accpet all the possible adverse reactions/side effects.   Increase activity slowly   Complete by: As directed        Discharge Medications   Allergies as of 05/17/2021       Reactions   Tramadol Palpitations   Can't take Ultram ER - palpitations Regular Tramadol is fine to take Can't take Ultram ER - palpitations Regular Tramadol is fine to take   Amoxicillin Diarrhea   Compazine [prochlorperazine] Other (See Comments)   Excitability   Compazine [prochlorperazine] Anxiety        Medication List     STOP taking these medications    predniSONE 10 MG tablet Commonly known as: DELTASONE Replaced by: predniSONE 5 MG tablet       TAKE these medications    albuterol 108 (90 Base) MCG/ACT inhaler Commonly known as: VENTOLIN HFA Inhale 2 puffs into the lungs every 6 (six) hours as needed for wheezing or shortness of breath.   ALPRAZolam 0.25 MG tablet Commonly known as: XANAX Take 0.25 mg by mouth at bedtime as needed for  anxiety.   amitriptyline 150 MG tablet Commonly known as: ELAVIL Take 150 mg by mouth at bedtime. What changed: Another medication with the same name was removed. Continue taking this medication, and follow the directions you see here.   aspirin 81 MG chewable tablet Chew 1  tablet (81 mg total) by mouth daily. Start taking on: May 18, 2021   clopidogrel 75 MG tablet Commonly known as: PLAVIX Take 1 tablet (75 mg total) by mouth daily for 20 doses. Start taking on: May 18, 2021   gabapentin 600 MG tablet Commonly known as: NEURONTIN Take 900 mg by mouth 3 (three) times daily.   Latuda 60 MG Tabs Generic drug: Lurasidone HCl Take 60 mg by mouth daily.   levofloxacin 750 MG tablet Commonly known as: LEVAQUIN Take 750 mg by mouth daily.   methocarbamol 750 MG tablet Commonly known as: ROBAXIN Take 1,500 mg by mouth 4 (four) times daily.   metoCLOPramide 10 MG tablet Commonly known as: REGLAN Take 10 mg by mouth daily as needed for nausea.   montelukast 10 MG tablet Commonly known as: SINGULAIR Take 10 mg by mouth at bedtime.   multivitamin with minerals Tabs tablet Take 1 tablet by mouth daily.   nicotine 21 mg/24hr patch Commonly known as: NICODERM CQ - dosed in mg/24 hours Place 1 patch (21 mg total) onto the skin daily. Start taking on: May 18, 2021   oxyCODONE-acetaminophen 10-325 MG tablet Commonly known as: PERCOCET Take 1 tablet by mouth 4 (four) times daily as needed for pain.   OxyCONTIN 30 MG 12 hr tablet Generic drug: oxyCODONE Take 30 tablets by mouth 2 (two) times daily.   pantoprazole 40 MG tablet Commonly known as: PROTONIX Take 40 mg by mouth daily.   predniSONE 5 MG tablet Commonly known as: DELTASONE Label  & dispense according to the schedule below. take 6 Pills PO for 3 days, 4 Pills PO for 3 days, 2 Pills PO for 3 days, 1 Pills PO for 3 days, 1/2 Pill  PO for 3 days then STOP. Total 45 pills. Replaces: predniSONE 10 MG  tablet   QUEtiapine 50 MG tablet Commonly known as: SEROQUEL Take 50 mg by mouth at bedtime.   rosuvastatin 20 MG tablet Commonly known as: CRESTOR Take 1 tablet (20 mg total) by mouth daily. Start taking on: May 18, 2021   Trelegy Ellipta 100-62.5-25 MCG/ACT Aepb Generic drug: Fluticasone-Umeclidin-Vilant Inhale 1 puff into the lungs daily.         Follow-up Information     Gildardo Cranker, MD. Schedule an appointment as soon as possible for a visit in 1 week(s).   Specialty: Internal Medicine Contact information: [address] [place] [address] [phone]         [facility]. Schedule an appointment as soon as possible for a visit in 1 week(s).   Contact information: [address]     [place]  [id number] [phone]        Shirley Friar, PA-C Follow up.   Specialty: Physician Assistant Why: 05/30/21@ 10:20AM, to discuss heart rhythm monitoring and further work up after you stroke Contact information: [address] [place] [address] [phone]                 Major procedures and Radiology Reports - PLEASE review detailed and final reports thoroughly  -       CT ANGIO HEAD NECK W WO CM  Result Date: 05/15/2021 CLINICAL DATA:  Initial evaluation for neuro deficit, stroke. EXAM: CT ANGIOGRAPHY HEAD AND NECK TECHNIQUE: Multidetector CT imaging of the head and neck was performed using the standard protocol during bolus administration of intravenous contrast. Multiplanar CT image reconstructions and MIPs were obtained to evaluate  the vascular anatomy. Carotid stenosis measurements (when applicable) are obtained utilizing NASCET criteria, using the distal internal carotid diameter as the denominator. RADIATION DOSE REDUCTION: This exam was performed according to the departmental dose-optimization program which includes automated exposure control, adjustment of the mA  and/or kV according to patient size and/or use of iterative reconstruction technique. CONTRAST:  76mL OMNIPAQUE IOHEXOL 350 MG/ML SOLN COMPARISON:  Prior study from 01/15/2021. FINDINGS: CT HEAD FINDINGS Brain: Examination degraded by motion artifact. Cerebral volume within normal limits. No acute intracranial hemorrhage. No visible acute large vessel territory infarct. No mass lesion, midline shift or mass effect. No hydrocephalus or extra-axial fluid collection. Vascular: No visible hyperdense vessel. Skull: Scalp soft tissues demonstrate no acute finding. Calvarium grossly intact. Sinuses: Paranasal sinuses and mastoid air cells are grossly clear. Orbits: No obvious abnormality about the globes orbital soft tissues. Review of the MIP images confirms the above findings CTA NECK FINDINGS Aortic arch: A shin examination degraded by motion. Visualized aortic arch normal caliber with normal branch pattern. Mild plaque within the arch itself. No stenosis about the origin the great vessels. Right carotid system: Right common and internal carotid arteries patent without stenosis or dissection. Left carotid system: Left CCA patent from its origin to the bifurcation without stenosis. Eccentric soft plaque at the left carotid bulb/proximal left ICA with associated stenosis of up to approximately 50% by NASCET criteria. Left ICA patent distally without stenosis or dissection. Vertebral arteries: Both vertebral arteries arise from subclavian arteries. No proximal subclavian artery stenosis. Right vertebral artery dominant. Vertebral arteries patent without stenosis or dissection. Skeleton: No discrete or worrisome osseous lesions. Mild chronic height loss noted at the superior endplates of T2 through T4. Other neck: No other acute soft tissue abnormality within the neck. Upper chest: Scattered patchy multifocal opacities seen within the partially visualized left lung, suspicious for possible infection/pneumonia. Underlying  centrilobular emphysema. Review of the MIP images confirms the above findings CTA HEAD FINDINGS Anterior circulation: Both internal carotid arteries patent to the termini without stenosis. A1 segments patent bilaterally. Normal anterior communicating artery complex. Anterior cerebral arteries widely patent bilaterally. No M1 stenosis or occlusion. 4 mm saccular aneurysm seen at the left MCA bifurcation (series 11, image 102). 3 mm saccular aneurysm present at the contralateral right MCA bifurcation (series 11, image 104). Distal MCA branches well perfused and symmetric. Additional Posterior circulation: Both V4 segments patent to the vertebrobasilar junction without stenosis. Both PICA origins patent and normal. Basilar patent to its distal aspect without stenosis. Superior cerebellar and posterior cerebral arteries widely patent bilaterally. Venous sinuses: Patent allowing for timing of contrast bolus. Anatomic variants: None significant. Review of the MIP images confirms the above findings IMPRESSION: CT HEAD IMPRESSION: 1. Motion degraded exam. 2. No acute intracranial abnormality. CTA HEAD AND NECK IMPRESSION: 1. Negative CTA for large vessel occlusion. 2. 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 3. 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 4. Patchy multifocal opacities within the partially visualized left lung, suspicious for infection/pneumonia. 5. Emphysema (ICD10-J43.9). Electronically Signed   By: Jeannine Boga M.D.   On: 05/15/2021 04:39   MR Brain W and Wo Contrast  Result Date: 05/15/2021 CLINICAL DATA:  Right arm weakness since 10/30 last night EXAM: MRI HEAD WITHOUT AND WITH CONTRAST TECHNIQUE: Multiplanar, multiecho pulse sequences of the brain and surrounding structures were obtained without and with intravenous contrast. CONTRAST:  24mL GADAVIST GADOBUTROL 1  MMOL/ML IV SOLN COMPARISON:  Same-day CT/CTA head and neck FINDINGS:  Brain: There are multiple small infarcts in the left frontal and parietal lobes involving the corona radiata, parietal lobe cortex, and pre and postcentral gyri. There is no associated hemorrhage or mass effect. There is no acute intracranial hemorrhage or extra-axial fluid collection. Background parenchymal volume is normal. The ventricles are normal in size. Parenchymal signal is otherwise normal. There is no mass lesion or abnormal enhancement. There is no midline shift. Vascular: Normal flow voids. Skull and upper cervical spine: Normal marrow signal. Sinuses/Orbits: The paranasal sinuses are clear. The globes and orbits are unremarkable. Other: None. 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 Electronically Signed   By: Valetta Mole M.D.   On: 05/15/2021 08:12   DG Chest Port 1 View  Result Date: 05/15/2021 CLINICAL DATA:  Possible sepsis.  Right arm pain. EXAM: PORTABLE CHEST 1 VIEW COMPARISON:  01/15/2021. FINDINGS: The heart size and mediastinal contours are stable. Apical pleural scarring and postsurgical changes are noted in the left upper lobe. There is elevation of the left diaphragm with patchy interstitial and airspace opacities in the mid to lower left lung field. The right lung is clear. No effusion or pneumothorax. No acute osseous abnormality. IMPRESSION: 1. Interstitial and airspace opacities in the mid to lower left lung field, possible developing pneumonia. 2. Stable postsurgical changes and apical pleural thickening on the left. Electronically Signed   By: Brett Fairy M.D.   On: 05/15/2021 00:22   VAS [place] TRANSCRANIAL DOPPLER W BUBBLES  Result Date: 05/17/2021  Transcranial Doppler with Bubble Patient Name:  Courtney Mayer  Date of Exam:   05/17/2021 Medical Rec #: [id number]        Accession #:    [id number] Date of Birth: 1970-09-22        Patient Gender: F Patient Age:   16 years Exam Location:  [facility] Procedure:      VAS [place] TRANSCRANIAL DOPPLER W BUBBLES Referring Phys: PRAMOD SETHI --------------------------------------------------------------------------------  Indications: Stroke. Performing Technologist: Rogelia Rohrer RVT, RDMS  Examination Guidelines: A complete evaluation includes B-mode imaging, spectral Doppler, color Doppler, and power Doppler as needed of all accessible portions of each vessel. Bilateral testing is considered an integral part of a complete examination. Limited examinations for reoccurring indications may be performed as noted.    Preliminary    ECHOCARDIOGRAM COMPLETE  Result Date: 05/16/2021    ECHOCARDIOGRAM REPORT   Patient Name:   Courtney Mayer Date of Exam: 05/16/2021 Medical Rec #:  [id number]       Height:       65.0 in Accession #:    [id number]      Weight:       105.6 lb Date of Birth:  16-Nov-1970      BSA:          1.508 m Patient Age:    65 years        BP:           121/89 mmHg Patient Gender: F               HR:           100 bpm. Exam Location:  Inpatient Procedure: 2D Echo Indications:    Stroke  History:        Patient has no prior history of Echocardiogram examinations.  COPD.  Sonographer:    Arlyss Gandy Referring Phys: Arnell Asal Margaree Mackintosh [place]  1. Left ventricular ejection fraction, by estimation, is 70 to 75%. The left ventricle has hyperdynamic function. The left ventricle has no regional wall motion abnormalities. Left ventricular diastolic parameters are consistent with Grade I diastolic dysfunction (impaired relaxation).  2. Right ventricular systolic function is normal. The right ventricular size is normal. Tricuspid regurgitation signal is inadequate for assessing PA pressure.  3. The mitral valve is normal in structure. Trivial mitral valve regurgitation. No evidence of mitral stenosis.  4. The aortic valve is tricuspid. Aortic valve regurgitation is not  visualized. Aortic valve sclerosis is present, with no evidence of aortic valve stenosis.  5. The inferior vena cava is normal in size with greater than 50% respiratory variability, suggesting right atrial pressure of 3 mmHg. Comparison(s): No prior Echocardiogram. FINDINGS  Left Ventricle: Left ventricular ejection fraction, by estimation, is 70 to 75%. The left ventricle has hyperdynamic function. The left ventricle has no regional wall motion abnormalities. The left ventricular internal cavity size was normal in size. There is no left ventricular hypertrophy. Left ventricular diastolic parameters are consistent with Grade I diastolic dysfunction (impaired relaxation). Right Ventricle: The right ventricular size is normal. Right ventricular systolic function is normal. Tricuspid regurgitation signal is inadequate for assessing PA pressure. The tricuspid regurgitant velocity is 2.52 m/s, and with an assumed right atrial  pressure of 3 mmHg, the estimated right ventricular systolic pressure is 19.3 mmHg. Left Atrium: Left atrial size was normal in size. Right Atrium: Right atrial size was normal in size. Pericardium: There is no evidence of pericardial effusion. Mitral Valve: The mitral valve is normal in structure. Trivial mitral valve regurgitation. No evidence of mitral valve stenosis. Tricuspid Valve: The tricuspid valve is normal in structure. Tricuspid valve regurgitation is trivial. No evidence of tricuspid stenosis. Aortic Valve: The aortic valve is tricuspid. Aortic valve regurgitation is not visualized. Aortic valve sclerosis is present, with no evidence of aortic valve stenosis. Aortic valve mean gradient measures 5.0 mmHg. Aortic valve peak gradient measures 8.0  mmHg. Aortic valve area, by VTI measures 2.96 cm. Pulmonic Valve: The pulmonic valve was normal in structure. Pulmonic valve regurgitation is trivial. No evidence of pulmonic stenosis. Aorta: The aortic root is normal in size and structure.  Venous: The inferior vena cava is normal in size with greater than 50% respiratory variability, suggesting right atrial pressure of 3 mmHg. IAS/Shunts: No atrial level shunt detected by color flow Doppler.  LEFT VENTRICLE PLAX 2D LVIDd:         4.10 cm   Diastology LVIDs:         2.25 cm   LV e' medial:    7.40 cm/s LV PW:         0.90 cm   LV E/e' medial:  8.6 LV IVS:        0.90 cm   LV e' lateral:   6.64 cm/s LVOT diam:     2.10 cm   LV E/e' lateral: 9.6 LV SV:         72 LV SV Index:   48 LVOT Area:     3.46 cm  RIGHT VENTRICLE             IVC RV Basal diam:  3.30 cm     IVC diam: 1.30 cm RV Mid diam:    2.70 cm RV S prime:     11.70 cm/s TAPSE (M-mode): 1.2 cm  LEFT ATRIUM           Index        RIGHT ATRIUM          Index LA diam:      2.00 cm 1.33 cm/m   RA Area:     8.69 cm LA Vol (A2C): 20.0 ml 13.26 ml/m  RA Volume:   16.90 ml 11.21 ml/m LA Vol (A4C): 27.7 ml 18.37 ml/m  AORTIC VALVE AV Area (Vmax):    3.02 cm AV Area (Vmean):   2.86 cm AV Area (VTI):     2.96 cm AV Vmax:           141.00 cm/s AV Vmean:          104.000 cm/s AV VTI:            0.243 m AV Peak Grad:      8.0 mmHg AV Mean Grad:      5.0 mmHg LVOT Vmax:         123.00 cm/s LVOT Vmean:        85.900 cm/s LVOT VTI:          0.208 m LVOT/AV VTI ratio: 0.86  AORTA Ao Root diam: 3.20 cm Ao Asc diam:  3.10 cm MITRAL VALVE               TRICUSPID VALVE MV Area (PHT): 3.68 cm    TR Peak grad:   25.4 mmHg MV Decel Time: 206 msec    TR Vmax:        252.00 cm/s MV E velocity: 63.70 cm/s MV A velocity: 71.00 cm/s  SHUNTS MV E/A ratio:  0.90        Systemic VTI:  0.21 m                            Systemic Diam: 2.10 cm Kirk Ruths MD Electronically signed by Kirk Ruths MD Signature Date/Time: 05/16/2021/1:52:04 PM    Final    VAS [place] LOWER EXTREMITY VENOUS (DVT)  Result Date: 05/17/2021  Lower Venous DVT Study Patient Name:  Courtney Mayer  Date of Exam:   05/17/2021 Medical Rec #: [id number]        Accession #:    [id number] Date of  Birth: 17-Dec-1970       Patient Gender: F Patient Age:   31 years Exam [facility]  [facility] Procedure:      VAS [place] LOWER EXTREMITY VENOUS (DVT) Referring Phys: PRAMOD SETHI --------------------------------------------------------------------------------  Indications: Stroke.  Comparison Study: No previous exams Performing Technologist: Jody Hill RVT, RDMS  Examination Guidelines: A complete evaluation includes B-mode imaging, spectral Doppler, color Doppler, and power Doppler as needed of all accessible portions of each vessel. Bilateral testing is considered an integral part of a complete examination. Limited examinations for reoccurring indications may be performed as noted. The reflux portion of the exam is performed with the patient in reverse Trendelenburg.  +---------+---------------+---------+-----------+----------+--------------+  RIGHT     Compressibility Phasicity Spontaneity Properties Thrombus Aging  +---------+---------------+---------+-----------+----------+--------------+  CFV       Full            Yes       Yes                                    +---------+---------------+---------+-----------+----------+--------------+  SFJ  Full                                                             +---------+---------------+---------+-----------+----------+--------------+  FV Prox   Full            Yes       Yes                                    +---------+---------------+---------+-----------+----------+--------------+  FV Mid    Full            Yes       Yes                                    +---------+---------------+---------+-----------+----------+--------------+  FV Distal Full            Yes       Yes                                    +---------+---------------+---------+-----------+----------+--------------+  PFV       Full                                                             +---------+---------------+---------+-----------+----------+--------------+  POP       Full             Yes       Yes                                    +---------+---------------+---------+-----------+----------+--------------+  PTV       Full                                                             +---------+---------------+---------+-----------+----------+--------------+  PERO      Full                                                             +---------+---------------+---------+-----------+----------+--------------+   +---------+---------------+---------+-----------+----------+--------------+  LEFT      Compressibility Phasicity Spontaneity Properties Thrombus Aging  +---------+---------------+---------+-----------+----------+--------------+  CFV       Full            Yes       Yes                                    +---------+---------------+---------+-----------+----------+--------------+  SFJ       Full                                                             +---------+---------------+---------+-----------+----------+--------------+  FV Prox   Full            Yes       Yes                                    +---------+---------------+---------+-----------+----------+--------------+  FV Mid    Full            Yes       Yes                                    +---------+---------------+---------+-----------+----------+--------------+  FV Distal Full            Yes       Yes                                    +---------+---------------+---------+-----------+----------+--------------+  PFV       Full                                                             +---------+---------------+---------+-----------+----------+--------------+  POP       Full            Yes       Yes                                    +---------+---------------+---------+-----------+----------+--------------+  PTV       Full                                                             +---------+---------------+---------+-----------+----------+--------------+  PERO      Full                                                              +---------+---------------+---------+-----------+----------+--------------+    Summary: BILATERAL: - No evidence of deep vein thrombosis seen in the lower extremities, bilaterally. - No evidence of superficial venous thrombosis in the lower extremities, bilaterally. -No evidence of popliteal cyst, bilaterally.   *See table(s) above for measurements and observations.    Preliminary        Today   Subjective    Courtney Mayer today has no headache,no chest abdominal pain,no new weakness tingling  or numbness, feels much better wants to go home today.     Objective   Blood pressure 138/81, pulse 83, temperature 98.5 F (36.9 C), temperature source Oral, resp. rate 18, height 5\' 5"  (1.651 m), weight 49.3 kg, SpO2 91 %.   Intake/Output Summary (Last 24 hours) at 05/17/2021 1651 Last data filed at 05/17/2021 0000 Gross per 24 hour  Intake 240 ml  Output --  Net 240 ml    Exam  Awake Alert, No new F.N deficits,    [place].AT,PERRAL Supple Neck,   Symmetrical Chest wall movement, Good air movement bilaterally, mild wheezing RRR,No Gallops,   +ve B.Sounds, Abd Soft, Non tender,  No Cyanosis, Clubbing or edema    Data Review   CBC w Diff:  Lab Results  Component Value Date   WBC 16.2 (H) 05/17/2021   HGB 11.4 (L) 05/17/2021   HCT 35.3 (L) 05/17/2021   PLT 489 (H) 05/17/2021   LYMPHOPCT 23 05/17/2021   MONOPCT 4 05/17/2021   EOSPCT 0 05/17/2021   BASOPCT 0 05/17/2021    CMP:  Lab Results  Component Value Date   NA 141 05/17/2021   K 3.5 05/17/2021   CL 106 05/17/2021   CO2 24 05/17/2021   BUN 17 05/17/2021   CREATININE 0.76 05/17/2021   PROT 6.0 (L) 05/17/2021   ALBUMIN 2.5 (L) 05/17/2021   BILITOT 0.2 (L) 05/17/2021   ALKPHOS 106 05/17/2021   AST 29 05/17/2021   ALT 40 05/17/2021   Lab Results  Component Value Date   CHOL 161 05/16/2021   HDL 34 (L) 05/16/2021   LDLCALC 110 (H) 05/16/2021   TRIG 87 05/16/2021   CHOLHDL 4.7 05/16/2021    Lab Results   Component Value Date   HGBA1C 6.0 (H) 05/16/2021      Total Time in preparing paper work, data evaluation and todays exam - 50 minutes  Lala Lund M.D on 05/17/2021 at 4:51 PM  [facility]

## 2021-05-17 NOTE — Progress Notes (Signed)
Occupational Therapy Treatment Patient Details Name: Courtney Mayer MRN: [id number] DOB: [birth year] Today's Date: 05/17/2021   History of present illness 51 y.o. female comes to the emergency department 05/15/21 because of shortness of breath and right upper extremity weakness. 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 Pt reports that coordination in R hand and word finding has improved. Reviewed [place] exercises from handout with pt. Pt able to type on cellphone without any diffuclty finding words 4/5 times/sentences. Practiced handwriting pt's name multiple times and instructed pt to pracice handwriting at home and to also write any specific letters that has difficulty with. Pt verbalizes and demonstrates understanding. Pt eager to d/c home later today   Recommendations for follow up therapy are one component of a multi-disciplinary discharge planning process, led by the attending physician.  Recommendations may be updated based on patient status, additional functional criteria and insurance authorization.    Follow Up Recommendations  No OT follow up    Assistance Recommended at Discharge None  Patient can return home with the following      Equipment Recommendations  None recommended by OT    Recommendations for Other Services      Precautions / Restrictions Precautions Precautions: None Restrictions Weight Bearing Restrictions: No       Mobility Bed Mobility Overal bed mobility: Independent                  Transfers Overall transfer level: Independent                       Balance Overall balance assessment: No apparent balance deficits (not formally assessed)                                         ADL either performed or  assessed with clinical judgement   ADL Overall ADL's : Independent;At baseline                                            Extremity/Trunk Assessment Upper Extremity Assessment Upper Extremity Assessment: RUE deficits/detail RUE Sensation: decreased light touch RUE Coordination: decreased fine motor       Cervical / Trunk Assessment Cervical / Trunk Assessment: Normal    Vision Baseline Vision/History: 0 No visual deficits Ability to See in Adequate Light: 0 Adequate Patient Visual Report: No change from baseline     Perception     Praxis      Cognition Arousal/Alertness: Awake/alert Behavior During Therapy: WFL for tasks assessed/performed Overall Cognitive Status: Within Functional Limits for tasks assessed                                          Exercises Other Exercises Other Exercises: reviewed [facility] exercises from handout with pt. Pt able to type on cellphone without any diffuclty finding words 4/5 times/sentences. Practiced handwriting pt's name multiple times and instructed pt to pracice handwriting at home and to also write any specific  letters that has difficulty with. Pt verbalizes and demonstrates understanding    Shoulder Instructions       General Comments      Pertinent Vitals/ Pain       Pain Assessment Pain Assessment: No/denies pain Breathing: normal Negative Vocalization: none Facial Expression: smiling or inexpressive Body Language: relaxed Consolability: no need to console PAINAD Score: 0  Home Living                                          Prior Functioning/Environment              Frequency  Min 2X/week        Progress Toward Goals  OT Goals(current goals can now be found in the care plan section)  Progress towards OT goals: Progressing toward goals     Plan Discharge plan remains appropriate    Co-evaluation                 AM-PAC OT "6 Clicks" Daily  Activity     Outcome Measure   Help from another person eating meals?: None Help from another person taking care of personal grooming?: None Help from another person toileting, which includes using toliet, bedpan, or urinal?: None Help from another person bathing (including washing, rinsing, drying)?: None Help from another person to put on and taking off regular upper body clothing?: None Help from another person to put on and taking off regular lower body clothing?: None 6 Click Score: 24    End of Session    OT Visit Diagnosis: Other abnormalities of gait and mobility (R26.89);Other (comment) (lack of coordination)   Activity Tolerance Patient tolerated treatment well   Patient Left in bed   Nurse Communication          Time: 5009-3818 OT Time Calculation (min): 16 min  Charges: OT General Charges $OT Visit: 1 Visit OT Treatments $Therapeutic Activity: 8-22 mins $Neuromuscular Re-education: 8-22 mins    Britt Bottom 05/17/2021, 3:18 PM

## 2021-05-17 NOTE — Progress Notes (Signed)
BLE venous duplex and TCD bubble study have been completed.  Results can be found under chart review under CV PROC. 05/17/2021 4:02 PM Jadea Shiffer RVT, RDMS

## 2021-05-20 LAB — CULTURE, BLOOD (ROUTINE X 2)
Culture: NO GROWTH
Special Requests: ADEQUATE

## 2021-05-28 ENCOUNTER — Telehealth: Payer: Self-pay | Admitting: Student

## 2021-05-28 NOTE — Telephone Encounter (Signed)
Spoke with pt and she asked if she is having the TEE on Thursday. Advised pt that she is coming to see Jonni Sanger to get set up for the procedures.  Pt states she doesn't need a consult, she just needs the TEE.  Explained to pt that she has to be seen in our office before we can set up any kind of procedures.  Pt replied "ok, whatever".  Advised pt to keep appt on Thursday.  Pt hung up.

## 2021-05-28 NOTE — Telephone Encounter (Signed)
Patient calling with questions about her TEE.

## 2021-05-29 NOTE — H&P (View-Only) (Signed)
ELECTROPHYSIOLOGY CONSULT NOTE  Patient ID: Letrice Pollok MRN: [id number], DOB/AGE: October 13, 1970   Date of Consult: 05/30/2021  Primary Physician: Gildardo Cranker, MD Primary Cardiologist: None  Primary Electrophysiologist: New  Reason for Consultation: Cryptogenic stroke; recommendations regarding Implantable Loop Recorder Insurance: [employer]  History of Present Illness EP has been asked to evaluate Lennox Grumbles for placement of an implantable loop recorder to monitor for atrial fibrillation by Dr Leonie Man.  The patient was admitted from 1/17 - 05/17/21 with right arm weakness and PNA.    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    She has undergone workup for stroke:  Code Stroke CT head No acute abnormality.  CTA head & neck done 1/18 shows Negative CTA for large vessel  Occlusion. 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 Marland Kitchen Bilateral MCA bifurcation aneurysms, measuring up to 4 mm on the left and 3 mm on the right. 4. Patchy multifocal opacities within the partially visualized left lung, suspicious for infection/pneumonia. 5. Emphysema  MRI  Head w wo contrast done on 1/18 shows Scattered small acute infarcts in the left frontal and parietal lobes involving the corona radiata, parietal lobe cortex and pre and postcentral gyri in the MCA distribution. EKG shows sinus tachycardia.     2D Echo  done on 05/16/2021 with ejection fraction of 70-75% with no regional wall motion abnormalities.No bubble study. Scheduled for outpatient TEE  with bubble study and Loop implant consultation since this is an embolic stroke. LDL 110 HgbA1c 6.0 VTE prophylaxis - Heparin 5000units SQ every 8 hours  Bilateral Lower extremity doppler is ordered to evaluate for DVT No skilled needs per PT/OT evaluations   The patient has been monitored on telemetry which has demonstrated sinus rhythm with no reported arrhythmias.   Inpatient stroke work-up will require a TEE per Neurology.   Echocardiogram as above. Lab work is reviewed.  Prior to admission, the patient denies chest pain, shortness of breath, dizziness, or syncope.  She does not currently have residuals from her stroke.  She has occasional palpitations, described as "flipping fluttering", but not very often.   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   COPD (chronic obstructive pulmonary disease) ([place])      Surgical History:  Past Surgical History:  Procedure Laterality Date   CESAREAN SECTION     LOBECTOMY     LUNG SURGERY Left    WRIST FRACTURE SURGERY       (Not in a hospital admission)   Inpatient Medications:   Allergies:  Allergies  Allergen Reactions   Tramadol Palpitations    Can't take Ultram ER - palpitations  Regular Tramadol is fine to take Can't take Ultram ER - palpitations  Regular Tramadol is fine to take    Amoxicillin Diarrhea   Compazine [Prochlorperazine] Other (See Comments)    Excitability   Compazine [Prochlorperazine] Anxiety    Social History   Socioeconomic History   Marital status: Single    Spouse name: Not on file   Number of children: Not on file   Years of education: Not on file   Highest education level: Not on file  Occupational History   Not on file  Tobacco Use   Smoking status: Every Day    Packs/day: 0.50    Types: Cigarettes   Smokeless tobacco: Never  Vaping Use   Vaping Use:  Every day   Substances: Nicotine  Substance and Sexual Activity   Alcohol use: Not Currently   Drug use: Never   Sexual activity: Not on file  Other Topics Concern   Not on file  Social History Narrative   ** Merged History Encounter **       Social Determinants of Health   Financial Resource Strain: Not on file  Food Insecurity: Not on file  Transportation Needs: Not on file  Physical Activity: Not on file  Stress: Not on file  Social Connections: Not on  file  Intimate Partner Violence: Not on file     Family History  Problem Relation Age of Onset   Healthy Mother    Healthy Father       Review of Systems: All other systems reviewed and are otherwise negative except as noted above.  Physical Exam: Vitals:   05/30/21 1024  BP: 116/66  Pulse: (!) 101  SpO2: 93%  Weight: 111 lb 3.2 oz (50.4 kg)  Height: 5\' 5"  (1.651 m)    GEN- The patient is well appearing, alert and oriented x 3 today.   Head- normocephalic, atraumatic Eyes-  Sclera clear, conjunctiva pink Ears- hearing intact Oropharynx- clear Neck- supple Lungs- Clear to ausculation bilaterally, normal work of breathing Heart- Regular rate and rhythm, no murmurs, rubs or gallops  GI- soft, NT, ND, + BS Extremities- no clubbing, cyanosis, or edema MS- no significant deformity or atrophy Skin- no rash or lesion Psych- euthymic mood, full affect Neuro- Moves all 4 without difficulty.    Labs:   Lab Results  Component Value Date   WBC 16.2 (H) 05/17/2021   HGB 11.4 (L) 05/17/2021   HCT 35.3 (L) 05/17/2021   MCV 97.5 05/17/2021   PLT 489 (H) 05/17/2021   No results for input(s): NA, K, CL, CO2, BUN, CREATININE, CALCIUM, PROT, BILITOT, ALKPHOS, ALT, AST, GLUCOSE in the last 168 hours.  Invalid input(s): LABALBU   Radiology/Studies: CT ANGIO HEAD NECK W WO CM  Result Date: 05/15/2021 CLINICAL DATA:  Initial evaluation for neuro deficit, stroke. EXAM: CT ANGIOGRAPHY HEAD AND NECK TECHNIQUE: Multidetector CT imaging of the head and neck was performed using the standard protocol during bolus administration of intravenous contrast. Multiplanar CT image reconstructions and MIPs were obtained to evaluate the vascular anatomy. Carotid stenosis measurements (when applicable) are obtained utilizing NASCET criteria, using the distal internal carotid diameter as the denominator. RADIATION DOSE REDUCTION: This exam was performed according to the departmental dose-optimization  program which includes automated exposure control, adjustment of the mA and/or kV according to patient size and/or use of iterative reconstruction technique. CONTRAST:  25mL OMNIPAQUE IOHEXOL 350 MG/ML SOLN COMPARISON:  Prior study from 01/15/2021. FINDINGS: CT HEAD FINDINGS Brain: Examination degraded by motion artifact. Cerebral volume within normal limits. No acute intracranial hemorrhage. No visible acute large vessel territory infarct. No mass lesion, midline shift or mass effect. No hydrocephalus or extra-axial fluid collection. Vascular: No visible hyperdense vessel. Skull: Scalp soft tissues demonstrate no acute finding. Calvarium grossly intact. Sinuses: Paranasal sinuses and mastoid air cells are grossly clear. Orbits: No obvious abnormality about the globes orbital soft tissues. Review of the MIP images confirms the above findings CTA NECK FINDINGS Aortic arch: A shin examination degraded by motion. Visualized aortic arch normal caliber with normal branch pattern. Mild plaque within the arch itself. No stenosis about the origin the great vessels. Right carotid system: Right common and internal carotid arteries patent without stenosis or dissection. Left carotid  system: Left CCA patent from its origin to the bifurcation without stenosis. Eccentric soft plaque at the left carotid bulb/proximal left ICA with associated stenosis of up to approximately 50% by NASCET criteria. Left ICA patent distally without stenosis or dissection. Vertebral arteries: Both vertebral arteries arise from subclavian arteries. No proximal subclavian artery stenosis. Right vertebral artery dominant. Vertebral arteries patent without stenosis or dissection. Skeleton: No discrete or worrisome osseous lesions. Mild chronic height loss noted at the superior endplates of T2 through T4. Other neck: No other acute soft tissue abnormality within the neck. Upper chest: Scattered patchy multifocal opacities seen within the partially  visualized left lung, suspicious for possible infection/pneumonia. Underlying centrilobular emphysema. Review of the MIP images confirms the above findings CTA HEAD FINDINGS Anterior circulation: Both internal carotid arteries patent to the termini without stenosis. A1 segments patent bilaterally. Normal anterior communicating artery complex. Anterior cerebral arteries widely patent bilaterally. No M1 stenosis or occlusion. 4 mm saccular aneurysm seen at the left MCA bifurcation (series 11, image 102). 3 mm saccular aneurysm present at the contralateral right MCA bifurcation (series 11, image 104). Distal MCA branches well perfused and symmetric. Additional Posterior circulation: Both V4 segments patent to the vertebrobasilar junction without stenosis. Both PICA origins patent and normal. Basilar patent to its distal aspect without stenosis. Superior cerebellar and posterior cerebral arteries widely patent bilaterally. Venous sinuses: Patent allowing for timing of contrast bolus. Anatomic variants: None significant. Review of the MIP images confirms the above findings IMPRESSION: CT HEAD IMPRESSION: 1. Motion degraded exam. 2. No acute intracranial abnormality. CTA HEAD AND NECK IMPRESSION: 1. Negative CTA for large vessel occlusion. 2. 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 3. 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 4. Patchy multifocal opacities within the partially visualized left lung, suspicious for infection/pneumonia. 5. Emphysema (ICD10-J43.9). Electronically Signed   By: Jeannine Boga M.D.   On: 05/15/2021 04:39   MR Brain W and Wo Contrast  Result Date: 05/15/2021 CLINICAL DATA:  Right arm weakness since 10/30 last night EXAM: MRI HEAD WITHOUT AND WITH CONTRAST TECHNIQUE: Multiplanar, multiecho pulse sequences of the brain and surrounding structures were obtained without and with intravenous contrast. CONTRAST:  68mL GADAVIST  GADOBUTROL 1 MMOL/ML IV SOLN COMPARISON:  Same-day CT/CTA head and neck FINDINGS: Brain: There are multiple small infarcts in the left frontal and parietal lobes involving the corona radiata, parietal lobe cortex, and pre and postcentral gyri. There is no associated hemorrhage or mass effect. There is no acute intracranial hemorrhage or extra-axial fluid collection. Background parenchymal volume is normal. The ventricles are normal in size. Parenchymal signal is otherwise normal. There is no mass lesion or abnormal enhancement. There is no midline shift. Vascular: Normal flow voids. Skull and upper cervical spine: Normal marrow signal. Sinuses/Orbits: The paranasal sinuses are clear. The globes and orbits are unremarkable. Other: None. 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 Electronically Signed   By: Valetta Mole M.D.   On: 05/15/2021 08:12   DG Chest Port 1 View  Result Date: 05/15/2021 CLINICAL DATA:  Possible sepsis.  Right arm pain. EXAM: PORTABLE CHEST 1 VIEW COMPARISON:  01/15/2021. FINDINGS: The heart size and mediastinal contours are stable. Apical pleural scarring and postsurgical changes are noted in the left upper lobe. There is elevation of  the left diaphragm with patchy interstitial and airspace opacities in the mid to lower left lung field. The right lung is clear. No effusion or pneumothorax. No acute osseous abnormality. IMPRESSION: 1. Interstitial and airspace opacities in the mid to lower left lung field, possible developing pneumonia. 2. Stable postsurgical changes and apical pleural thickening on the left. Electronically Signed   By: Brett Fairy M.D.   On: 05/15/2021 00:22   VAS [place] TRANSCRANIAL DOPPLER W BUBBLES  Result Date: 05/23/2021  Transcranial Doppler with Bubble Patient Name:  NAKIMA FLUEGGE  Date of Exam:   05/17/2021 Medical Rec  #: [id number]        Accession #:    [id number] Date of Birth: [birth year]       Patient Gender: F Patient Age:   65 years Exam Location:  [facility] Procedure:      VAS [place] TRANSCRANIAL DOPPLER W BUBBLES Referring Phys: PRAMOD SETHI --------------------------------------------------------------------------------  Indications: Stroke. Performing Technologist: Rogelia Rohrer RVT, RDMS  Examination Guidelines: A complete evaluation includes B-mode imaging, spectral Doppler, color Doppler, and power Doppler as needed of all accessible portions of each vessel. Bilateral testing is considered an integral part of a complete examination. Limited examinations for reoccurring indications may be performed as noted.  Summary: No HITS at rest or during Valsalva. Negative transcranial Doppler Bubble study with no evidence of right to left intracardiac communication.  A vascular evaluation was performed. The right middle cerebral artery was studied. An IV was inserted into the patient's left AC. Verbal informed consent was obtained.  NEGATIVE TCD Bubble study with no evidence of right to left shunt *See table(s) above for TCD measurements and observations.  Diagnosing physician: Antony Contras MD Electronically signed by Antony Contras MD on 05/23/2021 at 10:46:12 AM.    Final    ECHOCARDIOGRAM COMPLETE  Result Date: 05/16/2021    ECHOCARDIOGRAM REPORT   Patient Name:   MONET NORTH Date of Exam: 05/16/2021 Medical Rec #:  [id number]       Height:       65.0 in Accession #:    [id number]      Weight:       105.6 lb Date of Birth:  12-Sep-1970      BSA:          1.508 m Patient Age:    36 years        BP:           121/89 mmHg Patient Gender: F               HR:           100 bpm. Exam Location:  Inpatient Procedure: 2D Echo Indications:    Stroke  History:        Patient has no prior history of Echocardiogram examinations.                 COPD.  Sonographer:    Arlyss Gandy Referring Phys: Arnell Asal Margaree Mackintosh [place]  1.  Left ventricular ejection fraction, by estimation, is 70 to 75%. The left ventricle has hyperdynamic function. The left ventricle has no regional wall motion abnormalities. Left ventricular diastolic parameters are consistent with Grade I diastolic dysfunction (impaired relaxation).  2. Right ventricular systolic function is normal. The right ventricular size is normal. Tricuspid regurgitation signal is inadequate for assessing PA pressure.  3. The mitral valve is normal in structure. Trivial mitral valve regurgitation. No evidence of mitral stenosis.  4. The aortic valve  is tricuspid. Aortic valve regurgitation is not visualized. Aortic valve sclerosis is present, with no evidence of aortic valve stenosis.  5. The inferior vena cava is normal in size with greater than 50% respiratory variability, suggesting right atrial pressure of 3 mmHg. Comparison(s): No prior Echocardiogram. FINDINGS  Left Ventricle: Left ventricular ejection fraction, by estimation, is 70 to 75%. The left ventricle has hyperdynamic function. The left ventricle has no regional wall motion abnormalities. The left ventricular internal cavity size was normal in size. There is no left ventricular hypertrophy. Left ventricular diastolic parameters are consistent with Grade I diastolic dysfunction (impaired relaxation). Right Ventricle: The right ventricular size is normal. Right ventricular systolic function is normal. Tricuspid regurgitation signal is inadequate for assessing PA pressure. The tricuspid regurgitant velocity is 2.52 m/s, and with an assumed right atrial  pressure of 3 mmHg, the estimated right ventricular systolic pressure is 50.5 mmHg. Left Atrium: Left atrial size was normal in size. Right Atrium: Right atrial size was normal in size. Pericardium: There is no evidence of pericardial effusion. Mitral Valve: The mitral valve is normal in structure. Trivial mitral valve regurgitation. No evidence of mitral valve stenosis. Tricuspid  Valve: The tricuspid valve is normal in structure. Tricuspid valve regurgitation is trivial. No evidence of tricuspid stenosis. Aortic Valve: The aortic valve is tricuspid. Aortic valve regurgitation is not visualized. Aortic valve sclerosis is present, with no evidence of aortic valve stenosis. Aortic valve mean gradient measures 5.0 mmHg. Aortic valve peak gradient measures 8.0  mmHg. Aortic valve area, by VTI measures 2.96 cm. Pulmonic Valve: The pulmonic valve was normal in structure. Pulmonic valve regurgitation is trivial. No evidence of pulmonic stenosis. Aorta: The aortic root is normal in size and structure. Venous: The inferior vena cava is normal in size with greater than 50% respiratory variability, suggesting right atrial pressure of 3 mmHg. IAS/Shunts: No atrial level shunt detected by color flow Doppler.  LEFT VENTRICLE PLAX 2D LVIDd:         4.10 cm   Diastology LVIDs:         2.25 cm   LV e' medial:    7.40 cm/s LV PW:         0.90 cm   LV E/e' medial:  8.6 LV IVS:        0.90 cm   LV e' lateral:   6.64 cm/s LVOT diam:     2.10 cm   LV E/e' lateral: 9.6 LV SV:         72 LV SV Index:   48 LVOT Area:     3.46 cm  RIGHT VENTRICLE             IVC RV Basal diam:  3.30 cm     IVC diam: 1.30 cm RV Mid diam:    2.70 cm RV S prime:     11.70 cm/s TAPSE (M-mode): 1.2 cm LEFT ATRIUM           Index        RIGHT ATRIUM          Index LA diam:      2.00 cm 1.33 cm/m   RA Area:     8.69 cm LA Vol (A2C): 20.0 ml 13.26 ml/m  RA Volume:   16.90 ml 11.21 ml/m LA Vol (A4C): 27.7 ml 18.37 ml/m  AORTIC VALVE AV Area (Vmax):    3.02 cm AV Area (Vmean):   2.86 cm AV Area (VTI):     2.96  cm AV Vmax:           141.00 cm/s AV Vmean:          104.000 cm/s AV VTI:            0.243 m AV Peak Grad:      8.0 mmHg AV Mean Grad:      5.0 mmHg LVOT Vmax:         123.00 cm/s LVOT Vmean:        85.900 cm/s LVOT VTI:          0.208 m LVOT/AV VTI ratio: 0.86  AORTA Ao Root diam: 3.20 cm Ao Asc diam:  3.10 cm MITRAL VALVE                TRICUSPID VALVE MV Area (PHT): 3.68 cm    TR Peak grad:   25.4 mmHg MV Decel Time: 206 msec    TR Vmax:        252.00 cm/s MV E velocity: 63.70 cm/s MV A velocity: 71.00 cm/s  SHUNTS MV E/A ratio:  0.90        Systemic VTI:  0.21 m                            Systemic Diam: 2.10 cm Kirk Ruths MD Electronically signed by Kirk Ruths MD Signature Date/Time: 05/16/2021/1:52:04 PM    Final    VAS [place] LOWER EXTREMITY VENOUS (DVT)  Result Date: 05/17/2021  Lower Venous DVT Study Patient Name:  LYNORE COSCIA  Date of Exam:   05/17/2021 Medical Rec #: [id number]        Accession #:    [id number] Date of Birth: 1971-04-01       Patient Gender: F Patient Age:   50 years [facility]am [facility] Procedure:      VAS [place] LOWER EXTREMITY VENOUS (DVT) Referring Phys: PRAMOD SETHI --------------------------------------------------------------------------------  Indications: Stroke.  Comparison Study: No previous exams Performing Technologist: Jody Hill RVT, RDMS  Examination Guidelines: A complete evaluation includes B-mode imaging, spectral Doppler, color Doppler, and power Doppler as needed of all accessible portions of each vessel. Bilateral testing is considered an integral part of a complete examination. Limited examinations for reoccurring indications may be performed as noted. The reflux portion of the exam is performed with the patient in reverse Trendelenburg.  +---------+---------------+---------+-----------+----------+--------------+  RIGHT     Compressibility Phasicity Spontaneity Properties Thrombus Aging  +---------+---------------+---------+-----------+----------+--------------+  CFV       Full            Yes       Yes                                    +---------+---------------+---------+-----------+----------+--------------+  SFJ       Full                                                             +---------+---------------+---------+-----------+----------+--------------+  FV Prox    Full            Yes       Yes                                    +---------+---------------+---------+-----------+----------+--------------+  FV Mid    Full            Yes       Yes                                    +---------+---------------+---------+-----------+----------+--------------+  FV Distal Full            Yes       Yes                                    +---------+---------------+---------+-----------+----------+--------------+  PFV       Full                                                             +---------+---------------+---------+-----------+----------+--------------+  POP       Full            Yes       Yes                                    +---------+---------------+---------+-----------+----------+--------------+  PTV       Full                                                             +---------+---------------+---------+-----------+----------+--------------+  PERO      Full                                                             +---------+---------------+---------+-----------+----------+--------------+   +---------+---------------+---------+-----------+----------+--------------+  LEFT      Compressibility Phasicity Spontaneity Properties Thrombus Aging  +---------+---------------+---------+-----------+----------+--------------+  CFV       Full            Yes       Yes                                    +---------+---------------+---------+-----------+----------+--------------+  SFJ       Full                                                             +---------+---------------+---------+-----------+----------+--------------+  FV Prox   Full            Yes       Yes                                    +---------+---------------+---------+-----------+----------+--------------+  FV Mid    Full            Yes       Yes                                    +---------+---------------+---------+-----------+----------+--------------+  FV Distal Full            Yes       Yes                                     +---------+---------------+---------+-----------+----------+--------------+  PFV       Full                                                             +---------+---------------+---------+-----------+----------+--------------+  POP       Full            Yes       Yes                                    +---------+---------------+---------+-----------+----------+--------------+  PTV       Full                                                             +---------+---------------+---------+-----------+----------+--------------+  PERO      Full                                                             +---------+---------------+---------+-----------+----------+--------------+     Summary: BILATERAL: - No evidence of deep vein thrombosis seen in the lower extremities, bilaterally. - No evidence of superficial venous thrombosis in the lower extremities, bilaterally. -No evidence of popliteal cyst, bilaterally.   *See table(s) above for measurements and observations. Electronically signed by Deitra Mayo MD on 05/17/2021 at 6:19:00 PM.    Final     12-lead ECG EKG on arrival to ED shows sinus tachycardia at 114 (personally reviewed) All prior EKG's in EPIC reviewed with no documented atrial fibrillation  Telemetry CV strips available do not show AF. (personally reviewed)  Assessment and Plan:  1. Cryptogenic stroke The patient presents with cryptogenic stroke.  Neurology recommends she undergoes TEE and loop recorder. As most companies require short term monitoring prior to implantation of loop recorder, will plan to place Zio patch to bridge to loop with TEE. Loop can occur at time of TEE if unremarkable and Zio completed, or at subsequent office visit. Will discuss with MD.  I spoke at length with the patient about monitoring for afib with an implantable loop recorder.  Risks, benefits, and alteratives to implantable loop recorder were discussed with the patient today.  For  efficiencies sake, in shared decision making decision made to have monitor placed today, Have TEE as soon as possible, then have subsequent visit after both Monitor and TEE resulted to discuss and or place loop recorder.   Shirley Friar, PA-C 05/30/2021 10:36 AM

## 2021-05-29 NOTE — Progress Notes (Signed)
ELECTROPHYSIOLOGY CONSULT NOTE  Patient ID: Courtney Mayer MRN: [id number], DOB/AGE: Dec 18, 1970   Date of Consult: 05/30/2021  Primary Physician: Gildardo Cranker, MD Primary Cardiologist: None  Primary Electrophysiologist: New  Reason for Consultation: Cryptogenic stroke; recommendations regarding Implantable Loop Recorder Insurance: [employer]  History of Present Illness EP has been asked to evaluate Courtney Mayer for placement of an implantable loop recorder to monitor for atrial fibrillation by Dr Leonie Man.  The patient was admitted from 1/17 - 05/17/21 with right arm weakness and PNA.    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    She has undergone workup for stroke:  Code Stroke CT head No acute abnormality.  CTA head & neck done 1/18 shows Negative CTA for large vessel  Occlusion. 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 Marland Kitchen Bilateral MCA bifurcation aneurysms, measuring up to 4 mm on the left and 3 mm on the right. 4. Patchy multifocal opacities within the partially visualized left lung, suspicious for infection/pneumonia. 5. Emphysema  MRI  Head w wo contrast done on 1/18 shows Scattered small acute infarcts in the left frontal and parietal lobes involving the corona radiata, parietal lobe cortex and pre and postcentral gyri in the MCA distribution. EKG shows sinus tachycardia.     2D Echo  done on 05/16/2021 with ejection fraction of 70-75% with no regional wall motion abnormalities.No bubble study. Scheduled for outpatient TEE  with bubble study and Loop implant consultation since this is an embolic stroke. LDL 110 HgbA1c 6.0 VTE prophylaxis - Heparin 5000units SQ every 8 hours  Bilateral Lower extremity doppler is ordered to evaluate for DVT No skilled needs per PT/OT evaluations   The patient has been monitored on telemetry which has demonstrated sinus rhythm with no reported arrhythmias.   Inpatient stroke work-up will require a TEE per Neurology.   Echocardiogram as above. Lab work is reviewed.  Prior to admission, the patient denies chest pain, shortness of breath, dizziness, or syncope.  She does not currently have residuals from her stroke.  She has occasional palpitations, described as "flipping fluttering", but not very often.   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   COPD (chronic obstructive pulmonary disease) ([place])      Surgical History:  Past Surgical History:  Procedure Laterality Date   CESAREAN SECTION     LOBECTOMY     LUNG SURGERY Left    WRIST FRACTURE SURGERY       (Not in a hospital admission)   Inpatient Medications:   Allergies:  Allergies  Allergen Reactions   Tramadol Palpitations    Can't take Ultram ER - palpitations  Regular Tramadol is fine to take Can't take Ultram ER - palpitations  Regular Tramadol is fine to take    Amoxicillin Diarrhea   Compazine [Prochlorperazine] Other (See Comments)    Excitability   Compazine [Prochlorperazine] Anxiety    Social History   Socioeconomic History   Marital status: Single    Spouse name: Not on file   Number of children: Not on file   Years of education: Not on file   Highest education level: Not on file  Occupational History   Not on file  Tobacco Use   Smoking status: Every Day    Packs/day: 0.50    Types: Cigarettes   Smokeless tobacco: Never  Vaping Use   Vaping Use:  Every day   Substances: Nicotine  Substance and Sexual Activity   Alcohol use: Not Currently   Drug use: Never   Sexual activity: Not on file  Other Topics Concern   Not on file  Social History Narrative   ** Merged History Encounter **       Social Determinants of Health   Financial Resource Strain: Not on file  Food Insecurity: Not on file  Transportation Needs: Not on file  Physical Activity: Not on file  Stress: Not on file  Social Connections: Not on  file  Intimate Partner Violence: Not on file     Family History  Problem Relation Age of Onset   Healthy Mother    Healthy Father       Review of Systems: All other systems reviewed and are otherwise negative except as noted above.  Physical Exam: Vitals:   05/30/21 1024  BP: 116/66  Pulse: (!) 101  SpO2: 93%  Weight: 111 lb 3.2 oz (50.4 kg)  Height: 5\' 5"  (1.651 m)    GEN- The patient is well appearing, alert and oriented x 3 today.   Head- normocephalic, atraumatic Eyes-  Sclera clear, conjunctiva pink Ears- hearing intact Oropharynx- clear Neck- supple Lungs- Clear to ausculation bilaterally, normal work of breathing Heart- Regular rate and rhythm, no murmurs, rubs or gallops  GI- soft, NT, ND, + BS Extremities- no clubbing, cyanosis, or edema MS- no significant deformity or atrophy Skin- no rash or lesion Psych- euthymic mood, full affect Neuro- Moves all 4 without difficulty.    Labs:   Lab Results  Component Value Date   WBC 16.2 (H) 05/17/2021   HGB 11.4 (L) 05/17/2021   HCT 35.3 (L) 05/17/2021   MCV 97.5 05/17/2021   PLT 489 (H) 05/17/2021   No results for input(s): NA, K, CL, CO2, BUN, CREATININE, CALCIUM, PROT, BILITOT, ALKPHOS, ALT, AST, GLUCOSE in the last 168 hours.  Invalid input(s): LABALBU   Radiology/Studies: CT ANGIO HEAD NECK W WO CM  Result Date: 05/15/2021 CLINICAL DATA:  Initial evaluation for neuro deficit, stroke. EXAM: CT ANGIOGRAPHY HEAD AND NECK TECHNIQUE: Multidetector CT imaging of the head and neck was performed using the standard protocol during bolus administration of intravenous contrast. Multiplanar CT image reconstructions and MIPs were obtained to evaluate the vascular anatomy. Carotid stenosis measurements (when applicable) are obtained utilizing NASCET criteria, using the distal internal carotid diameter as the denominator. RADIATION DOSE REDUCTION: This exam was performed according to the departmental dose-optimization  program which includes automated exposure control, adjustment of the mA and/or kV according to patient size and/or use of iterative reconstruction technique. CONTRAST:  71mL OMNIPAQUE IOHEXOL 350 MG/ML SOLN COMPARISON:  Prior study from 01/15/2021. FINDINGS: CT HEAD FINDINGS Brain: Examination degraded by motion artifact. Cerebral volume within normal limits. No acute intracranial hemorrhage. No visible acute large vessel territory infarct. No mass lesion, midline shift or mass effect. No hydrocephalus or extra-axial fluid collection. Vascular: No visible hyperdense vessel. Skull: Scalp soft tissues demonstrate no acute finding. Calvarium grossly intact. Sinuses: Paranasal sinuses and mastoid air cells are grossly clear. Orbits: No obvious abnormality about the globes orbital soft tissues. Review of the MIP images confirms the above findings CTA NECK FINDINGS Aortic arch: A shin examination degraded by motion. Visualized aortic arch normal caliber with normal branch pattern. Mild plaque within the arch itself. No stenosis about the origin the great vessels. Right carotid system: Right common and internal carotid arteries patent without stenosis or dissection. Left carotid  system: Left CCA patent from its origin to the bifurcation without stenosis. Eccentric soft plaque at the left carotid bulb/proximal left ICA with associated stenosis of up to approximately 50% by NASCET criteria. Left ICA patent distally without stenosis or dissection. Vertebral arteries: Both vertebral arteries arise from subclavian arteries. No proximal subclavian artery stenosis. Right vertebral artery dominant. Vertebral arteries patent without stenosis or dissection. Skeleton: No discrete or worrisome osseous lesions. Mild chronic height loss noted at the superior endplates of T2 through T4. Other neck: No other acute soft tissue abnormality within the neck. Upper chest: Scattered patchy multifocal opacities seen within the partially  visualized left lung, suspicious for possible infection/pneumonia. Underlying centrilobular emphysema. Review of the MIP images confirms the above findings CTA HEAD FINDINGS Anterior circulation: Both internal carotid arteries patent to the termini without stenosis. A1 segments patent bilaterally. Normal anterior communicating artery complex. Anterior cerebral arteries widely patent bilaterally. No M1 stenosis or occlusion. 4 mm saccular aneurysm seen at the left MCA bifurcation (series 11, image 102). 3 mm saccular aneurysm present at the contralateral right MCA bifurcation (series 11, image 104). Distal MCA branches well perfused and symmetric. Additional Posterior circulation: Both V4 segments patent to the vertebrobasilar junction without stenosis. Both PICA origins patent and normal. Basilar patent to its distal aspect without stenosis. Superior cerebellar and posterior cerebral arteries widely patent bilaterally. Venous sinuses: Patent allowing for timing of contrast bolus. Anatomic variants: None significant. Review of the MIP images confirms the above findings IMPRESSION: CT HEAD IMPRESSION: 1. Motion degraded exam. 2. No acute intracranial abnormality. CTA HEAD AND NECK IMPRESSION: 1. Negative CTA for large vessel occlusion. 2. 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 3. 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 4. Patchy multifocal opacities within the partially visualized left lung, suspicious for infection/pneumonia. 5. Emphysema (ICD10-J43.9). Electronically Signed   By: Jeannine Boga M.D.   On: 05/15/2021 04:39   MR Brain W and Wo Contrast  Result Date: 05/15/2021 CLINICAL DATA:  Right arm weakness since 10/30 last night EXAM: MRI HEAD WITHOUT AND WITH CONTRAST TECHNIQUE: Multiplanar, multiecho pulse sequences of the brain and surrounding structures were obtained without and with intravenous contrast. CONTRAST:  69mL GADAVIST  GADOBUTROL 1 MMOL/ML IV SOLN COMPARISON:  Same-day CT/CTA head and neck FINDINGS: Brain: There are multiple small infarcts in the left frontal and parietal lobes involving the corona radiata, parietal lobe cortex, and pre and postcentral gyri. There is no associated hemorrhage or mass effect. There is no acute intracranial hemorrhage or extra-axial fluid collection. Background parenchymal volume is normal. The ventricles are normal in size. Parenchymal signal is otherwise normal. There is no mass lesion or abnormal enhancement. There is no midline shift. Vascular: Normal flow voids. Skull and upper cervical spine: Normal marrow signal. Sinuses/Orbits: The paranasal sinuses are clear. The globes and orbits are unremarkable. Other: None. 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 Electronically Signed   By: Valetta Mole M.D.   On: 05/15/2021 08:12   DG Chest Port 1 View  Result Date: 05/15/2021 CLINICAL DATA:  Possible sepsis.  Right arm pain. EXAM: PORTABLE CHEST 1 VIEW COMPARISON:  01/15/2021. FINDINGS: The heart size and mediastinal contours are stable. Apical pleural scarring and postsurgical changes are noted in the left upper lobe. There is elevation of  the left diaphragm with patchy interstitial and airspace opacities in the mid to lower left lung field. The right lung is clear. No effusion or pneumothorax. No acute osseous abnormality. IMPRESSION: 1. Interstitial and airspace opacities in the mid to lower left lung field, possible developing pneumonia. 2. Stable postsurgical changes and apical pleural thickening on the left. Electronically Signed   By: Brett Fairy M.D.   On: 05/15/2021 00:22   VAS [place] TRANSCRANIAL DOPPLER W BUBBLES  Result Date: 05/23/2021  Transcranial Doppler with Bubble Patient Name:  GEORGANA ROMAIN  Date of Exam:   05/17/2021 Medical Rec  #: [id number]        Accession #:    [id number] Date of Birth: May 06, 1970       Patient Gender: F Patient Age:   50 years Exam Location:  [facility] Procedure:      VAS [place] TRANSCRANIAL DOPPLER W BUBBLES Referring Phys: PRAMOD SETHI --------------------------------------------------------------------------------  Indications: Stroke. Performing Technologist: Rogelia Rohrer RVT, RDMS  Examination Guidelines: A complete evaluation includes B-mode imaging, spectral Doppler, color Doppler, and power Doppler as needed of all accessible portions of each vessel. Bilateral testing is considered an integral part of a complete examination. Limited examinations for reoccurring indications may be performed as noted.  Summary: No HITS at rest or during Valsalva. Negative transcranial Doppler Bubble study with no evidence of right to left intracardiac communication.  A vascular evaluation was performed. The right middle cerebral artery was studied. An IV was inserted into the patient's left AC. Verbal informed consent was obtained.  NEGATIVE TCD Bubble study with no evidence of right to left shunt *See table(s) above for TCD measurements and observations.  Diagnosing physician: Antony Contras MD Electronically signed by Antony Contras MD on 05/23/2021 at 10:46:12 AM.    Final    ECHOCARDIOGRAM COMPLETE  Result Date: 05/16/2021    ECHOCARDIOGRAM REPORT   Patient Name:   ARENA LINDAHL Date of Exam: 05/16/2021 Medical Rec #:  [id number]       Height:       65.0 in Accession #:    [id number]      Weight:       105.6 lb Date of Birth:  Mar 13, 1971      BSA:          1.508 m Patient Age:    2 years        BP:           121/89 mmHg Patient Gender: F               HR:           100 bpm. Exam Location:  Inpatient Procedure: 2D Echo Indications:    Stroke  History:        Patient has no prior history of Echocardiogram examinations.                 COPD.  Sonographer:    Arlyss Gandy Referring Phys: Arnell Asal Margaree Mackintosh [place]  1.  Left ventricular ejection fraction, by estimation, is 70 to 75%. The left ventricle has hyperdynamic function. The left ventricle has no regional wall motion abnormalities. Left ventricular diastolic parameters are consistent with Grade I diastolic dysfunction (impaired relaxation).  2. Right ventricular systolic function is normal. The right ventricular size is normal. Tricuspid regurgitation signal is inadequate for assessing PA pressure.  3. The mitral valve is normal in structure. Trivial mitral valve regurgitation. No evidence of mitral stenosis.  4. The aortic valve  is tricuspid. Aortic valve regurgitation is not visualized. Aortic valve sclerosis is present, with no evidence of aortic valve stenosis.  5. The inferior vena cava is normal in size with greater than 50% respiratory variability, suggesting right atrial pressure of 3 mmHg. Comparison(s): No prior Echocardiogram. FINDINGS  Left Ventricle: Left ventricular ejection fraction, by estimation, is 70 to 75%. The left ventricle has hyperdynamic function. The left ventricle has no regional wall motion abnormalities. The left ventricular internal cavity size was normal in size. There is no left ventricular hypertrophy. Left ventricular diastolic parameters are consistent with Grade I diastolic dysfunction (impaired relaxation). Right Ventricle: The right ventricular size is normal. Right ventricular systolic function is normal. Tricuspid regurgitation signal is inadequate for assessing PA pressure. The tricuspid regurgitant velocity is 2.52 m/s, and with an assumed right atrial  pressure of 3 mmHg, the estimated right ventricular systolic pressure is 16.1 mmHg. Left Atrium: Left atrial size was normal in size. Right Atrium: Right atrial size was normal in size. Pericardium: There is no evidence of pericardial effusion. Mitral Valve: The mitral valve is normal in structure. Trivial mitral valve regurgitation. No evidence of mitral valve stenosis. Tricuspid  Valve: The tricuspid valve is normal in structure. Tricuspid valve regurgitation is trivial. No evidence of tricuspid stenosis. Aortic Valve: The aortic valve is tricuspid. Aortic valve regurgitation is not visualized. Aortic valve sclerosis is present, with no evidence of aortic valve stenosis. Aortic valve mean gradient measures 5.0 mmHg. Aortic valve peak gradient measures 8.0  mmHg. Aortic valve area, by VTI measures 2.96 cm. Pulmonic Valve: The pulmonic valve was normal in structure. Pulmonic valve regurgitation is trivial. No evidence of pulmonic stenosis. Aorta: The aortic root is normal in size and structure. Venous: The inferior vena cava is normal in size with greater than 50% respiratory variability, suggesting right atrial pressure of 3 mmHg. IAS/Shunts: No atrial level shunt detected by color flow Doppler.  LEFT VENTRICLE PLAX 2D LVIDd:         4.10 cm   Diastology LVIDs:         2.25 cm   LV e' medial:    7.40 cm/s LV PW:         0.90 cm   LV E/e' medial:  8.6 LV IVS:        0.90 cm   LV e' lateral:   6.64 cm/s LVOT diam:     2.10 cm   LV E/e' lateral: 9.6 LV SV:         72 LV SV Index:   48 LVOT Area:     3.46 cm  RIGHT VENTRICLE             IVC RV Basal diam:  3.30 cm     IVC diam: 1.30 cm RV Mid diam:    2.70 cm RV S prime:     11.70 cm/s TAPSE (M-mode): 1.2 cm LEFT ATRIUM           Index        RIGHT ATRIUM          Index LA diam:      2.00 cm 1.33 cm/m   RA Area:     8.69 cm LA Vol (A2C): 20.0 ml 13.26 ml/m  RA Volume:   16.90 ml 11.21 ml/m LA Vol (A4C): 27.7 ml 18.37 ml/m  AORTIC VALVE AV Area (Vmax):    3.02 cm AV Area (Vmean):   2.86 cm AV Area (VTI):     2.96  cm AV Vmax:           141.00 cm/s AV Vmean:          104.000 cm/s AV VTI:            0.243 m AV Peak Grad:      8.0 mmHg AV Mean Grad:      5.0 mmHg LVOT Vmax:         123.00 cm/s LVOT Vmean:        85.900 cm/s LVOT VTI:          0.208 m LVOT/AV VTI ratio: 0.86  AORTA Ao Root diam: 3.20 cm Ao Asc diam:  3.10 cm MITRAL VALVE                TRICUSPID VALVE MV Area (PHT): 3.68 cm    TR Peak grad:   25.4 mmHg MV Decel Time: 206 msec    TR Vmax:        252.00 cm/s MV E velocity: 63.70 cm/s MV A velocity: 71.00 cm/s  SHUNTS MV E/A ratio:  0.90        Systemic VTI:  0.21 m                            Systemic Diam: 2.10 cm Kirk Ruths MD Electronically signed by Kirk Ruths MD Signature Date/Time: 05/16/2021/1:52:04 PM    Final    VAS [place] LOWER EXTREMITY VENOUS (DVT)  Result Date: 05/17/2021  Lower Venous DVT Study Patient Name:  ANIELLA WANDREY  Date of Exam:   05/17/2021 Medical Rec #: [id number]        Accession #:    [id number] Date of Birth: [birth year]       Patient Gender: F Patient Age:   66 years [facility] Procedure:      VAS [place] LOWER EXTREMITY VENOUS (DVT) Referring Phys: PRAMOD SETHI --------------------------------------------------------------------------------  Indications: Stroke.  Comparison Study: No previous exams Performing Technologist: Jody Hill RVT, RDMS  Examination Guidelines: A complete evaluation includes B-mode imaging, spectral Doppler, color Doppler, and power Doppler as needed of all accessible portions of each vessel. Bilateral testing is considered an integral part of a complete examination. Limited examinations for reoccurring indications may be performed as noted. The reflux portion of the exam is performed with the patient in reverse Trendelenburg.  +---------+---------------+---------+-----------+----------+--------------+  RIGHT     Compressibility Phasicity Spontaneity Properties Thrombus Aging  +---------+---------------+---------+-----------+----------+--------------+  CFV       Full            Yes       Yes                                    +---------+---------------+---------+-----------+----------+--------------+  SFJ       Full                                                             +---------+---------------+---------+-----------+----------+--------------+  FV Prox    Full            Yes       Yes                                    +---------+---------------+---------+-----------+----------+--------------+  FV Mid    Full            Yes       Yes                                    +---------+---------------+---------+-----------+----------+--------------+  FV Distal Full            Yes       Yes                                    +---------+---------------+---------+-----------+----------+--------------+  PFV       Full                                                             +---------+---------------+---------+-----------+----------+--------------+  POP       Full            Yes       Yes                                    +---------+---------------+---------+-----------+----------+--------------+  PTV       Full                                                             +---------+---------------+---------+-----------+----------+--------------+  PERO      Full                                                             +---------+---------------+---------+-----------+----------+--------------+   +---------+---------------+---------+-----------+----------+--------------+  LEFT      Compressibility Phasicity Spontaneity Properties Thrombus Aging  +---------+---------------+---------+-----------+----------+--------------+  CFV       Full            Yes       Yes                                    +---------+---------------+---------+-----------+----------+--------------+  SFJ       Full                                                             +---------+---------------+---------+-----------+----------+--------------+  FV Prox   Full            Yes       Yes                                    +---------+---------------+---------+-----------+----------+--------------+  FV Mid    Full            Yes       Yes                                    +---------+---------------+---------+-----------+----------+--------------+  FV Distal Full            Yes       Yes                                     +---------+---------------+---------+-----------+----------+--------------+  PFV       Full                                                             +---------+---------------+---------+-----------+----------+--------------+  POP       Full            Yes       Yes                                    +---------+---------------+---------+-----------+----------+--------------+  PTV       Full                                                             +---------+---------------+---------+-----------+----------+--------------+  PERO      Full                                                             +---------+---------------+---------+-----------+----------+--------------+     Summary: BILATERAL: - No evidence of deep vein thrombosis seen in the lower extremities, bilaterally. - No evidence of superficial venous thrombosis in the lower extremities, bilaterally. -No evidence of popliteal cyst, bilaterally.   *See table(s) above for measurements and observations. Electronically signed by Deitra Mayo MD on 05/17/2021 at 6:19:00 PM.    Final     12-lead ECG EKG on arrival to ED shows sinus tachycardia at 114 (personally reviewed) All prior EKG's in EPIC reviewed with no documented atrial fibrillation  Telemetry CV strips available do not show AF. (personally reviewed)  Assessment and Plan:  1. Cryptogenic stroke The patient presents with cryptogenic stroke.  Neurology recommends she undergoes TEE and loop recorder. As most companies require short term monitoring prior to implantation of loop recorder, will plan to place Zio patch to bridge to loop with TEE. Loop can occur at time of TEE if unremarkable and Zio completed, or at subsequent office visit. Will discuss with MD.  I spoke at length with the patient about monitoring for afib with an implantable loop recorder.  Risks, benefits, and alteratives to implantable loop recorder were discussed with the patient today.  For  efficiencies sake, in shared decision making decision made to have monitor placed today, Have TEE as soon as possible, then have subsequent visit after both Monitor and TEE resulted to discuss and or place loop recorder.   Shirley Friar, PA-C 05/30/2021 10:36 AM

## 2021-05-30 ENCOUNTER — Ambulatory Visit (INDEPENDENT_AMBULATORY_CARE_PROVIDER_SITE_OTHER): Payer: Managed Care, Other (non HMO)

## 2021-05-30 ENCOUNTER — Other Ambulatory Visit: Payer: Self-pay

## 2021-05-30 ENCOUNTER — Ambulatory Visit: Payer: Managed Care, Other (non HMO) | Admitting: Student

## 2021-05-30 ENCOUNTER — Encounter: Payer: Self-pay | Admitting: Student

## 2021-05-30 VITALS — BP 116/66 | HR 101 | Ht 65.0 in | Wt 111.2 lb

## 2021-05-30 DIAGNOSIS — R002 Palpitations: Secondary | ICD-10-CM | POA: Diagnosis not present

## 2021-05-30 DIAGNOSIS — I639 Cerebral infarction, unspecified: Secondary | ICD-10-CM

## 2021-05-30 LAB — CBC
Hematocrit: 36.8 % (ref 34.0–46.6)
Hemoglobin: 12.7 g/dL (ref 11.1–15.9)
MCH: 32.8 pg (ref 26.6–33.0)
MCHC: 34.5 g/dL (ref 31.5–35.7)
MCV: 95 fL (ref 79–97)
Platelets: 479 10*3/uL — ABNORMAL HIGH (ref 150–450)
RBC: 3.87 x10E6/uL (ref 3.77–5.28)
RDW: 12.6 % (ref 11.7–15.4)
WBC: 13.7 10*3/uL — ABNORMAL HIGH (ref 3.4–10.8)

## 2021-05-30 LAB — BASIC METABOLIC PANEL
BUN/Creatinine Ratio: 15 (ref 9–23)
BUN: 11 mg/dL (ref 6–24)
CO2: 25 mmol/L (ref 20–29)
Calcium: 9.5 mg/dL (ref 8.7–10.2)
Chloride: 100 mmol/L (ref 96–106)
Creatinine, Ser: 0.72 mg/dL (ref 0.57–1.00)
Glucose: 84 mg/dL (ref 70–99)
Potassium: 5.3 mmol/L — ABNORMAL HIGH (ref 3.5–5.2)
Sodium: 138 mmol/L (ref 134–144)
eGFR: 101 mL/min/{1.73_m2} (ref >59)

## 2021-05-30 NOTE — Progress Notes (Unsigned)
Applied a 14 day Zio XT monitor to patient in the office  Courtney Mayer to read

## 2021-05-30 NOTE — Patient Instructions (Addendum)
You are scheduled for a TEE on 06/19/2021 with Dr. Acie Fredrickson.  Please arrive at the [facility] (Main Entrance A) at [facility]: [address] [place], [place] [address] at 10:00 am.   DIET: Nothing to eat or drink after midnight except a sip of water with medications (see medication instructions below)  FYI: For your safety, and to allow [place] to monitor your vital signs accurately during the surgery/procedure we request that   if you have artificial nails, gel coating, SNS etc. Please have those removed prior to your surgery/procedure. Not having the nail coverings /polish removed may result in cancellation or delay of your surgery/procedure.   Medication Instructions: You may take morning medications with a small sip of water.   Labs: TODAY: BMET, CBC  You must have a responsible person to drive you home and stay in the waiting area during your procedure. Failure to do so could result in cancellation.  Bring your insurance cards.  *Special Note: Every effort is made to have your procedure done on time. Occasionally there are emergencies that occur at the hospital that may cause delays. Please be patient if a delay does occur.    Follow Up: 07/23/2021 with Dr. Lovena Le  --------------------------------------------------------------------------------------------------------------------  [facility] Monitor Instructions  Your physician has requested you wear a ZIO patch monitor for 14 days.  This is a single patch monitor. [employer] supplies one patch monitor per enrollment. Additional stickers are not available. Please do not apply patch if you will be having a Nuclear Stress Test,  Echocardiogram, Cardiac CT, MRI, or Chest Xray during the period you would be wearing the  monitor. The patch cannot be worn during these tests. You cannot remove and re-apply the  ZIO XT patch monitor.   Billing and Patient Assistance Program Information  We have supplied [employer] with any of your  insurance information on file for billing purposes. [employer] offers a sliding scale Patient Assistance Program for patients that do not have  insurance, or whose insurance does not completely cover the cost of the ZIO monitor.  You must apply for the Patient Assistance Program to qualify for this discounted rate.  To apply, please call [employer] at [phone], select option 4, select option 2, ask to apply for  Patient Assistance Program. Theodore Demark will ask your household income, and how many people  are in your household. They will quote your out-of-pocket cost based on that information.  [employer] will also be able to set up a 20-month, interest-free payment plan if needed.  Applying the monitor   Shave hair from upper left chest.  Hold abrader disc by orange tab. Rub abrader in 40 strokes over the upper left chest as  indicated in your monitor instructions.  Clean area with 4 enclosed alcohol pads. Let dry.  Apply patch as indicated in monitor instructions. Patch will be placed under collarbone on left  side of chest with arrow pointing upward.  Rub patch adhesive wings for 2 minutes. Remove white label marked "1". Remove the white  label marked "2". Rub patch adhesive wings for 2 additional minutes.  While looking in a mirror, press and release button in center of patch. A small green light will  flash 3-4 times. This will be your only indicator that the monitor has been turned on.  Do not shower for the first 24 hours. You may shower after the first 24 hours.  Press the button if you feel a symptom. You will hear a small  click. Record Date, Time and  Symptom in the Patient Logbook.  When you are ready to remove the patch, follow instructions on the last 2 pages of Patient  Logbook. Stick patch monitor onto the last page of Patient Logbook.  Place Patient Logbook in the blue and white box. Use locking tab on box and tape box closed  securely. The blue and white box has prepaid postage on  it. Please place it in the mailbox as  soon as possible. Your physician should have your test results approximately 7 days after the  monitor has been mailed back to [facility].  Call [place] at [phone] if you have questions regarding  your ZIO XT patch monitor. Call them immediately if you see an orange light blinking on your  monitor.  If your monitor falls off in less than 4 days, contact our Monitor department at [phone].  If your monitor becomes loose or falls off after 4 days call [employer] at [phone] for  suggestions on securing your monitor

## 2021-06-11 ENCOUNTER — Encounter (HOSPITAL_COMMUNITY): Payer: Self-pay | Admitting: Cardiovascular Disease

## 2021-06-11 NOTE — Progress Notes (Signed)
Attempted to obtain medical history via telephone, unable to reach at this time. Unable to leave voicemail to return pre surgical testing department's phone call,due to mailbox full.  

## 2021-06-19 ENCOUNTER — Encounter (HOSPITAL_COMMUNITY)
Admission: RE | Disposition: A | Discharge status: Home / Self Care | Payer: Self-pay | Source: Home / Self Care | Attending: Cardiovascular Disease

## 2021-06-19 ENCOUNTER — Ambulatory Visit (HOSPITAL_COMMUNITY): Payer: Managed Care, Other (non HMO) | Admitting: Certified Registered"

## 2021-06-19 ENCOUNTER — Ambulatory Visit (HOSPITAL_BASED_OUTPATIENT_CLINIC_OR_DEPARTMENT_OTHER): Payer: Managed Care, Other (non HMO) | Admitting: Certified Registered"

## 2021-06-19 ENCOUNTER — Ambulatory Visit (HOSPITAL_COMMUNITY)
Admission: RE | Admit: 2021-06-19 | Discharge: 2021-06-19 | Disposition: A | Discharge status: Home / Self Care | Payer: Managed Care, Other (non HMO) | Attending: Cardiovascular Disease | Admitting: Cardiovascular Disease

## 2021-06-19 ENCOUNTER — Other Ambulatory Visit: Payer: Self-pay

## 2021-06-19 ENCOUNTER — Encounter (HOSPITAL_COMMUNITY): Payer: Self-pay | Admitting: Cardiovascular Disease

## 2021-06-19 ENCOUNTER — Ambulatory Visit (HOSPITAL_BASED_OUTPATIENT_CLINIC_OR_DEPARTMENT_OTHER)
Admission: RE | Admit: 2021-06-19 | Discharge: 2021-06-19 | Disposition: A | Discharge status: Home / Self Care | Payer: Managed Care, Other (non HMO) | Source: Ambulatory Visit | Attending: Student | Admitting: Student

## 2021-06-19 DIAGNOSIS — J449 Chronic obstructive pulmonary disease, unspecified: Secondary | ICD-10-CM

## 2021-06-19 DIAGNOSIS — I639 Cerebral infarction, unspecified: Secondary | ICD-10-CM | POA: Diagnosis not present

## 2021-06-19 DIAGNOSIS — E785 Hyperlipidemia, unspecified: Secondary | ICD-10-CM | POA: Diagnosis not present

## 2021-06-19 DIAGNOSIS — I10 Essential (primary) hypertension: Secondary | ICD-10-CM | POA: Diagnosis not present

## 2021-06-19 DIAGNOSIS — F17210 Nicotine dependence, cigarettes, uncomplicated: Secondary | ICD-10-CM | POA: Insufficient documentation

## 2021-06-19 DIAGNOSIS — I340 Nonrheumatic mitral (valve) insufficiency: Secondary | ICD-10-CM | POA: Diagnosis not present

## 2021-06-19 DIAGNOSIS — I63512 Cerebral infarction due to unspecified occlusion or stenosis of left middle cerebral artery: Secondary | ICD-10-CM

## 2021-06-19 HISTORY — PX: TEE WITHOUT CARDIOVERSION: SHX5443

## 2021-06-19 HISTORY — PX: BUBBLE STUDY: SHX6837

## 2021-06-19 SURGERY — ECHOCARDIOGRAM, TRANSESOPHAGEAL
Anesthesia: Monitor Anesthesia Care

## 2021-06-19 MED ORDER — SODIUM CHLORIDE 0.9 % IV SOLN: INTRAVENOUS | Status: DC

## 2021-06-19 MED ORDER — PROPOFOL 500 MG/50ML IV EMUL
INTRAVENOUS | Status: DC | PRN
  Administered 2021-06-19: 30 mg via INTRAVENOUS
  Administered 2021-06-19: 20 mg via INTRAVENOUS
  Administered 2021-06-19: 150 ug/kg/min via INTRAVENOUS

## 2021-06-19 MED ORDER — LIDOCAINE 2% (20 MG/ML) 5 ML SYRINGE
INTRAMUSCULAR | Status: DC | PRN
  Administered 2021-06-19: 50 mg via INTRAVENOUS

## 2021-06-19 NOTE — Anesthesia Preprocedure Evaluation (Addendum)
Anesthesia Evaluation  Patient identified by MRN, date of birth, ID band Patient awake    Reviewed: Allergy & Precautions, NPO status , Patient's Chart, lab work & pertinent test results  Airway Mallampati: II  TM Distance: >3 FB Neck ROM: Full    Dental  (+) Dental Advisory Given, Partial Lower, Partial Upper   Pulmonary COPD,  COPD inhaler, Current Smoker and Patient abstained from smoking.,    Pulmonary exam normal breath sounds clear to auscultation       Cardiovascular hypertension, Normal cardiovascular exam Rhythm:Regular Rate:Normal     Neuro/Psych Right upper extremity and right facial weakness Embolic stroke CVA, Residual Symptoms negative psych ROS   GI/Hepatic negative GI ROS, Neg liver ROS,   Endo/Other  Hyperlipidemia  Renal/GU negative Renal ROS  negative genitourinary   Musculoskeletal negative musculoskeletal ROS (+)   Abdominal   Peds  Hematology negative hematology ROS (+)   Anesthesia Other Findings   Reproductive/Obstetrics                            Anesthesia Physical Anesthesia Plan  ASA: 3  Anesthesia Plan: MAC   Post-op Pain Management:    Induction: Intravenous  PONV Risk Score and Plan: Treatment may vary due to age or medical condition  Airway Management Planned: Natural Airway and Simple Face Mask  Additional Equipment:   Intra-op Plan:   Post-operative Plan:   Informed Consent: I have reviewed the patients History and Physical, chart, labs and discussed the procedure including the risks, benefits and alternatives for the proposed anesthesia with the patient or authorized representative who has indicated his/her understanding and acceptance.     Dental advisory given  Plan Discussed with: CRNA and Anesthesiologist  Anesthesia Plan Comments:         Anesthesia Quick Evaluation

## 2021-06-19 NOTE — Interval H&P Note (Signed)
History and Physical Interval Note:  06/19/2021 10:19 AM  Courtney Mayer  has presented today for surgery, with the diagnosis of EMBOLIC STROKE.  The various methods of treatment have been discussed with the patient and family. After consideration of risks, benefits and other options for treatment, the patient has consented to  Procedure(s): TRANSESOPHAGEAL ECHOCARDIOGRAM (TEE) (N/A) as a surgical intervention.  The patient's history has been reviewed, patient examined, no change in status, stable for surgery.  I have reviewed the patient's chart and labs.  Questions were answered to the patient's satisfaction.     Mertie Moores

## 2021-06-19 NOTE — Anesthesia Postprocedure Evaluation (Signed)
Anesthesia Post Note  Patient: Azarya Oconnell  Procedure(s) Performed: TRANSESOPHAGEAL ECHOCARDIOGRAM (TEE) BUBBLE STUDY     Patient location during evaluation: PACU Anesthesia Type: MAC Level of consciousness: awake and alert and oriented Pain management: pain level controlled Vital Signs Assessment: post-procedure vital signs reviewed and stable Respiratory status: spontaneous breathing, nonlabored ventilation and respiratory function stable Cardiovascular status: stable and blood pressure returned to baseline Postop Assessment: no apparent nausea or vomiting Anesthetic complications: no   No notable events documented.  Last Vitals:  Vitals:   06/19/21 1054 06/19/21 1109  BP: 115/75 (!) 131/91  Pulse: 81 76  Resp: 16 13  Temp: (!) 36.3 C (!) 36.3 C  SpO2: 96% 94%    Last Pain:  Vitals:   06/19/21 1054  TempSrc:   PainSc: 0-No pain                 Nishika Parkhurst A.

## 2021-06-19 NOTE — CV Procedure (Signed)
° ° °  Transesophageal Echocardiogram Note  Courtney Mayer [id number] [birth year]  Procedure: Transesophageal Echocardiogram Indications: CVA   Procedure Details Consent: Obtained Time Out: Verified patient identification, verified procedure, site/side was marked, verified correct patient position, special equipment/implants available, Radiology Safety Procedures followed,  medications/allergies/relevent history reviewed, required imaging and test results available.  Performed  Medications:  During this procedure the patient is administered Lidocaine 50 mg IV followed by Propofol drip - total propofol 120 mg IV . The patient's heart rate, blood pressure, and oxygen saturation are monitored continuously during the procedure. The period of conscious sedation is 30  minutes, of which I was present face-to-face 100% of this time. 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   Trace MR   Aortic Valve: 3 leaflet valve.   There is a small calcification on the [place] .   No AS, no AI   Tricuspid Valve: normal , no TR  Pulmonic Valve: normal   Left Atrium/ Left atrial appendage: no thrombi .     Atrial septum: no evidence of ASD or PFO by color doppler or bubble contrast study   Aorta: normal    Complications: No apparent complications Patient did tolerate procedure well.   Thayer Headings, Brooke Bonito., MD, [facility] 06/19/2021, 10:48 AM

## 2021-06-19 NOTE — Anesthesia Procedure Notes (Signed)
Procedure Name: MAC Date/Time: 06/19/2021 10:34 AM Performed by: Barrington Ellison, CRNA Pre-anesthesia Checklist: Patient identified, Emergency Drugs available, Suction available, Patient being monitored and Timeout performed Patient Re-evaluated:Patient Re-evaluated prior to induction Oxygen Delivery Method: Nasal cannula

## 2021-06-19 NOTE — Transfer of Care (Signed)
Immediate Anesthesia Transfer of Care Note  Patient: Courtney Mayer  Procedure(s) Performed: TRANSESOPHAGEAL ECHOCARDIOGRAM (TEE) BUBBLE STUDY  Patient Location: PACU  Anesthesia Type:MAC  Level of Consciousness: drowsy and patient cooperative  Airway & Oxygen Therapy: Patient Spontanous Breathing  Post-op Assessment: Report given to RN  Post vital signs: Reviewed and stable  Last Vitals:  Vitals Value Taken Time  BP 115/75   Temp    Pulse 85 06/19/21 1052  Resp 12 06/19/21 1052  SpO2 96 % 06/19/21 1052  Vitals shown include unvalidated device data.  Last Pain:  Vitals:   06/19/21 1013  TempSrc: Temporal  PainSc: 0-No pain         Complications: No notable events documented.

## 2021-06-25 ENCOUNTER — Other Ambulatory Visit: Payer: Self-pay | Admitting: Student

## 2021-06-25 ENCOUNTER — Other Ambulatory Visit: Payer: Self-pay

## 2021-06-25 DIAGNOSIS — R931 Abnormal findings on diagnostic imaging of heart and coronary circulation: Secondary | ICD-10-CM

## 2021-06-25 MED ORDER — METOPROLOL TARTRATE 100 MG PO TABS: ORAL_TABLET | ORAL | 0 refills | Status: DC

## 2021-06-26 ENCOUNTER — Institutional Professional Consult (permissible substitution): Payer: Managed Care, Other (non HMO) | Admitting: Cardiology

## 2021-06-27 ENCOUNTER — Telehealth: Payer: Self-pay | Admitting: Internal Medicine

## 2021-06-27 NOTE — Telephone Encounter (Signed)
Patient calling in to see if she needs to be on blood thinner. Please advise ?

## 2021-06-27 NOTE — Telephone Encounter (Signed)
Per last OV take ASA 81mg , patient made aware  ? ?She would like [place] to alert her medical team that if she needs surgery- needs [facility] team to do surgery [facility])  ?

## 2021-06-27 NOTE — Telephone Encounter (Signed)
Attempted to call patient, no answer, unable to LM. Will send a patient message with update from Oda Kil[place], [place] ?

## 2021-07-02 NOTE — Telephone Encounter (Signed)
I spoke with pt this morning. She would like a referral to [facility]. I have called them and faxed everything they have requested. They will review and call the pt to get her scheduled.  ?I advised them that she would be having her CT Scan this Friday 07/05/2021. ? ?I spoke with Vaughan Basta in the referral Dept. ?Phone# [phone] ?Fax# [phone] ?

## 2021-07-02 NOTE — Telephone Encounter (Signed)
Hey looks like you marked a note in this patients chart, but it got routed back to me ?

## 2021-07-04 ENCOUNTER — Telehealth (HOSPITAL_COMMUNITY): Payer: Self-pay | Admitting: Emergency Medicine

## 2021-07-04 DIAGNOSIS — R931 Abnormal findings on diagnostic imaging of heart and coronary circulation: Secondary | ICD-10-CM

## 2021-07-04 DIAGNOSIS — R002 Palpitations: Secondary | ICD-10-CM

## 2021-07-04 MED ORDER — IVABRADINE HCL 5 MG PO TABS: 15.0000 mg | ORAL_TABLET | Freq: Once | ORAL | 0 refills | Status: AC

## 2021-07-04 NOTE — Telephone Encounter (Signed)
Reaching out to patient to offer assistance regarding upcoming cardiac imaging study; pt verbalizes understanding of appt date/time, parking situation and where to check in, pre-test NPO status and medications ordered, and verified current allergies; name and call back number provided for further questions should they arise ?Marchia Bond RN Navigator Cardiac Imaging ?[facility] ?[phone] office ?[phone] cell ? ?'100mg'$  metoprolol + '15mg'$  ivabradine ?Denies iv issues ?Arrival 1230 ?

## 2021-07-04 NOTE — Telephone Encounter (Signed)
Unable to leave VM as box is full.  ? ?Sent in one time dose '15mg'$  ivabradine for HR control for CCTA tomorrow (to be taken in addition to '100mg'$  metoprolol, HR 101) ? ?Marchia Bond RN Navigator Cardiac Imaging ?[facility] ?[phone] Office  ?[phone] Cell ? ?

## 2021-07-05 ENCOUNTER — Other Ambulatory Visit: Payer: Self-pay

## 2021-07-05 ENCOUNTER — Ambulatory Visit (HOSPITAL_COMMUNITY)
Admission: RE | Admit: 2021-07-05 | Discharge: 2021-07-05 | Disposition: A | Discharge status: Home / Self Care | Payer: Managed Care, Other (non HMO) | Source: Ambulatory Visit | Attending: Student | Admitting: Student

## 2021-07-05 ENCOUNTER — Ambulatory Visit (HOSPITAL_COMMUNITY): Payer: Managed Care, Other (non HMO)

## 2021-07-05 DIAGNOSIS — R931 Abnormal findings on diagnostic imaging of heart and coronary circulation: Secondary | ICD-10-CM | POA: Diagnosis present

## 2021-07-05 DIAGNOSIS — R9430 Abnormal result of cardiovascular function study, unspecified: Secondary | ICD-10-CM | POA: Diagnosis not present

## 2021-07-05 MED ORDER — IOHEXOL 350 MG/ML SOLN
95.0000 mL | Freq: Once | INTRAVENOUS | Status: AC | PRN
  Administered 2021-07-05: 95 mL via INTRAVENOUS

## 2021-07-05 MED ORDER — NITROGLYCERIN 0.4 MG SL SUBL
0.8000 mg | SUBLINGUAL_TABLET | Freq: Once | SUBLINGUAL | Status: AC
  Administered 2021-07-05: 0.8 mg via SUBLINGUAL

## 2021-07-05 MED ORDER — NITROGLYCERIN 0.4 MG SL SUBL
SUBLINGUAL_TABLET | SUBLINGUAL | Status: AC
  Filled 2021-07-05: qty 1

## 2021-07-05 NOTE — Telephone Encounter (Signed)
Hey this patient is messaging you back, but it keeps coming to me so I am just sending it back to you!  ?

## 2021-07-09 NOTE — Telephone Encounter (Signed)
Referral was not received  ?

## 2021-07-10 ENCOUNTER — Telehealth: Payer: Self-pay | Admitting: Internal Medicine

## 2021-07-10 NOTE — Telephone Encounter (Signed)
Calling for update concerning the mychart message. Please advise ?

## 2021-07-10 NOTE — Telephone Encounter (Signed)
Hey there, you are last in communication with this patient so I am routing to you  ?

## 2021-07-10 NOTE — Telephone Encounter (Signed)
Patient following up with you 

## 2021-07-23 ENCOUNTER — Ambulatory Visit: Payer: Managed Care, Other (non HMO) | Admitting: Internal Medicine

## 2021-07-23 NOTE — Telephone Encounter (Signed)
I was able to get in touch with the pt today (she works 3rd shift) She stated that she had not seen the Surgeon yet. He looked at her records and didn't think she needed to be seen by him. After she called them explaining everything (stroke) they decided to schedule her an appt with Dr Donah Driver on 08/06/2021. ?If she isn't satisfied with that appointment she will call us and wants to be referred to the surgeon in [place].  ?

## 2021-07-26 ENCOUNTER — Encounter: Payer: Managed Care, Other (non HMO) | Admitting: Thoracic Surgery (Cardiothoracic Vascular Surgery)

## 2021-08-12 ENCOUNTER — Telehealth: Payer: Self-pay | Admitting: Neurology

## 2021-08-12 ENCOUNTER — Inpatient Hospital Stay: Payer: Managed Care, Other (non HMO) | Admitting: Neurology

## 2021-08-12 NOTE — Telephone Encounter (Signed)
Pt cancelled appt due to do not need the appt, going to cardiologist, having heart surgery 08/14/21. Transferred to Billing. ?

## 2023-05-26 ENCOUNTER — Ambulatory Visit: Payer: 59 | Admitting: Neurology

## 2023-05-26 ENCOUNTER — Encounter: Payer: Self-pay | Admitting: Neurology

## 2023-05-26 ENCOUNTER — Other Ambulatory Visit: Payer: Self-pay | Admitting: Neurology

## 2023-05-26 ENCOUNTER — Telehealth: Payer: Self-pay

## 2023-05-26 VITALS — BP 108/64 | Ht 65.0 in | Wt 115.0 lb

## 2023-05-26 DIAGNOSIS — I63512 Cerebral infarction due to unspecified occlusion or stenosis of left middle cerebral artery: Secondary | ICD-10-CM | POA: Diagnosis not present

## 2023-05-26 DIAGNOSIS — R410 Disorientation, unspecified: Secondary | ICD-10-CM | POA: Insufficient documentation

## 2023-05-26 DIAGNOSIS — I671 Cerebral aneurysm, nonruptured: Secondary | ICD-10-CM

## 2023-05-26 DIAGNOSIS — R42 Dizziness and giddiness: Secondary | ICD-10-CM

## 2023-05-26 HISTORY — DX: Cerebral aneurysm, nonruptured: I67.1

## 2023-05-26 MED ORDER — ATORVASTATIN CALCIUM 20 MG PO TABS: 20.0000 mg | ORAL_TABLET | Freq: Every day | ORAL | 3 refills | Status: AC

## 2023-05-26 NOTE — Progress Notes (Signed)
Chief Complaint  Patient presents with   New Patient (Initial Visit)    Rm 15, NP, memory concerns, with mom Debbie, MOCA       ASSESSMENT AND PLAN  Courtney Mayer is a 53 y.o. 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   MEDICAL HISTORY:  Courtney Mayer, is a 53 year old female, accompanied by her mother seen in request by her primary care doctor Mare Ferrari, to follow-up for stroke, confusion spells, initial evaluation May 26, 2023  History is obtained from the patient and review of electronic medical records. I personally reviewed pertinent available imaging films in PACS.   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, measuring 4.6 x 4.4 mm,  CT angiogram of the heart, with calcium score of 25.2, no obstructive coronary artery noted  She underwent sternotomy for resection of the aortic valve mass, as well as aortic replacement with bovine pericardial tissue valve, aortic root enlargement, and bovine pericardial patch on August 14, 2021, aching aspirin 81 mg, no recurrent strokelike symptoms,  She went on disability since May 2024 due to her worsening lung issues, now home oxygen dependent, continue to smoke 1 and half pack a day, she lives with her mother, who has to keep a close eye on her because her slow worsening memory loss since stroke, there was also reported  sudden onset confusion spells, she will talk out of her head, disoriented, sometimes there was associated low oxygen level, but there was no clear triggers.  There was no seizure activity, occasionally body myoclonic jerking   she also has frequent dizziness spells, not necessarily associated with positional change, sudden onset, lasting for few minutes,  Maternal grandmother has dementia, she has slow worsening short-term memory loss, word finding difficulties, forget people's name,    PHYSICAL EXAM:   Vitals:   05/26/23 1109  BP: 108/64  Weight: 115 lb (52.2 kg)  Height: 5\' 5"  (1.651 m)   Not recorded     Body mass index is 19.14 kg/m.  PHYSICAL EXAMNIATION:  Gen: NAD,  conversant, well nourised, well groomed                     Cardiovascular: Regular rate rhythm, no peripheral edema, warm, nontender. Eyes: Conjunctivae clear without exudates or hemorrhage Neck: Supple, no carotid bruits. Pulmonary: Clear to auscultation bilaterally   NEUROLOGICAL EXAM:  MENTAL STATUS: Speech/cognition: Fragile middle aged  female, Awake, alert, oriented to history taking and casual conversation CRANIAL NERVES: CN II: Visual fields are full to confrontation. Pupils are round equal and briskly reactive to light. CN III, IV, VI: extraocular movement are normal. No ptosis. CN V: Facial sensation is intact to light touch CN VII: Face is symmetric with normal eye closure  CN VIII: Hearing is normal to causal conversation. CN IX, X: Phonation is normal. CN XI: Head turning and shoulder shrug are intact  MOTOR: There is no pronator drift of out-stretched arms. Muscle bulk and tone are normal. Muscle strength is normal.  REFLEXES: Reflexes are 2+ and symmetric at the biceps, triceps, knees, and ankles. Plantar responses are flexor.  SENSORY: Intact to light touch, pinprick and vibratory sensation are intact in fingers and toes.  COORDINATION: There is no trunk or limb dysmetria noted.  GAIT/STANCE: Posture is normal. Gait is steady with normal   REVIEW OF SYSTEMS:  Full 14 system review of systems performed and notable only for as above All other review of systems were negative.   ALLERGIES: Allergies  Allergen Reactions   Tramadol Palpitations    Can't take Ultram ER - palpitations  Regular Tramadol is fine to take Can't take Ultram ER - palpitations  Regular Tramadol is fine to take    Amoxicillin Diarrhea   Compazine [Prochlorperazine] Other (See Comments)    Excitability   Compazine [Prochlorperazine] Anxiety    HOME MEDICATIONS: Current Outpatient Medications  Medication Sig Dispense Refill   acyclovir (ZOVIRAX) 400 MG tablet Take 400 mg by mouth 2 (two) times daily as needed (Flair up).     albuterol (VENTOLIN HFA) 108 (90 Base) MCG/ACT inhaler Inhale 2 puffs into the lungs every 6 (six) hours as needed for wheezing or shortness of breath.     ALPRAZolam (XANAX) 0.5 MG tablet Take 0.5 mg by mouth 3 (three) times daily as needed for anxiety.     amitriptyline (ELAVIL) 150 MG tablet Take  300 mg by mouth at bedtime.     aspirin 81 MG chewable tablet Chew 1 tablet (81 mg total) by mouth daily. 30 tablet 0   docusate sodium (COLACE) 100 MG capsule Take 100 mg by mouth daily.     gabapentin (NEURONTIN) 600 MG tablet Take 900 mg by mouth QID.     LATUDA 60 MG TABS Take 60 mg by mouth at bedtime.     montelukast (SINGULAIR) 10 MG tablet Take 10 mg by mouth at bedtime.     oxyCODONE-acetaminophen (PERCOCET) 10-325 MG tablet Take 1 tablet by mouth 4 (four) times daily as needed for pain.     pantoprazole (PROTONIX) 40 MG tablet Take 40 mg by mouth daily.     promethazine (PHENERGAN) 25 MG tablet Take 25 mg by mouth every 6 (six) hours as needed for nausea or vomiting.     QUEtiapine (SEROQUEL) 50 MG tablet Take 100 mg by mouth at bedtime.     TRELEGY ELLIPTA 100-62.5-25 MCG/INH AEPB Inhale 1 puff into the lungs daily as needed (Chest tightness and cough).     ALPRAZolam (XANAX) 0.25 MG tablet Take 1 mg by  mouth daily as needed for anxiety or sleep. (Patient not taking: Reported on 05/26/2023)     methocarbamol (ROBAXIN) 750 MG tablet Take 1,500 mg by mouth 4 (four) times daily. (Patient not taking: Reported on 05/26/2023)     metoprolol tartrate (LOPRESSOR) 100 MG tablet Take 1 tablet 2 hours prior to CT Scan. (Patient not taking: Reported on 05/26/2023) 1 tablet 0   nicotine (NICODERM CQ - DOSED IN MG/24 HOURS) 21 mg/24hr patch Place 1 patch (21 mg total) onto the skin daily. (Patient not taking: Reported on 05/26/2023) 28 patch 0   OXYCONTIN 30 MG 12 hr tablet Take 30 tablets by mouth 2 (two) times daily. (Patient not taking: Reported on 05/26/2023)     rosuvastatin (CRESTOR) 20 MG tablet Take 1 tablet (20 mg total) by mouth daily. (Patient not taking: Reported on 05/26/2023) 30 tablet 0   No current facility-administered medications for this visit.    PAST MEDICAL HISTORY: Past Medical History:  Diagnosis Date   Back pain    Cancer (HCC)    lung   COPD (chronic obstructive pulmonary  disease) (HCC)     PAST SURGICAL HISTORY: Past Surgical History:  Procedure Laterality Date   atrial valve replacement     BREAST SURGERY Right    cyst removal   BUBBLE STUDY  06/19/2021   Procedure: BUBBLE STUDY;  Surgeon: Vesta Mixer, MD;  Location: [facility] ENDOSCOPY;  Service: Cardiovascular;;   CESAREAN SECTION     LOBECTOMY     LUNG SURGERY Left    TEE WITHOUT CARDIOVERSION N/A 06/19/2021   Procedure: TRANSESOPHAGEAL ECHOCARDIOGRAM (TEE);  Surgeon: Elease Hashimoto, Deloris Ping, MD;  Location: [facility] ENDOSCOPY;  Service: Cardiovascular;  Laterality: N/A;   WRIST FRACTURE SURGERY      FAMILY HISTORY: Family History  Problem Relation Age of Onset   Healthy Mother    Healthy Father     SOCIAL HISTORY: Social History   Socioeconomic History   Marital status: Single    Spouse name: Not on file   Number of children: Not on file   Years of education: Not on file   Highest education level: Not on file  Occupational History   Not on file  Tobacco Use   Smoking status: Every Day    Current packs/day: 1.50    Types: Cigarettes   Smokeless tobacco: Never  Vaping Use   Vaping status: Every Day   Substances: Nicotine  Substance and Sexual Activity   Alcohol use: Not Currently   Drug use: Yes    Types: Marijuana   Sexual activity: Not on file  Other Topics Concern   Not on file  Social History Narrative   Right handed   Caffiene none   Lives with mom,    widowed   Social Drivers of Health   Financial Resource Strain: Low Risk  (08/14/2021)   Received from [facility]   Overall Financial Resource Strain (CARDIA)    Difficulty of Paying Living Expenses: Not hard at all  Food Insecurity: No Food Insecurity (09/18/2022)   Received from [facility]   Hunger Vital Sign    Worried About Running Out of Food in the Last Year: Never true    Ran Out of Food in the Last Year: Never true  Transportation Needs: No Transportation Needs (09/18/2022)   Received from [facility] - Transportation    Lack of Transportation (Medical): No    Lack of Transportation (Non-Medical): No  Physical Activity: Unknown (08/04/2021)  Received from [facility]   Exercise Vital Sign    Days of Exercise per Week: 0 days    Minutes of Exercise per Session: Not on file  Stress: Stress Concern Present (09/18/2022)   Received from [facility] - Occupational Stress Questionnaire    Feeling of Stress : To some extent  Social Connections: Somewhat Isolated (08/04/2021)   Received from [facility]   Social Network    How would you rate your social network (family, work, friends)?: Restricted participation with some degree of social isolation  Intimate Partner Violence: Not At Risk (09/18/2022)   Received from [facility]   HITS    Over the last 12 months how often did your partner physically hurt you?: Never    Over the last 12 months how often did your partner insult you or talk down to you?: Never    Over the last 12 months how often did your partner threaten you with physical harm?: Never    Over the last 12 months how often did your partner scream or curse at you?: Never      Levert Feinstein, M.D. Ph.D.  [facility] [address], [address] [place], [place] [address] Ph: [phone] Fax: [phone]  CC:  Mare Ferrari, MD [address] [place],  [place] [address]  Mare Ferrari, MD

## 2023-05-26 NOTE — Telephone Encounter (Signed)
Courtney Mayer

## 2023-05-28 ENCOUNTER — Telehealth: Payer: Self-pay | Admitting: Neurology

## 2023-05-28 NOTE — Telephone Encounter (Signed)
MRI Tasia Catchings: [id number] exp. 05/28/23-07/12/23  CTA head/neck Tasia Catchings: [id number] exp. 05/28/23-07/12/23 sent to [facility] [phone]

## 2023-06-02 ENCOUNTER — Telehealth: Payer: Self-pay | Admitting: Neurology

## 2023-06-02 ENCOUNTER — Other Ambulatory Visit: Payer: 59 | Admitting: *Deleted

## 2023-06-02 NOTE — Telephone Encounter (Signed)
At 9:55 pt left vm she is sick, needs to cx the EEG scheduled for today.

## 2023-06-02 NOTE — Telephone Encounter (Signed)
Rescheduled EEG appointment

## 2023-06-10 ENCOUNTER — Encounter (HOSPITAL_COMMUNITY): Payer: Self-pay

## 2023-06-10 ENCOUNTER — Ambulatory Visit (HOSPITAL_COMMUNITY)
Admission: RE | Admit: 2023-06-10 | Discharge: 2023-06-10 | Disposition: A | Discharge status: Home / Self Care | Payer: 59 | Source: Ambulatory Visit | Attending: Neurology | Admitting: Neurology

## 2023-06-10 ENCOUNTER — Ambulatory Visit (HOSPITAL_COMMUNITY): Payer: 59

## 2023-06-10 DIAGNOSIS — I671 Cerebral aneurysm, nonruptured: Secondary | ICD-10-CM | POA: Diagnosis present

## 2023-06-10 DIAGNOSIS — I63512 Cerebral infarction due to unspecified occlusion or stenosis of left middle cerebral artery: Secondary | ICD-10-CM | POA: Insufficient documentation

## 2023-06-10 DIAGNOSIS — R410 Disorientation, unspecified: Secondary | ICD-10-CM | POA: Insufficient documentation

## 2023-06-10 MED ORDER — IOHEXOL 350 MG/ML SOLN
60.0000 mL | Freq: Once | INTRAVENOUS | Status: AC | PRN
  Administered 2023-06-10: 60 mL via INTRAVENOUS

## 2023-06-12 DIAGNOSIS — I63512 Cerebral infarction due to unspecified occlusion or stenosis of left middle cerebral artery: Secondary | ICD-10-CM | POA: Diagnosis not present

## 2023-06-12 DIAGNOSIS — R42 Dizziness and giddiness: Secondary | ICD-10-CM | POA: Diagnosis not present

## 2023-06-17 ENCOUNTER — Other Ambulatory Visit: Payer: 59 | Admitting: *Deleted

## 2023-06-19 ENCOUNTER — Telehealth: Payer: Self-pay | Admitting: *Deleted

## 2023-06-19 NOTE — Telephone Encounter (Signed)
 I called patient, MRI showed tiny acute infarction at the posterior left parietal lobe, she is at her baseline now  CT angiogram head and neck showed no significant stenosis, stable bilateral MCA bifurcation aneurysm 4 mm on the left 3 mm on the right  Keep current medications including aspirin 81 mg daily IMPRESSION: *Tiny acute infarct in the posterior left parietal lobe.    *No acute intracranial process. *No hemodynamically significant stenosis of the intracranial or extracranial vasculature. *Similar bilateral MCA bifurcation aneurysms measuring 4 mm on the left and 3 mm on the right. *Please see above for more details.

## 2023-06-19 NOTE — Telephone Encounter (Signed)
 Ray w/ [facility] called report on MRI head. See below. I called Dr Terrace Arabia to let her know. Information forwarded to her for further review.   Study Result  Narrative & Impression  CLINICAL DATA:  Stroke suspected   EXAM: MRI HEAD WITHOUT CONTRAST   TECHNIQUE: Multiplanar, multiecho pulse sequences of the brain and surrounding structures were obtained without intravenous contrast.   COMPARISON:  05/15/2021 unenhanced brain MRI   FINDINGS: Tiny focus of abnormal restricted diffusion in the posterior left parietal lobe on image 40, series 2 consistent with a tiny acute infarct.   No suspicious extra-axial fluid.   No midline shift.   Ventricular system is normal in size and configuration.   Old scattered infarcts are noted on the left corresponding to the acute infarcts identified on the comparison examination.   Small left mastoid effusion.   IMPRESSION: *Tiny acute infarct in the posterior left parietal lobe. *Please see above for more details.     Electronically Signed   By: Ala Bent M.D.   On: 06/18/2023 17:31

## 2023-06-26 ENCOUNTER — Telehealth: Payer: Self-pay | Admitting: Neurology

## 2023-06-26 ENCOUNTER — Encounter: Payer: Self-pay | Admitting: Neurology

## 2023-06-26 DIAGNOSIS — I63512 Cerebral infarction due to unspecified occlusion or stenosis of left middle cerebral artery: Secondary | ICD-10-CM

## 2023-06-26 MED ORDER — CLOPIDOGREL BISULFATE 75 MG PO TABS: 75.0000 mg | ORAL_TABLET | Freq: Every day | ORAL | 3 refills | Status: AC

## 2023-06-26 MED ORDER — CLOPIDOGREL BISULFATE 75 MG PO TABS
75.0000 mg | ORAL_TABLET | Freq: Every day | ORAL | 3 refills | Status: DC
Start: 2023-06-26 — End: 2023-06-26

## 2023-06-26 NOTE — Telephone Encounter (Signed)
 I called patient for new stroke on left parietal region.   We will change to plavix,  Stop asa.  She also complains of memory issues. Labs were ordered    IMPRESSION: *No acute intracranial process. *No hemodynamically significant stenosis of the intracranial or extracranial vasculature. *Similar bilateral MCA bifurcation aneurysms measuring 4 mm on the left and 3 mm on the right. *Please see above for more details.   IMPRESSION: *Tiny acute infarct in the posterior left parietal lobe. *Please see above for more details.

## 2023-07-07 ENCOUNTER — Ambulatory Visit: Payer: 59 | Admitting: Neurology

## 2023-07-07 DIAGNOSIS — R410 Disorientation, unspecified: Secondary | ICD-10-CM | POA: Diagnosis not present

## 2023-07-07 DIAGNOSIS — I63512 Cerebral infarction due to unspecified occlusion or stenosis of left middle cerebral artery: Secondary | ICD-10-CM

## 2023-07-07 DIAGNOSIS — I671 Cerebral aneurysm, nonruptured: Secondary | ICD-10-CM

## 2023-07-14 ENCOUNTER — Ambulatory Visit: Attending: Neurology

## 2023-07-14 DIAGNOSIS — R410 Disorientation, unspecified: Secondary | ICD-10-CM

## 2023-07-14 DIAGNOSIS — R42 Dizziness and giddiness: Secondary | ICD-10-CM

## 2023-07-14 DIAGNOSIS — I63512 Cerebral infarction due to unspecified occlusion or stenosis of left middle cerebral artery: Secondary | ICD-10-CM

## 2023-07-14 DIAGNOSIS — I671 Cerebral aneurysm, nonruptured: Secondary | ICD-10-CM

## 2023-07-16 NOTE — Procedures (Signed)
   HISTORY: 53 year old female with history of embolic stroke, presenting with intermittent confusion  TECHNIQUE:  This is a routine 16 channel EEG recording with one channel devoted to a limited EKG recording.  It was performed during wakefulness, drowsiness and asleep. Photic stimulation were performed as activating procedures.  There are minimum muscle and movement artifact noted.  Upon maximum arousal, posterior dominant waking rhythm consistent of rhythmic alpha range activity. Activities are symmetric over the bilateral posterior derivations and attenuated with eye opening.  Photic stimulation did not alter the tracing.  Hyperventilation was not performed due to history of stroke and COPD  During EEG recording, patient developed drowsiness and no deeper stage of sleep was achieved During EEG recording, there was no epileptiform discharge noted.  EKG demonstrate normal sinus rhythm.  CONCLUSION: This is a  normal awake EEG.  There is no electrodiagnostic evidence of epileptiform discharge.  Levert Feinstein, M.D. Ph.D.  [facility] [address] [place], [place] [address] Phone: [phone] Fax:      [phone]

## 2023-07-20 ENCOUNTER — Encounter: Payer: Self-pay | Admitting: Neurology

## 2023-09-15 ENCOUNTER — Ambulatory Visit: Payer: 59 | Admitting: Neurology

## 2023-10-09 ENCOUNTER — Other Ambulatory Visit: Payer: Self-pay | Admitting: Internal Medicine

## 2023-10-09 ENCOUNTER — Other Ambulatory Visit: Payer: Self-pay

## 2023-10-09 DIAGNOSIS — J189 Pneumonia, unspecified organism: Secondary | ICD-10-CM

## 2023-10-09 DIAGNOSIS — C3412 Malignant neoplasm of upper lobe, left bronchus or lung: Secondary | ICD-10-CM

## 2023-10-13 ENCOUNTER — Other Ambulatory Visit

## 2023-10-15 ENCOUNTER — Ambulatory Visit

## 2023-10-15 DIAGNOSIS — C3412 Malignant neoplasm of upper lobe, left bronchus or lung: Secondary | ICD-10-CM

## 2023-10-15 DIAGNOSIS — J189 Pneumonia, unspecified organism: Secondary | ICD-10-CM | POA: Diagnosis not present

## 2023-10-20 ENCOUNTER — Ambulatory Visit: Admitting: Neurology

## 2023-10-20 ENCOUNTER — Encounter: Payer: Self-pay | Admitting: Neurology

## 2023-11-30 ENCOUNTER — Ambulatory Visit: Payer: 59 | Admitting: Family Medicine

## 2023-12-11 ENCOUNTER — Other Ambulatory Visit: Payer: Self-pay | Admitting: Internal Medicine

## 2023-12-11 DIAGNOSIS — J189 Pneumonia, unspecified organism: Secondary | ICD-10-CM

## 2023-12-15 ENCOUNTER — Ambulatory Visit (INDEPENDENT_AMBULATORY_CARE_PROVIDER_SITE_OTHER)

## 2023-12-15 DIAGNOSIS — Z85118 Personal history of other malignant neoplasm of bronchus and lung: Secondary | ICD-10-CM | POA: Diagnosis not present

## 2023-12-15 DIAGNOSIS — J189 Pneumonia, unspecified organism: Secondary | ICD-10-CM | POA: Diagnosis not present

## 2023-12-17 ENCOUNTER — Other Ambulatory Visit: Payer: Self-pay | Admitting: Student in an Organized Health Care Education/Training Program

## 2023-12-17 ENCOUNTER — Emergency Department

## 2023-12-17 ENCOUNTER — Other Ambulatory Visit: Payer: Self-pay

## 2023-12-17 ENCOUNTER — Emergency Department
Admission: EM | Admit: 2023-12-17 | Discharge: 2023-12-17 | Disposition: A | Discharge status: Home / Self Care | Source: Ambulatory Visit | Attending: Emergency Medicine | Admitting: Emergency Medicine

## 2023-12-17 DIAGNOSIS — J181 Lobar pneumonia, unspecified organism: Secondary | ICD-10-CM | POA: Diagnosis not present

## 2023-12-17 DIAGNOSIS — F17200 Nicotine dependence, unspecified, uncomplicated: Secondary | ICD-10-CM | POA: Diagnosis not present

## 2023-12-17 DIAGNOSIS — J189 Pneumonia, unspecified organism: Secondary | ICD-10-CM

## 2023-12-17 DIAGNOSIS — J9811 Atelectasis: Secondary | ICD-10-CM

## 2023-12-17 DIAGNOSIS — Z85118 Personal history of other malignant neoplasm of bronchus and lung: Secondary | ICD-10-CM | POA: Insufficient documentation

## 2023-12-17 DIAGNOSIS — R079 Chest pain, unspecified: Secondary | ICD-10-CM

## 2023-12-17 DIAGNOSIS — R059 Cough, unspecified: Secondary | ICD-10-CM | POA: Diagnosis present

## 2023-12-17 DIAGNOSIS — J449 Chronic obstructive pulmonary disease, unspecified: Secondary | ICD-10-CM | POA: Insufficient documentation

## 2023-12-17 HISTORY — DX: Pneumonia, unspecified organism: J18.9

## 2023-12-17 LAB — BASIC METABOLIC PANEL WITH GFR
Anion gap: 12 (ref 5–15)
BUN: 13 mg/dL (ref 6–20)
CO2: 23 mmol/L (ref 22–32)
Calcium: 9.2 mg/dL (ref 8.9–10.3)
Chloride: 103 mmol/L (ref 98–111)
Creatinine, Ser: 0.89 mg/dL (ref 0.44–1.00)
GFR, Estimated: >60 mL/min (ref >60)
Glucose, Bld: 138 mg/dL — ABNORMAL HIGH (ref 70–99)
Potassium: 4.2 mmol/L (ref 3.5–5.1)
Sodium: 138 mmol/L (ref 135–145)

## 2023-12-17 LAB — BLOOD GAS, VENOUS
Acid-Base Excess: 3.9 mmol/L — ABNORMAL HIGH (ref 0.0–2.0)
Bicarbonate: 30.2 mmol/L — ABNORMAL HIGH (ref 20.0–28.0)
O2 Saturation: 78.4 %
Patient temperature: 37
pCO2, Ven: 51 mmHg (ref 44–60)
pH, Ven: 7.38 (ref 7.25–7.43)
pO2, Ven: 43 mmHg (ref 32–45)

## 2023-12-17 LAB — CBC
HCT: 42.1 % (ref 36.0–46.0)
Hemoglobin: 13.8 g/dL (ref 12.0–15.0)
MCH: 32.4 pg (ref 26.0–34.0)
MCHC: 32.8 g/dL (ref 30.0–36.0)
MCV: 98.8 fL (ref 80.0–100.0)
Platelets: 462 K/uL — ABNORMAL HIGH (ref 150–400)
RBC: 4.26 MIL/uL (ref 3.87–5.11)
RDW: 14.6 % (ref 11.5–15.5)
WBC: 11.2 K/uL — ABNORMAL HIGH (ref 4.0–10.5)
nRBC: 0 % (ref 0.0–0.2)

## 2023-12-17 LAB — PROTIME-INR
INR: 1 (ref 0.8–1.2)
Prothrombin Time: 14.1 s (ref 11.4–15.2)

## 2023-12-17 LAB — COMPREHENSIVE METABOLIC PANEL WITH GFR
ALT: 9 U/L (ref 0–44)
AST: 15 U/L (ref 15–41)
Albumin: 3.6 g/dL (ref 3.5–5.0)
Alkaline Phosphatase: 78 U/L (ref 38–126)
Anion gap: 13 (ref 5–15)
BUN: 13 mg/dL (ref 6–20)
CO2: 26 mmol/L (ref 22–32)
Calcium: 9.8 mg/dL (ref 8.9–10.3)
Chloride: 99 mmol/L (ref 98–111)
Creatinine, Ser: 1.08 mg/dL — ABNORMAL HIGH (ref 0.44–1.00)
GFR, Estimated: >60 mL/min (ref >60)
Glucose, Bld: 86 mg/dL (ref 70–99)
Potassium: 4.3 mmol/L (ref 3.5–5.1)
Sodium: 138 mmol/L (ref 135–145)
Total Bilirubin: 0.4 mg/dL (ref 0.0–1.2)
Total Protein: 8 g/dL (ref 6.5–8.1)

## 2023-12-17 LAB — RESP PANEL BY RT-PCR (RSV, FLU A&B, COVID)  RVPGX2
Influenza A by PCR: NEGATIVE
Influenza B by PCR: NEGATIVE
Resp Syncytial Virus by PCR: NEGATIVE
SARS Coronavirus 2 by RT PCR: NEGATIVE

## 2023-12-17 LAB — TROPONIN I (HIGH SENSITIVITY)
Troponin I (High Sensitivity): 3 ng/L (ref <18)
Troponin I (High Sensitivity): 4 ng/L (ref <18)

## 2023-12-17 LAB — LACTIC ACID, PLASMA
Lactic Acid, Venous: 0.6 mmol/L (ref 0.5–1.9)
Lactic Acid, Venous: 0.8 mmol/L (ref 0.5–1.9)

## 2023-12-17 MED ORDER — PREDNISONE 20 MG PO TABS: 20.0000 mg | ORAL_TABLET | Freq: Every day | ORAL | 0 refills | Status: DC

## 2023-12-17 MED ORDER — AMOXICILLIN-POT CLAVULANATE 875-125 MG PO TABS: 1.0000 | ORAL_TABLET | Freq: Two times a day (BID) | ORAL | 0 refills | Status: AC

## 2023-12-17 MED ORDER — FENTANYL CITRATE PF 50 MCG/ML IJ SOSY
50.0000 ug | PREFILLED_SYRINGE | Freq: Once | INTRAMUSCULAR | Status: AC
  Administered 2023-12-17: 50 ug via INTRAVENOUS
  Filled 2023-12-17: qty 1

## 2023-12-17 MED ORDER — LACTATED RINGERS IV BOLUS
500.0000 mL | Freq: Once | INTRAVENOUS | Status: AC
  Administered 2023-12-17: 500 mL via INTRAVENOUS

## 2023-12-17 MED ORDER — SODIUM CHLORIDE 0.9 % IV SOLN
2.0000 g | Freq: Once | INTRAVENOUS | Status: AC
  Administered 2023-12-17: 2 g via INTRAVENOUS
  Filled 2023-12-17: qty 20

## 2023-12-17 MED ORDER — SODIUM CHLORIDE 0.9 % IV SOLN
500.0000 mg | Freq: Once | INTRAVENOUS | Status: AC
  Administered 2023-12-17: 500 mg via INTRAVENOUS
  Filled 2023-12-17: qty 5

## 2023-12-17 MED ORDER — NICOTINE 21 MG/24HR TD PT24
21.0000 mg | MEDICATED_PATCH | Freq: Once | TRANSDERMAL | Status: DC
  Administered 2023-12-17: 21 mg via TRANSDERMAL
  Filled 2023-12-17: qty 1

## 2023-12-17 MED ORDER — OXYCODONE-ACETAMINOPHEN 10-325 MG PO TABS: 1.0000 | ORAL_TABLET | Freq: Four times a day (QID) | ORAL | 0 refills | Status: AC | PRN

## 2023-12-17 MED ORDER — LACTATED RINGERS IV BOLUS (SEPSIS)
1000.0000 mL | Freq: Once | INTRAVENOUS | Status: AC
  Administered 2023-12-17: 1000 mL via INTRAVENOUS

## 2023-12-17 MED ORDER — LACTATED RINGERS IV SOLN: INTRAVENOUS | Status: DC

## 2023-12-17 MED ORDER — OXYCODONE-ACETAMINOPHEN 5-325 MG PO TABS
1.0000 | ORAL_TABLET | Freq: Once | ORAL | Status: AC
  Administered 2023-12-17: 1 via ORAL
  Filled 2023-12-17: qty 1

## 2023-12-17 NOTE — Consult Note (Signed)
 NAME:  Courtney Mayer, MRN:  [id number], DOB:  [birth year], LOS: 0 ADMISSION DATE:  12/17/2023, CONSULTATION DATE:  12/17/2023 REFERRING MD:  Artist Kerns, MD, CHIEF COMPLAINT:  Cough   History of Present Illness:   Patient is a pleasant 53 year old female with a past medical history of left upper lobe lung cancer status post lobectomy and recurrent pneumonias who presents to the hospital with chest discomfort.  She has a history of chronic hypoxic respiratory failure maintained on 2 L of oxygen via nasal cannula with exertion and with sleep.  She is also an active smoker.  She has been having recurrent pneumonias over the past year followed by her outpatient pulmonologist at [facility] chest. She has required multiple courses of antibiotics (levofloxacin, azithromycin , augmentin ) as well as prednisone . She reports developing similar symptoms over the past few days with increased shortness of breath and cough productive of sputum.  She reached out to her outpatient providers and had a CT scan of the chest 2 days ago which was noted to have atelectasis of the right middle lobe with mucous plugging versus obstructing central neoplasm as well as patchy and nodular opacities throughout the right lower lobe as well as the left lung.  There was also significant emphysema. This morning, she woke up with chest discomfort and called her pulmonologist's office.  Given report of chest discomfort, she was asked to go to the ED.  In the ER, she is only reporting a cough and chest discomfort.  She has no wheezing at this point and her mentation is within normal.  She has not had any fevers, chills, or night sweats. Blood work showed mild leukocytosis with a white count of 11.2K.  Kidney function and the rest of electrolytes were reassuring.  Blood gas showed a normal pH of 7.38 with mild hypercapnia at 51.  The medical record also notable for admission to [facility] in May 2024 for toxic metabolic  encephalopathy due to acute hypoxic and hypercapnic respiratory failure secondary to pneumonia.  At that time, she was treated with broad-spectrum antibiotics. 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 She had an AVR (bioprosthetic)  Patient is an active smoker, currently smoking between 1.5 and 2 packs/day. 80 pack year of smoking history reported.   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 rate 13, SpO2 96%.       No intake or output data in the 24 hours ending 12/17/23 1831 There were no vitals filed for this visit.  Examination: Physical Exam Constitutional:      Appearance: Normal appearance.  Cardiovascular:     Rate and Rhythm: Normal rate and regular rhythm.     Pulses: Normal pulses.     Heart sounds: Normal heart sounds.  Pulmonary:     Effort: Pulmonary effort is normal.     Breath sounds: No wheezing.     Comments: Decreased breath sounds in both lung fields. No wheeze Neurological:     General: No focal deficit present.     Mental Status: She is alert and oriented to person, place, and time. Mental status is at baseline.  Assessment and Plan   #RML Collapse #Multi-nodular opacities #Lung Cancer s/p LULobectomy #COPD  53 year old female with a past medical history of COPD and lung cancer who presents to the hospital with increased shortness of breath and cough productive of sputum found to have right middle lobe collapse.  I have personally reviewed her chest CT and note the previous lobectomy. There is significant emphysema and nodular opacities with some  tree-in-bud scattered throughout her lungs. She also has RML collapse without some narrowing of the lateral segment of the right middle lobe (RB4) that was not present on the CT from 2 months ago. I suspect this is due to mucus plugging.   This constellation of symptoms (recurrent pneumonia, RML collapse) is concerning for non-tuberculous mycobacteria (NTM) such as MAC. This could also be due to H. Influenza or Moraxella given underlying lung disease. I do not suspect underlying lung malignancy, though given her history of lung cancer it is prudent to undergo a fiberoptic evaluation of the airway to rule out any endobronchial lesions.  I have discussed this with the patient and reviewed imaging at bedside. I would recommend flexible bronchoscopy in a non-urgent manner to evaluate the airway and obtain a BAL to send for culture (aerobic, anaerobic, fungal, AFB). I have discussed this with the patient and she reports that her current outpatient providers are out of network for her insurance and she is unable to undergo workup with them. We will help arrange for the appropriate outpatient follow up for her with our group.  I would recommend obtaining a CXR today to rule out pneumothorax, and initiate oral antibiotics without activity against MAC. Would recommend Augmentin  or Cefpodoxime. Would also send a short course of low dose prednisone  (20 mg) for 3 to 5 days.  Finally, patient has a history of COPD and is reportedly on airduo (ICS/LABA). She would ideally benefit from adding LAMA therapy to this regimen (such as Spiriva  Repimat) on discharge from ED. Once she is established in clinic we will consider repeating PFT's and consolidating her inhalers.  - continue Airduo - start Spiriva  respimat (2.5 mcg) two puffs once daily - CXR today - Augmentin  on discharge for a 7 day course - prednisone  20 mg for 3 to 5 days - outpatient bronchoscopy and follow up  -we will arrange - smoking cessation highly  encouraged today  Belva November, MD [facility] 12/17/2023 7:27 PM    Labs   CBC: Recent Labs  Lab 12/17/23 1500  WBC 11.2*  HGB 13.8  HCT 42.1  MCV 98.8  PLT 462*    Basic Metabolic Panel: Recent Labs  Lab 12/17/23 1500 12/17/23 1643  NA 138 138  K 4.2 4.3  CL 103 99  CO2 23 26  GLUCOSE 138* 86  BUN 13 13  CREATININE 0.89 1.08*  CALCIUM  9.2 9.8   GFR: CrCl cannot be calculated (Unknown ideal weight.). Recent Labs  Lab 12/17/23 1500 12/17/23 1643  WBC 11.2*  --   LATICACIDVEN  --  0.6    Liver Function Tests: Recent Labs  Lab 12/17/23 1643  AST 15  ALT 9  ALKPHOS 78  BILITOT 0.4  PROT 8.0  ALBUMIN 3.6   No results for input(s): LIPASE, AMYLASE in the last 168 hours. No results for input(s): AMMONIA in the last 168 hours.  ABG    Component Value Date/Time   HCO3 30.2 (H) 12/17/2023 1700   TCO2 24 01/15/2021 2110   O2SAT 78.4 12/17/2023 1700  Coagulation Profile: Recent Labs  Lab 12/17/23 1643  INR 1.0    Cardiac Enzymes: No results for input(s): CKTOTAL, CKMB, CKMBINDEX, TROPONINI in the last 168 hours.  HbA1C: Hgb A1c MFr Bld  Date/Time Value Ref Range Status  05/16/2021 06:12 AM 6.0 (H) 4.8 - 5.6 % Final    Comment:    (NOTE) Pre diabetes:          5.7%-6.4%  Diabetes:              >6.4%  Glycemic control for   <7.0% adults with diabetes     CBG: No results for input(s): GLUCAP in the last 168 hours.  Review of Systems:   Review of Systems  Constitutional:  Negative for chills and fever.  Respiratory:  Positive for cough, sputum production and shortness of breath. Negative for hemoptysis and wheezing.   Cardiovascular:  Positive for chest pain.     Past Medical History:  She,  has a past medical history of Back pain, Cancer (HCC), and COPD (chronic obstructive pulmonary disease) (HCC).   Surgical History:   Past Surgical History:  Procedure Laterality Date   atrial valve  replacement     BREAST SURGERY Right    cyst removal   BUBBLE STUDY  06/19/2021   Procedure: BUBBLE STUDY;  Surgeon: Alveta Aleene PARAS, MD;  Location: [facility] ENDOSCOPY;  Service: Cardiovascular;;   CESAREAN SECTION     LOBECTOMY     LUNG SURGERY Left    TEE WITHOUT CARDIOVERSION N/A 06/19/2021   Procedure: TRANSESOPHAGEAL ECHOCARDIOGRAM (TEE);  Surgeon: Alveta Aleene PARAS, MD;  Location: [facility] ENDOSCOPY;  Service: Cardiovascular;  Laterality: N/A;   WRIST FRACTURE SURGERY       Social History:   reports that she has been smoking cigarettes. She has never used smokeless tobacco. She reports that she does not currently use alcohol. She reports current drug use. Drug: Marijuana.   Family History:  Her family history includes Healthy in her father and mother.   Allergies Allergies  Allergen Reactions   Tramadol Palpitations    Can't take Ultram ER - palpitations  Regular Tramadol is fine to take Can't take Ultram ER - palpitations  Regular Tramadol is fine to take    Amoxicillin  Diarrhea   Compazine [Prochlorperazine] Other (See Comments)    Excitability   Compazine [Prochlorperazine] Anxiety     Home Medications  Prior to Admission medications   Medication Sig Start Date End Date Taking? Authorizing Provider  acyclovir (ZOVIRAX) 400 MG tablet Take 400 mg by mouth 2 (two) times daily as needed (Flair up).    [provider]  albuterol  (VENTOLIN  HFA) 108 (90 Base) MCG/ACT inhaler Inhale 2 puffs into the lungs every 6 (six) hours as needed for wheezing or shortness of breath.    [provider]  ALPRAZolam  (XANAX ) 0.25 MG tablet Take 1 mg by mouth daily as needed for anxiety or sleep. Patient not taking: Reported on 05/26/2023    [provider]  ALPRAZolam  (XANAX ) 0.5 MG tablet Take 0.5 mg by mouth 3 (three) times daily as needed for anxiety.    [provider]  amitriptyline  (ELAVIL ) 150 MG tablet Take 300 mg by mouth at bedtime. 12/31/20   [provider]  atorvastatin  (LIPITOR) 20 MG tablet Take 1 tablet (20 mg total) by mouth daily. 05/26/23   Onita Duos, MD  clopidogrel  (PLAVIX ) 75 MG tablet Take 1 tablet (75 mg total) by mouth daily. 06/26/23   Onita Duos, MD  docusate sodium (COLACE) 100 MG capsule Take 100 mg by mouth daily.    [provider]  gabapentin  (NEURONTIN ) 600 MG tablet Take 900 mg by mouth QID. 01/12/21   [provider]  LATUDA  60 MG TABS Take 60 mg by mouth at bedtime. 01/03/21   [provider]  methocarbamol (ROBAXIN) 750 MG tablet Take 1,500 mg by mouth 4 (four) times daily. Patient not taking: Reported on 05/26/2023 01/01/21   [provider]  metoprolol  tartrate (LOPRESSOR ) 100 MG tablet Take 1 tablet 2 hours prior to CT Scan. Patient not taking: Reported on 05/26/2023 06/25/21   Lesia Ozell Barter, PA-C  montelukast  (SINGULAIR ) 10 MG tablet Take 10 mg by mouth at bedtime.    [provider]  nicotine  (NICODERM CQ  - DOSED IN MG/24 HOURS) 21 mg/24hr patch Place 1 patch (21 mg total) onto the skin daily. Patient not taking: Reported on 05/26/2023 05/18/21   Singh, Prashant K, MD  oxyCODONE -acetaminophen  (PERCOCET) 10-325 MG tablet Take 1 tablet by mouth 4 (four) times daily as needed for pain. 01/15/21   [provider]  OXYCONTIN  30 MG 12 hr tablet Take 30 tablets by mouth 2 (two) times daily. Patient not taking: Reported on 05/26/2023 01/15/21   [provider]  pantoprazole  (PROTONIX ) 40 MG tablet Take 40 mg by mouth daily. 01/14/21   [provider]  promethazine (PHENERGAN) 25 MG tablet Take 25 mg by mouth every 6 (six) hours as needed for nausea or vomiting.    [provider]  QUEtiapine  (SEROQUEL ) 50 MG tablet Take 100 mg by mouth at bedtime. 05/13/21   [provider]  TRELEGY ELLIPTA 100-62.5-25 MCG/INH AEPB Inhale 1 puff into the lungs daily as needed (Chest tightness and cough). 01/15/21   [provider]     I  spent 80 minutes caring for this patient today, including preparing to see the patient, obtaining a medical history , reviewing a separately obtained history, performing a medically appropriate examination and/or evaluation, counseling and educating the patient/family/caregiver, ordering medications, tests, or procedures, documenting clinical information in the electronic health record, and independently interpreting results (not separately reported/billed) and communicating results to the patient/family/caregiver

## 2023-12-17 NOTE — ED Notes (Signed)
 Patient to imaging.

## 2023-12-17 NOTE — Progress Notes (Signed)
 Please schedule the following:  Provider performing procedure:Rosmarie Esquibel Diagnosis: RML collapse Which side if for nodule / mass? bilateral Procedure: flexible bronchoscopy  Has patient been spoken to by Provider and given informed consent? yes Anesthesia: general Do you need Fluro? no Duration of procedure: 30 minutes Date: 12/22/2023 Location: [facility] OR Does patient have OSA? no DM? no Or Latex allergy? no Medication Restriction/ Anticoagulate/Antiplatelet: no Pre-op Labs Ordered:determined by Anesthesia Imaging request: no  (If, [facility] CT Chest, please have STAT courier sent to ENDO)

## 2023-12-17 NOTE — ED Triage Notes (Addendum)
 Pt to ED via POV from home. Pt reports was sent for outpt CT after concern for PNA. Pt reports increased SOB and congestion. Pt reports mid back pain. Pt is seen by [facility] pulmonologist. Pt uses oxygen PRN. Pt smoke 1.5-2 packs of cigarettes   Pt with hx of lobectomy and heart valve replacement.

## 2023-12-17 NOTE — ED Provider Notes (Signed)
 [facility] Provider Note   Event Date/Time   First MD Initiated Contact with Patient 12/17/23 1600     (approximate) History  Abnormal CT   HPI Courtney Mayer is a 53 y.o. female with a history of COPD, persistent tobacco abuse, and non-small cell lung cancer status post lung resection and currently in remission who presents after being called by her pulmonologist due to an abnormal CT.  CT findings showed collapse of the right middle lobe as well as pneumonia in the right lower lobe.  Patient does endorse generalized weakness, productive cough, and increasing shortness of breath.  Patient also reports mid right chest pain that radiates through to her back and is new since feeling the symptoms. ROS: Patient currently denies any vision changes, tinnitus, difficulty speaking, facial droop, sore throat, abdominal pain, nausea/vomiting/diarrhea, dysuria, or weakness/numbness/paresthesias in any extremity   Physical Exam  Triage Vital Signs: ED Triage Vitals  Encounter Vitals Group     BP 12/17/23 1450 109/74     Girls Systolic BP Percentile --      Girls Diastolic BP Percentile --      Boys Systolic BP Percentile --      Boys Diastolic BP Percentile --      Pulse Rate 12/17/23 1450 94     Resp 12/17/23 1450 18     Temp 12/17/23 1450 98 F (36.7 C)     Temp Source 12/17/23 1450 Oral     SpO2 12/17/23 1450 95 %     Weight --      Height --      Head Circumference --      Peak Flow --      Pain Score 12/17/23 1451 4     Pain Loc --      Pain Education --      Exclude from Growth Chart --    Most recent vital signs: Vitals:   12/17/23 1655 12/17/23 1930  BP: 112/78 (!) 156/116  Pulse: 80 77  Resp: 13 16  Temp:    SpO2: 96% 91%   General: Awake, oriented x4. CV:  Good peripheral perfusion.  No MGR Resp:  Normal effort.  Decreased lung sounds over right middle and lower lobe Abd:  No distention. Other:  [place] well-developed, well-nourished Caucasian  female resting comfortably in no acute distress ED Results / Procedures / Treatments  Labs (all labs ordered are listed, but only abnormal results are displayed) Labs Reviewed  BASIC METABOLIC PANEL WITH GFR - Abnormal; Notable for the following components:      Result Value   Glucose, Bld 138 (*)    All other components within normal limits  CBC - Abnormal; Notable for the following components:   WBC 11.2 (*)    Platelets 462 (*)    All other components within normal limits  COMPREHENSIVE METABOLIC PANEL WITH GFR - Abnormal; Notable for the following components:   Creatinine, Ser 1.08 (*)    All other components within normal limits  BLOOD GAS, VENOUS - Abnormal; Notable for the following components:   Bicarbonate 30.2 (*)    Acid-Base Excess 3.9 (*)    All other components within normal limits  RESP PANEL BY RT-PCR (RSV, FLU A&B, COVID)  RVPGX2  CULTURE, BLOOD (ROUTINE X 2)  CULTURE, BLOOD (ROUTINE X 2)  LACTIC ACID, PLASMA  LACTIC ACID, PLASMA  PROTIME-INR  TROPONIN I (HIGH SENSITIVITY)  TROPONIN I (HIGH SENSITIVITY)   EKG ED ECG REPORT I, Artist  MARLA Kerns, the attending physician, personally viewed and interpreted this ECG. Date: 12/17/2023 EKG Time: 1456 Rate: 91 Rhythm: normal sinus rhythm QRS Axis: normal Intervals: normal ST/T Wave abnormalities: normal Narrative Interpretation: no evidence of acute ischemia RADIOLOGY ED MD interpretation: 2 view chest x-ray independently interpreted and shows no signs of pneumothorax.  There are opacities throughout the right mid and lower lung fields concerning for pneumonia - All radiology independently interpreted and agree with radiology assessment Official radiology report(s): No results found. PROCEDURES: Critical Care performed: No Procedures MEDICATIONS ORDERED IN ED: Medications  lactated ringers  infusion ( Intravenous New Bag/Given 12/17/23 1739)  nicotine  (NICODERM CQ  - dosed in mg/24 hours) patch 21 mg (21 mg  Transdermal Patch Applied 12/17/23 1703)  lactated ringers  bolus 1,000 mL (0 mLs Intravenous Stopped 12/17/23 1739)  cefTRIAXone  (ROCEPHIN ) 2 g in sodium chloride  0.9 % 100 mL IVPB (0 g Intravenous Stopped 12/17/23 1737)  azithromycin  (ZITHROMAX ) 500 mg in sodium chloride  0.9 % 250 mL IVPB (0 mg Intravenous Stopped 12/17/23 1849)  lactated ringers  bolus 500 mL (0 mLs Intravenous Stopped 12/17/23 1849)  fentaNYL  (SUBLIMAZE ) injection 50 mcg (50 mcg Intravenous Given 12/17/23 1705)  oxyCODONE -acetaminophen  (PERCOCET/ROXICET) 5-325 MG per tablet 1 tablet (1 tablet Oral Given 12/17/23 1704)   IMPRESSION / MDM / ASSESSMENT AND PLAN / ED COURSE  I reviewed the triage vital signs and the nursing notes.                             The patient is on the cardiac monitor to evaluate for evidence of arrhythmia and/or significant heart rate changes. Patient's presentation is most consistent with acute presentation with potential threat to life or bodily function. Patient's 53 year old female with the above-stated past medical history who presents after being called by her pulmonologist after a CT scan concerning for right-sided multifocal pneumonia.  Patient does endorse worsening shortness of breath as well as right-sided chest pain. DDx: Pneumonia, PE, recurrent neoplasm, mucous plugging Plan: CBC, CMP, lactic acid, repeat chest x-ray, empiric antibiotics, fluid resuscitation  Results: CBC shows mild leukocytosis to 11, no lactic acidosis, repeat chest x-ray unchanged Clinical Course as of 12/17/23 1936  Thu Dec 17, 2023  1935 I spoke to pulmonology, Dr. Isadora, who is seen and assessed this patient and agrees with plan for discharge home at this time with follow-up for bronchoscopy.  Recommends a 7-day course of Augmentin  as well as a short course of low-dose prednisone  (20 mg daily for the next 4 days).  Discussed this plan with the patient at bedside who agrees with plan as well as all findings discussed. Rx:  Augmentin , prednisone  Dispo: Discharge home with pulmonology follow-up [EB]    Clinical Course User Index [EB] Kerns Artist MARLA, MD   FINAL CLINICAL IMPRESSION(S) / ED DIAGNOSES   Final diagnoses:  Pneumonia of right lower lobe due to infectious organism  Pneumonia of right middle lobe due to infectious organism  Right-sided chest pain   Rx / DC Orders   ED Discharge Orders          Ordered    Ambulatory referral to Pulmonology       Comments: bronchoscopy   12/17/23 1852    amoxicillin -clavulanate (AUGMENTIN ) 875-125 MG tablet  2 times daily        12/17/23 1853    predniSONE  (DELTASONE ) 20 MG tablet  Daily        12/17/23 1853  Note:  This document was prepared using Dragon voice recognition software and may include unintentional dictation errors.   Jossie Artist POUR, MD 12/17/23 2010

## 2023-12-17 NOTE — Progress Notes (Signed)
 CODE SEPSIS - PHARMACY COMMUNICATION  **Broad Spectrum Antibiotics should be administered within 1 hour of Sepsis diagnosis**  Time Code Sepsis Called/Page Received: 1628  Antibiotics Ordered: Azithromycin , Ceftriaxone    Time of 1st antibiotic administration: 1651  Additional action taken by pharmacy: N/A  If necessary, Name of Provider/Nurse Contacted: N/A  Estill CHRISTELLA Lutes, PharmD, BCPS Clinical Pharmacist 12/17/2023 4:39 PM

## 2023-12-17 NOTE — Sepsis Progress Note (Signed)
Sepsis protocol monitored by elink

## 2023-12-18 ENCOUNTER — Telehealth: Payer: Self-pay

## 2023-12-18 NOTE — Progress Notes (Signed)
NA. No voicemail.  

## 2023-12-18 NOTE — Progress Notes (Signed)
 Patient advised. NFN.

## 2023-12-18 NOTE — Telephone Encounter (Signed)
 Patent is aware of date and time. Bronch email has been sent.

## 2023-12-18 NOTE — Telephone Encounter (Signed)
 For the code 68377 Prior Auth Not Required Refer # [id number]

## 2023-12-18 NOTE — Telephone Encounter (Signed)
 Noted. Nothing further needed.

## 2023-12-18 NOTE — Telephone Encounter (Signed)
 Bronchoscopy-Flex 12/22/2023 9:00 am Dx: R91.1 CPT code 68377  [place]ell please see Bronch info.

## 2023-12-19 ENCOUNTER — Encounter: Payer: Self-pay | Admitting: Urgent Care

## 2023-12-21 ENCOUNTER — Encounter
Admission: RE | Admit: 2023-12-21 | Discharge: 2023-12-21 | Disposition: A | Discharge status: Home / Self Care | Source: Ambulatory Visit | Attending: Student in an Organized Health Care Education/Training Program | Admitting: Student in an Organized Health Care Education/Training Program

## 2023-12-21 HISTORY — DX: Cerebral infarction due to unspecified occlusion or stenosis of left middle cerebral artery: I63.512

## 2023-12-21 NOTE — Patient Instructions (Addendum)
 Your procedure is scheduled on:   TUESDAY AUGUST 26  Report to the Registration Desk on the 1st floor of the [facility] To find out your arrival time, please call [phone] between 1PM - 3PM on:  MONDAY AUGUST 25  If your arrival time is 6:00 am, do not arrive before that time as the [facility] entrance doors do not open until 6:00 am.  REMEMBER: Instructions that are not followed completely may result in serious medical risk, up to and including death; or upon the discretion of your surgeon and anesthesiologist your surgery may need to be rescheduled.  Do not eat food after midnight the night before surgery.  No gum chewing or hard candies.  One week prior to surgery: Stop Anti-inflammatories (NSAIDS) such as Advil, Aleve, Ibuprofen, Motrin, Naproxen, Naprosyn and Aspirin  based products such as Excedrin, Goody's Powder, BC Powder. Stop ANY OVER THE COUNTER supplements until after surgery.  You may however, continue to take Tylenol  if needed for pain up until the day of surgery.  **Follow recommendations regarding stopping blood thinners.** clopidogrel  (PLAVIX )  Continue taking all of your other prescription medications up until the day of surgery.  ON THE DAY OF SURGERY ONLY TAKE THESE MEDICATIONS WITH SIPS OF WATER:  ALPRAZolam  (XANAX )   Use inhalers on the day of surgery and bring to the hospital. albuterol  (VENTOLIN  HFA)   No Alcohol for 24 hours before or after surgery.  No Smoking including e-cigarettes for 24 hours before surgery.  No nicotine  patches on the day of surgery.  Do not use any recreational drugs for at least a week (preferably 2 weeks) before your surgery.  Please be advised that the combination of cocaine and anesthesia may have negative outcomes, up to and including death. If you test positive for cocaine, your surgery will be cancelled.  On the morning of surgery brush your teeth with toothpaste and water, you may rinse your mouth with mouthwash  if you wish. Do not swallow any toothpaste or mouthwash.  Use CHG Soap or wipes as directed on instruction sheet.  Do not wear jewelry, make-up, hairpins, clips or nail polish.  For welded (permanent) jewelry: bracelets, anklets, waist bands, etc.  Please have this removed prior to surgery.  If it is not removed, there is a chance that hospital personnel will need to cut it off on the day of surgery.  Do not wear lotions, powders, or perfumes.   Do not shave body hair from the neck down 48 hours before surgery.  Contact lenses, hearing aids and dentures may not be worn into surgery.  Do not bring valuables to the hospital. [facility] is not responsible for any missing/lost belongings or valuables.   Notify your doctor if there is any change in your medical condition (cold, fever, infection).  Wear comfortable clothing (specific to your surgery type) to the hospital.  After surgery, you can help prevent lung complications by doing breathing exercises.  Take deep breaths and cough every 1-2 hours.   If you are being discharged the day of surgery, you will not be allowed to drive home. You will need a responsible individual to drive you home and stay with you for 24 hours after surgery.   If you are taking public transportation, you will need to have a responsible individual with you.  Please call the Pre-admissions Testing Dept. at [phone] if you have any questions about these instructions.  Surgery Visitation Policy:  Patients having surgery or a procedure  may have two visitors.  Children under the age of 55 must have an adult with them who is not the patient.  Merchandiser, retail to address health-related social needs:  [email or web address]

## 2023-12-21 NOTE — Telephone Encounter (Signed)
 Per secure chat from Pre-Admit- Took plavix  last night. Bronch tomorrow.   I spoke with the patient and she took her plaix on Saturday (2 days ago).   Per Dr. Isadora- he would like to cancel the procedure tomorrow and see her in the office this week or next week.  I spoke with the patient and scheduled her an appt for 9/2 at 1:30pm. And I have canceled the procedure.  Nothing further needed.

## 2023-12-22 ENCOUNTER — Encounter: Admission: RE | Payer: Self-pay | Source: Home / Self Care

## 2023-12-22 ENCOUNTER — Ambulatory Visit
Admission: RE | Admit: 2023-12-22 | Source: Home / Self Care | Admitting: Student in an Organized Health Care Education/Training Program

## 2023-12-22 DIAGNOSIS — J189 Pneumonia, unspecified organism: Secondary | ICD-10-CM | POA: Insufficient documentation

## 2023-12-22 LAB — CULTURE, BLOOD (ROUTINE X 2)
Culture: NO GROWTH
Culture: NO GROWTH
Special Requests: ADEQUATE

## 2023-12-22 SURGERY — BRONCHOSCOPY, FLEXIBLE
Anesthesia: General | Laterality: Bilateral

## 2023-12-29 ENCOUNTER — Encounter: Payer: Self-pay | Admitting: Student in an Organized Health Care Education/Training Program

## 2023-12-29 ENCOUNTER — Ambulatory Visit (INDEPENDENT_AMBULATORY_CARE_PROVIDER_SITE_OTHER): Admitting: Student in an Organized Health Care Education/Training Program

## 2023-12-29 ENCOUNTER — Telehealth: Payer: Self-pay

## 2023-12-29 VITALS — BP 100/60 | HR 89 | Temp 97.5°F | Ht 65.0 in | Wt 113.0 lb

## 2023-12-29 DIAGNOSIS — J42 Unspecified chronic bronchitis: Secondary | ICD-10-CM | POA: Diagnosis not present

## 2023-12-29 DIAGNOSIS — F17210 Nicotine dependence, cigarettes, uncomplicated: Secondary | ICD-10-CM

## 2023-12-29 DIAGNOSIS — J189 Pneumonia, unspecified organism: Secondary | ICD-10-CM

## 2023-12-29 DIAGNOSIS — F17200 Nicotine dependence, unspecified, uncomplicated: Secondary | ICD-10-CM

## 2023-12-29 MED ORDER — NICOTINE 21 MG/24HR TD PT24: 21.0000 mg | MEDICATED_PATCH | TRANSDERMAL | 0 refills | Status: AC

## 2023-12-29 MED ORDER — MONTELUKAST SODIUM 10 MG PO TABS: 10.0000 mg | ORAL_TABLET | Freq: Every day | ORAL | 12 refills | Status: AC

## 2023-12-29 MED ORDER — NICOTINE POLACRILEX 2 MG MT LOZG: 2.0000 mg | LOZENGE | OROMUCOSAL | 3 refills | Status: AC | PRN

## 2023-12-29 MED ORDER — SPIRIVA RESPIMAT 2.5 MCG/ACT IN AERS: 2.0000 | INHALATION_SPRAY | Freq: Every day | RESPIRATORY_TRACT | 12 refills | Status: DC

## 2023-12-29 MED ORDER — NICOTINE 7 MG/24HR TD PT24: 7.0000 mg | MEDICATED_PATCH | TRANSDERMAL | 0 refills | Status: AC

## 2023-12-29 MED ORDER — NICOTINE 14 MG/24HR TD PT24: 14.0000 mg | MEDICATED_PATCH | TRANSDERMAL | 0 refills | Status: AC

## 2023-12-29 MED ORDER — BUDESONIDE-FORMOTEROL FUMARATE 160-4.5 MCG/ACT IN AERO: 2.0000 | INHALATION_SPRAY | Freq: Two times a day (BID) | RESPIRATORY_TRACT | 12 refills | Status: DC

## 2023-12-29 NOTE — H&P (View-Only) (Signed)
 Assessment & Plan:   #Chronic bronchitis, unspecified chronic bronchitis type (HCC) (Primary)  Patient has long standing history of smoking, and was given a diagnosis of COPD based on spirometry a few years back. Will update her PFT's with spirometry, lung volumes, and DLCO. I will also initiate ICS/LABA (Symbicort ) and LAMA (Spiriva  Respimat) for management of her COPD. Max eosinophil count was 400 in 2022. She did receive a prescription for Montelukast  by her previous providers which I will resume for now, and consider stopping on follow up depending on the findings from her PFT's.  - budesonide -formoterol  (SYMBICORT ) 160-4.5 MCG/ACT inhaler; Inhale 2 puffs into the lungs in the morning and at bedtime.  Dispense: 1 each; Refill: 12 - Tiotropium Bromide  Monohydrate (SPIRIVA  RESPIMAT) 2.5 MCG/ACT AERS; Inhale 2 puffs into the lungs daily.  Dispense: 60 each; Refill: 12 - Pulmonary Function Test; Future - montelukast  (SINGULAIR ) 10 MG tablet; Take 1 tablet (10 mg total) by mouth at bedtime.  Dispense: 30 tablet; Refill: 12  #Tobacco use disorder  Counseled at length regarding smoking cessation. She is willing to attempt to quit with nicotine  patches and lozenges.  - nicotine  (NICODERM CQ  - DOSED IN MG/24 HOURS) 14 mg/24hr patch; Place 1 patch (14 mg total) onto the skin daily for 14 days.  Dispense: 14 patch; Refill: 0 - nicotine  (NICODERM CQ  - DOSED IN MG/24 HOURS) 21 mg/24hr patch; Place 1 patch (21 mg total) onto the skin daily.  Dispense: 42 patch; Refill: 0 - nicotine  (NICODERM CQ  - DOSED IN MG/24 HR) 7 mg/24hr patch; Place 1 patch (7 mg total) onto the skin daily for 14 days.  Dispense: 14 patch; Refill: 0 - nicotine  polacrilex (NICOTINE  MINI) 2 MG lozenge; Take 1 lozenge (2 mg total) by mouth every 2 (two) hours as needed for smoking cessation.  Dispense: 72 lozenge; Refill: 3  #Pneumonia of right middle lobe due to infectious organism  She was noted to have RML collapse on the most  recent chest CT. On my review, the RML is collapsed with some narrowing in the lateral segment of the RML (RB4). This was not present on a previous CT 2 months ago. I suspect this is due to mucus plugging. She does have a history of lung malignancy and a fiber optic evaluation of the airway is indicated to rule out any recurrence. It is also important to obtain a BAL from the RML as a right middle lobe syndrome/collapse could be caused by NTM such as MAC or other bacteria known to cause disease in COPD'ers (such as H. Influenza or Moraxella). Will plan for flexible bronchoscopy next week with a planned 5 day plavix  washout.  - Procedural/ Surgical Case Request: BRONCHOSCOPY, FLEXIBLE; Future - hold clopidogrel  for 5 days prior to bronchoscopy   Return in about 5 weeks (around 02/02/2024).  I spent 44 minutes caring for this patient today, including preparing to see the patient, obtaining a medical history , reviewing a separately obtained history, performing a medically appropriate examination and/or evaluation, counseling and educating the patient/family/caregiver, ordering medications, tests, or procedures, documenting clinical information in the electronic health record, and independently interpreting results (not separately reported/billed) and communicating results to the patient/family/caregiver. 3 minutes utilized during today's visit to counsel the patient regarding the importance and strategies of smoking cessation.  Belva November, MD [facility]   End of visit medications:  Meds ordered this encounter  Medications   budesonide -formoterol  (SYMBICORT ) 160-4.5 MCG/ACT inhaler    Sig: Inhale 2  puffs into the lungs in the morning and at bedtime.    Dispense:  1 each    Refill:  12   Tiotropium Bromide  Monohydrate (SPIRIVA  RESPIMAT) 2.5 MCG/ACT AERS    Sig: Inhale 2 puffs into the lungs daily.    Dispense:  60 each    Refill:  12   montelukast  (SINGULAIR ) 10 MG tablet     Sig: Take 1 tablet (10 mg total) by mouth at bedtime.    Dispense:  30 tablet    Refill:  12   nicotine  (NICODERM CQ  - DOSED IN MG/24 HOURS) 14 mg/24hr patch    Sig: Place 1 patch (14 mg total) onto the skin daily for 14 days.    Dispense:  14 patch    Refill:  0   nicotine  (NICODERM CQ  - DOSED IN MG/24 HOURS) 21 mg/24hr patch    Sig: Place 1 patch (21 mg total) onto the skin daily.    Dispense:  42 patch    Refill:  0   nicotine  (NICODERM CQ  - DOSED IN MG/24 HR) 7 mg/24hr patch    Sig: Place 1 patch (7 mg total) onto the skin daily for 14 days.    Dispense:  14 patch    Refill:  0   nicotine  polacrilex (NICOTINE  MINI) 2 MG lozenge    Sig: Take 1 lozenge (2 mg total) by mouth every 2 (two) hours as needed for smoking cessation.    Dispense:  72 lozenge    Refill:  3     Current Outpatient Medications:    acyclovir (ZOVIRAX) 400 MG tablet, Take 400 mg by mouth 2 (two) times daily as needed (Flair up)., Disp: , Rfl:    albuterol  (VENTOLIN  HFA) 108 (90 Base) MCG/ACT inhaler, Inhale 2 puffs into the lungs every 6 (six) hours as needed for wheezing or shortness of breath., Disp: , Rfl:    ALPRAZolam  (XANAX ) 0.5 MG tablet, Take 0.5 mg by mouth 3 (three) times daily as needed for anxiety., Disp: , Rfl:    amitriptyline  (ELAVIL ) 150 MG tablet, Take 300 mg by mouth at bedtime., Disp: , Rfl:    atorvastatin  (LIPITOR) 20 MG tablet, Take 1 tablet (20 mg total) by mouth daily., Disp: 90 tablet, Rfl: 3   budesonide -formoterol  (SYMBICORT ) 160-4.5 MCG/ACT inhaler, Inhale 2 puffs into the lungs in the morning and at bedtime., Disp: 1 each, Rfl: 12   clopidogrel  (PLAVIX ) 75 MG tablet, Take 1 tablet (75 mg total) by mouth daily., Disp: 90 tablet, Rfl: 3   docusate sodium (COLACE) 100 MG capsule, Take 100 mg by mouth daily., Disp: , Rfl:    gabapentin  (NEURONTIN ) 600 MG tablet, Take 900 mg by mouth QID., Disp: , Rfl:    LATUDA  60 MG TABS, Take 60 mg by mouth at bedtime., Disp: , Rfl:    nicotine   (NICODERM CQ  - DOSED IN MG/24 HOURS) 14 mg/24hr patch, Place 1 patch (14 mg total) onto the skin daily for 14 days., Disp: 14 patch, Rfl: 0   nicotine  (NICODERM CQ  - DOSED IN MG/24 HOURS) 21 mg/24hr patch, Place 1 patch (21 mg total) onto the skin daily., Disp: 42 patch, Rfl: 0   nicotine  (NICODERM CQ  - DOSED IN MG/24 HR) 7 mg/24hr patch, Place 1 patch (7 mg total) onto the skin daily for 14 days., Disp: 14 patch, Rfl: 0   nicotine  polacrilex (NICOTINE  MINI) 2 MG lozenge, Take 1 lozenge (2 mg total) by mouth every 2 (two) hours as needed for  smoking cessation., Disp: 72 lozenge, Rfl: 3   oxyCODONE -acetaminophen  (PERCOCET) 10-325 MG tablet, Take 1 tablet by mouth every 6 (six) hours as needed., Disp: , Rfl:    oxymorphone (OPANA) 10 MG tablet, Take 10 mg by mouth in the morning and at bedtime., Disp: , Rfl:    pantoprazole  (PROTONIX ) 40 MG tablet, Take 40 mg by mouth daily., Disp: , Rfl:    Tiotropium Bromide  Monohydrate (SPIRIVA  RESPIMAT) 2.5 MCG/ACT AERS, Inhale 2 puffs into the lungs daily., Disp: 60 each, Rfl: 12   zolpidem (AMBIEN) 10 MG tablet, Take 10 mg by mouth daily., Disp: , Rfl:    montelukast  (SINGULAIR ) 10 MG tablet, Take 1 tablet (10 mg total) by mouth at bedtime., Disp: 30 tablet, Rfl: 12   Subjective:   PATIENT ID: Courtney Mayer GENDER: female DOB: 21-Jul-1970, MRN: [id number]  Chief Complaint  Patient presents with   Lung Mass    No SOB, wheezing or cough.     HPI  Patient is a pleasant 53 year old female with a past medical history of left upper lobe lung cancer status post lobectomy and recurrent pneumonias who was recently seen by me in the hospital in consultation for finding of RML collapse on an outpatient chest CT.   She has a history of chronic hypoxic respiratory failure maintained on 2 L of oxygen via nasal cannula with exertion and with sleep.  She is also an active smoker.  She has been having recurrent pneumonias over the past year followed by her outpatient  pulmonologist at [facility]. She has required multiple courses of antibiotics (levofloxacin, azithromycin , augmentin ) as well as prednisone .  She developed symptoms in August similar to her previous infections, with increased shortness of breath and productive cough. She reached out to her outpatient providers and a repeat chest CT was ordered. CT showed atelectasis of the right middle lobe with mucous plugging versus obstructing central neoplasm as well as patchy and nodular opacities throughout the right lower lobe as well as the left lung.  There was also significant emphysema.   She was seen in the ED on 12/17/2023: She was at baseline and workup was only notable for leukocytosis with a white count of 11.2K. blood gas showed normal pH at 7.38 with mild hypercapnia at 51. She was discharged with a course of antibiotics and short course of prednisone . Plan was to undergo flexible bronchoscopy outpatient the following week but this was cancelled given clopidogrel  was not held.  Return Visit 12/31/2023:  She presents for follow up, and feels well at baseline. She has relapsed and currently smoking 1.5 packs a day. She has not had any change in symptoms, with no increase in shortness of breath, cough, or sputum production. She is not using her home inhalers.   The medical record also notable for admission to [facility] in May 2024 for toxic metabolic encephalopathy due to acute hypoxic and hypercapnic respiratory failure secondary to pneumonia.  At that time, she was treated with broad-spectrum antibiotics. 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 She had an AVR (bioprosthetic)   Patient is an active smoker, currently smoking between 1.5 and 2 packs/day. 80 pack year of smoking history reported.  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    Review of Systems  Constitutional:  Negative for chills, fever and weight loss.  Respiratory:  Positive for cough and shortness of breath. Negative for hemoptysis and wheezing.   Cardiovascular:  Negative for chest pain.     Objective:   Vitals:   12/29/23 1332  BP: 100/60  Pulse: 89  Temp: (!) 97.5 F (36.4 C)  SpO2: 92%  Weight: 113 lb (51.3 kg)  Height: 5' 5 (1.651 m)   92% on RA BMI Readings from Last 3 Encounters:  12/29/23 18.80 kg/m  12/21/23 19.14 kg/m  05/26/23 19.14 kg/m   Wt Readings from Last 3 Encounters:  12/29/23 113 lb (51.3 kg)  12/21/23 115 lb (52.2 kg)  05/26/23 115 lb (52.2 kg)    Physical Exam Constitutional:      Appearance: Normal appearance.  Cardiovascular:     Rate and Rhythm: Normal rate and regular rhythm.     Pulses: Normal pulses.     Heart sounds: Normal heart sounds.  Pulmonary:     Effort: Pulmonary effort is normal.     Breath sounds: No wheezing.     Comments: Decreased air entry bilaterally Neurological:     General: No focal deficit present.     Mental Status: She is alert and oriented to person, place, and time. Mental status is at baseline.       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   Cerebrovascular accident (CVA) due to occlusion of left middle cerebral artery (HCC)    COPD (chronic obstructive pulmonary disease) (HCC)    Pneumonia of right middle lobe due to infectious organism 12/17/2023     Family  History  Problem Relation Age of Onset   Healthy Mother    Healthy Father      Past Surgical History:  Procedure Laterality Date   atrial valve replacement     BREAST SURGERY Right    cyst removal   BUBBLE STUDY  06/19/2021   Procedure: BUBBLE STUDY;  Surgeon: Alveta Aleene PARAS, MD;  Location: [facility] ENDOSCOPY;  Service: Cardiovascular;;   CESAREAN SECTION     LOBECTOMY     LUNG SURGERY Left    TEE WITHOUT CARDIOVERSION N/A 06/19/2021   Procedure: TRANSESOPHAGEAL ECHOCARDIOGRAM (TEE);  Surgeon: Alveta Aleene PARAS, MD;  Location: [facility] ENDOSCOPY;  Service: Cardiovascular;  Laterality: N/A;   WRIST FRACTURE SURGERY      Social History   Socioeconomic History   Marital status: Single    Spouse name: Not on file   Number of children: Not on file   Years of education: Not on file   Highest education level: Not on file  Occupational History   Not on file  Tobacco Use   Smoking status: Every Day    Current packs/day: 1.50    Types: Cigarettes   Smokeless tobacco: Never   Tobacco comments:    Smokes 1.5 PPD- khj 12/29/2023        Started smoking at 53 yrs old    Smoked 2PPD at her heaviest  Vaping Use   Vaping status: Every Day   Substances: Nicotine   Substance and Sexual Activity   Alcohol use: Not  Currently   Drug use: Yes    Types: Marijuana   Sexual activity: Not on file  Other Topics Concern   Not on file  Social History Narrative   Right handed   Caffiene none   Lives with mom,    widowed   Social Drivers of Health   Financial Resource Strain: Low Risk  (08/14/2021)   Received from [facility]   Overall Financial Resource Strain (CARDIA)    Difficulty of Paying Living Expenses: Not hard at all  Food Insecurity: No Food Insecurity (09/18/2022)   Received from [facility]   Hunger Vital Sign    Within the past 12 months, you worried that your food would run out before you got the money to buy more.: Never true    Within the past 12 months, the food you bought just  didn't last and you didn't have money to get more.: Never true  Transportation Needs: No Transportation Needs (09/18/2022)   Received from [facility] - Transportation    Lack of Transportation (Medical): No    Lack of Transportation (Non-Medical): No  Physical Activity: Unknown (08/04/2021)   Received from [facility]   Exercise Vital Sign    On average, how many days per week do you engage in moderate to strenuous exercise (like a brisk walk)?: 0 days    Minutes of Exercise per Session: Not on file  Stress: Stress Concern Present (09/18/2022)   Received from [facility] - Occupational Stress Questionnaire    Feeling of Stress : To some extent  Social Connections: Somewhat Isolated (08/04/2021)   Received from [facility]   Social Network    How would you rate your social network (family, work, friends)?: Restricted participation with some degree of social isolation  Intimate Partner Violence: Not At Risk (09/18/2022)   Received from [facility]   HITS    Over the last 12 months how often did your partner physically hurt you?: Never    Over the last 12 months how often did your partner insult you or talk down to you?: Never    Over the last 12 months how often did your partner threaten you with physical harm?: Never    Over the last 12 months how often did your partner scream or curse at you?: Never     Allergies  Allergen Reactions   Tramadol Palpitations    Can't take Ultram ER - palpitations  Regular Tramadol is fine to take Can't take Ultram ER - palpitations  Regular Tramadol is fine to take    Amoxicillin  Diarrhea   Compazine [Prochlorperazine] Other (See Comments)    Excitability   Compazine [Prochlorperazine] Anxiety     CBC    Component Value Date/Time   WBC 11.2 (H) 12/17/2023 1500   RBC 4.26 12/17/2023 1500   HGB 13.8 12/17/2023 1500   HGB 12.7 05/30/2021 1145   HCT 42.1 12/17/2023 1500   HCT 36.8  05/30/2021 1145   PLT 462 (H) 12/17/2023 1500   PLT 479 (H) 05/30/2021 1145   MCV 98.8 12/17/2023 1500   MCV 95 05/30/2021 1145   MCH 32.4 12/17/2023 1500   MCHC 32.8 12/17/2023 1500   RDW 14.6 12/17/2023 1500   RDW 12.6 05/30/2021 1145   LYMPHSABS 3.7 05/17/2021 0139   MONOABS 0.6 05/17/2021 0139   EOSABS 0.0 05/17/2021 0139   BASOSABS 0.0 05/17/2021 0139    Pulmonary Functions Testing Results:  No data to display          Outpatient Medications Prior to Visit  Medication Sig Dispense Refill   acyclovir (ZOVIRAX) 400 MG tablet Take 400 mg by mouth 2 (two) times daily as needed (Flair up).     albuterol  (VENTOLIN  HFA) 108 (90 Base) MCG/ACT inhaler Inhale 2 puffs into the lungs every 6 (six) hours as needed for wheezing or shortness of breath.     ALPRAZolam  (XANAX ) 0.5 MG tablet Take 0.5 mg by mouth 3 (three) times daily as needed for anxiety.     amitriptyline  (ELAVIL ) 150 MG tablet Take 300 mg by mouth at bedtime.     atorvastatin  (LIPITOR) 20 MG tablet Take 1 tablet (20 mg total) by mouth daily. 90 tablet 3   clopidogrel  (PLAVIX ) 75 MG tablet Take 1 tablet (75 mg total) by mouth daily. 90 tablet 3   docusate sodium (COLACE) 100 MG capsule Take 100 mg by mouth daily.     gabapentin  (NEURONTIN ) 600 MG tablet Take 900 mg by mouth QID.     LATUDA  60 MG TABS Take 60 mg by mouth at bedtime.     oxyCODONE -acetaminophen  (PERCOCET) 10-325 MG tablet Take 1 tablet by mouth every 6 (six) hours as needed.     oxymorphone (OPANA) 10 MG tablet Take 10 mg by mouth in the morning and at bedtime.     pantoprazole  (PROTONIX ) 40 MG tablet Take 40 mg by mouth daily.     zolpidem (AMBIEN) 10 MG tablet Take 10 mg by mouth daily.     montelukast  (SINGULAIR ) 10 MG tablet Take 10 mg by mouth at bedtime.     nicotine  (NICODERM CQ  - DOSED IN MG/24 HOURS) 21 mg/24hr patch Place 1 patch (21 mg total) onto the skin daily. 28 patch 0   No facility-administered medications prior to visit.

## 2023-12-29 NOTE — Telephone Encounter (Signed)
 Flexible Bronchoscopy 01/05/2024 at 1:00pm Lung Nodule [id number]  Donzell please see bronch info.  Patient is aware of date and time. Bronch email has been sent.

## 2023-12-29 NOTE — Progress Notes (Unsigned)
 Synopsis: Referred in *** by Jossie Artist POUR, MD  Assessment & Plan:   1. Chronic bronchitis, unspecified chronic bronchitis type (HCC) (Primary)  History of COPD, no PFT's on file, will update. Will also initiate ICS/LABA (Symbicort ) and LAMA (Spiriva )  - budesonide -formoterol  (SYMBICORT ) 160-4.5 MCG/ACT inhaler; Inhale 2 puffs into the lungs in the morning and at bedtime.  Dispense: 1 each; Refill: 12 - Tiotropium Bromide  Monohydrate (SPIRIVA  RESPIMAT) 2.5 MCG/ACT AERS; Inhale 2 puffs into the lungs daily.  Dispense: 60 each; Refill: 12 - Pulmonary Function Test; Future - montelukast  (SINGULAIR ) 10 MG tablet; Take 1 tablet (10 mg total) by mouth at bedtime.  Dispense: 30 tablet; Refill: 12  2. Tobacco use disorder  Counseled at length regarding smoking cessation  - nicotine  (NICODERM CQ  - DOSED IN MG/24 HOURS) 14 mg/24hr patch; Place 1 patch (14 mg total) onto the skin daily for 14 days.  Dispense: 14 patch; Refill: 0 - nicotine  (NICODERM CQ  - DOSED IN MG/24 HOURS) 21 mg/24hr patch; Place 1 patch (21 mg total) onto the skin daily.  Dispense: 42 patch; Refill: 0 - nicotine  (NICODERM CQ  - DOSED IN MG/24 HR) 7 mg/24hr patch; Place 1 patch (7 mg total) onto the skin daily for 14 days.  Dispense: 14 patch; Refill: 0 - nicotine  polacrilex (NICOTINE  MINI) 2 MG lozenge; Take 1 lozenge (2 mg total) by mouth every 2 (two) hours as needed for smoking cessation.  Dispense: 72 lozenge; Refill: 3  3. Pneumonia of right middle lobe due to infectious organism  Will need bronchoscopy for fiberoptic evaluation of airway.  - Procedural/ Surgical Case Request: BRONCHOSCOPY, FLEXIBLE; Future   Return in about 5 weeks (around 02/02/2024).  I spent *** minutes caring for this patient today, including {EM billing:28027}  Belva November, MD [facility] 12/29/2023 1:58 PM    End of visit medications:  Meds ordered this encounter  Medications   budesonide -formoterol  (SYMBICORT )  160-4.5 MCG/ACT inhaler    Sig: Inhale 2 puffs into the lungs in the morning and at bedtime.    Dispense:  1 each    Refill:  12   Tiotropium Bromide  Monohydrate (SPIRIVA  RESPIMAT) 2.5 MCG/ACT AERS    Sig: Inhale 2 puffs into the lungs daily.    Dispense:  60 each    Refill:  12   montelukast  (SINGULAIR ) 10 MG tablet    Sig: Take 1 tablet (10 mg total) by mouth at bedtime.    Dispense:  30 tablet    Refill:  12   nicotine  (NICODERM CQ  - DOSED IN MG/24 HOURS) 14 mg/24hr patch    Sig: Place 1 patch (14 mg total) onto the skin daily for 14 days.    Dispense:  14 patch    Refill:  0   nicotine  (NICODERM CQ  - DOSED IN MG/24 HOURS) 21 mg/24hr patch    Sig: Place 1 patch (21 mg total) onto the skin daily.    Dispense:  42 patch    Refill:  0   nicotine  (NICODERM CQ  - DOSED IN MG/24 HR) 7 mg/24hr patch    Sig: Place 1 patch (7 mg total) onto the skin daily for 14 days.    Dispense:  14 patch    Refill:  0   nicotine  polacrilex (NICOTINE  MINI) 2 MG lozenge    Sig: Take 1 lozenge (2 mg total) by mouth every 2 (two) hours as needed for smoking cessation.    Dispense:  72 lozenge    Refill:  3     Current Outpatient Medications:    acyclovir (ZOVIRAX) 400 MG tablet, Take 400 mg by mouth 2 (two) times daily as needed (Flair up)., Disp: , Rfl:    albuterol  (VENTOLIN  HFA) 108 (90 Base) MCG/ACT inhaler, Inhale 2 puffs into the lungs every 6 (six) hours as needed for wheezing or shortness of breath., Disp: , Rfl:    ALPRAZolam  (XANAX ) 0.5 MG tablet, Take 0.5 mg by mouth 3 (three) times daily as needed for anxiety., Disp: , Rfl:    amitriptyline  (ELAVIL ) 150 MG tablet, Take 300 mg by mouth at bedtime., Disp: , Rfl:    atorvastatin  (LIPITOR) 20 MG tablet, Take 1 tablet (20 mg total) by mouth daily., Disp: 90 tablet, Rfl: 3   budesonide -formoterol  (SYMBICORT ) 160-4.5 MCG/ACT inhaler, Inhale 2 puffs into the lungs in the morning and at bedtime., Disp: 1 each, Rfl: 12   clopidogrel  (PLAVIX ) 75 MG  tablet, Take 1 tablet (75 mg total) by mouth daily., Disp: 90 tablet, Rfl: 3   docusate sodium (COLACE) 100 MG capsule, Take 100 mg by mouth daily., Disp: , Rfl:    gabapentin  (NEURONTIN ) 600 MG tablet, Take 900 mg by mouth QID., Disp: , Rfl:    LATUDA  60 MG TABS, Take 60 mg by mouth at bedtime., Disp: , Rfl:    nicotine  (NICODERM CQ  - DOSED IN MG/24 HOURS) 14 mg/24hr patch, Place 1 patch (14 mg total) onto the skin daily for 14 days., Disp: 14 patch, Rfl: 0   nicotine  (NICODERM CQ  - DOSED IN MG/24 HOURS) 21 mg/24hr patch, Place 1 patch (21 mg total) onto the skin daily., Disp: 42 patch, Rfl: 0   nicotine  (NICODERM CQ  - DOSED IN MG/24 HR) 7 mg/24hr patch, Place 1 patch (7 mg total) onto the skin daily for 14 days., Disp: 14 patch, Rfl: 0   nicotine  polacrilex (NICOTINE  MINI) 2 MG lozenge, Take 1 lozenge (2 mg total) by mouth every 2 (two) hours as needed for smoking cessation., Disp: 72 lozenge, Rfl: 3   oxyCODONE -acetaminophen  (PERCOCET) 10-325 MG tablet, Take 1 tablet by mouth every 6 (six) hours as needed., Disp: , Rfl:    oxymorphone (OPANA) 10 MG tablet, Take 10 mg by mouth in the morning and at bedtime., Disp: , Rfl:    pantoprazole  (PROTONIX ) 40 MG tablet, Take 40 mg by mouth daily., Disp: , Rfl:    Tiotropium Bromide  Monohydrate (SPIRIVA  RESPIMAT) 2.5 MCG/ACT AERS, Inhale 2 puffs into the lungs daily., Disp: 60 each, Rfl: 12   zolpidem (AMBIEN) 10 MG tablet, Take 10 mg by mouth daily., Disp: , Rfl:    montelukast  (SINGULAIR ) 10 MG tablet, Take 1 tablet (10 mg total) by mouth at bedtime., Disp: 30 tablet, Rfl: 12   Subjective:   PATIENT ID: Courtney Mayer GENDER: female DOB: 1971-01-19, MRN: [id number]  Chief Complaint  Patient presents with   Lung Mass    No SOB, wheezing or cough.     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   {PULM QUESTIONNAIRES (Optional):33196}  ROS   Objective:   Vitals:   12/29/23 1332  BP: 100/60  Pulse: 89  Temp: (!) 97.5 F (36.4 C)  SpO2: 92%  Weight: 113 lb (  51.3 kg)  Height: 5' 5 (1.651 m)   92% on *** LPM *** RA BMI Readings from Last 3 Encounters:  12/29/23 18.80 kg/m  12/21/23 19.14 kg/m  05/26/23 19.14 kg/m   Wt Readings from Last 3 Encounters:  12/29/23 113 lb (51.3 kg)  12/21/23 115 lb (52.2 kg)  05/26/23 115 lb (52.2 kg)    Physical Exam    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   Cerebrovascular accident (CVA) due to occlusion of left middle cerebral artery (HCC)    COPD (chronic obstructive pulmonary disease) (HCC)    Pneumonia of right middle lobe due to infectious organism 12/17/2023     Family History  Problem Relation Age of Onset   Healthy Mother    Healthy Father      Past Surgical History:  Procedure Laterality Date   atrial valve replacement     BREAST SURGERY Right    cyst removal   BUBBLE STUDY  06/19/2021   Procedure: BUBBLE STUDY;  Surgeon: Alveta Aleene PARAS, MD;  Location: [facility] ENDOSCOPY;  Service: Cardiovascular;;   CESAREAN SECTION     LOBECTOMY     LUNG SURGERY Left    TEE WITHOUT CARDIOVERSION N/A 06/19/2021   Procedure: TRANSESOPHAGEAL ECHOCARDIOGRAM (TEE);  Surgeon: Alveta, Aleene PARAS, MD;  Location: [facility] ENDOSCOPY;  Service: Cardiovascular;  Laterality: N/A;   WRIST FRACTURE SURGERY      Social History   Socioeconomic History   Marital status: Single    Spouse name: Not on file   Number of children: Not on file   Years of education: Not on file    Highest education level: Not on file  Occupational History   Not on file  Tobacco Use   Smoking status: Every Day    Current packs/day: 1.50    Types: Cigarettes   Smokeless tobacco: Never   Tobacco comments:    Smokes 1.5 PPD- khj 12/29/2023        Started smoking at 53 yrs old    Smoked 2PPD at her heaviest  Vaping Use   Vaping status: Every Day   Substances: Nicotine   Substance and Sexual Activity   Alcohol use: Not Currently   Drug use: Yes    Types: Marijuana   Sexual activity: Not on file  Other Topics Concern   Not on file  Social History Narrative   Right handed   Caffiene none   Lives with mom,    widowed   Social Drivers of Health   Financial Resource Strain: Low Risk  (08/14/2021)   Received from [facility]   Overall Financial Resource Strain (CARDIA)    Difficulty of Paying Living Expenses: Not hard at all  Food Insecurity: No Food Insecurity (09/18/2022)   [facility]   Hunger Vital Sign    Within the past 12 months, you worried that your food would run out before you got the money to buy more.: Never true    Within the past 12 months, the food you bought just didn't last and you didn't have money to get more.: Never true  Transportation Needs: No Transportation Needs (09/18/2022)   Received from [facility] - Transportation    Lack of Transportation (Medical): No  Lack of Transportation (Non-Medical): No  Physical Activity: Unknown (08/04/2021)   Received from [facility]   Exercise Vital Sign    On average, how many days per week do you engage in moderate to strenuous exercise (like a brisk walk)?: 0 days    Minutes of Exercise per Session: Not on file  Stress: Stress Concern Present (09/18/2022)   Received from [facility] - Occupational Stress Questionnaire    Feeling of Stress : To some extent  Social Connections: Somewhat Isolated (08/04/2021)   Received from [facility]    Social Network    How would you rate your social network (family, work, friends)?: Restricted participation with some degree of social isolation  Intimate Partner Violence: Not At Risk (09/18/2022)   Received from [facility]   HITS    Over the last 12 months how often did your partner physically hurt you?: Never    Over the last 12 months how often did your partner insult you or talk down to you?: Never    Over the last 12 months how often did your partner threaten you with physical harm?: Never    Over the last 12 months how often did your partner scream or curse at you?: Never     Allergies  Allergen Reactions   Tramadol Palpitations    Can't take Ultram ER - palpitations  Regular Tramadol is fine to take Can't take Ultram ER - palpitations  Regular Tramadol is fine to take    Amoxicillin  Diarrhea   Compazine [Prochlorperazine] Other (See Comments)    Excitability   Compazine [Prochlorperazine] Anxiety     CBC    Component Value Date/Time   WBC 11.2 (H) 12/17/2023 1500   RBC 4.26 12/17/2023 1500   HGB 13.8 12/17/2023 1500   HGB 12.7 05/30/2021 1145   HCT 42.1 12/17/2023 1500   HCT 36.8 05/30/2021 1145   PLT 462 (H) 12/17/2023 1500   PLT 479 (H) 05/30/2021 1145   MCV 98.8 12/17/2023 1500   MCV 95 05/30/2021 1145   MCH 32.4 12/17/2023 1500   MCHC 32.8 12/17/2023 1500   RDW 14.6 12/17/2023 1500   RDW 12.6 05/30/2021 1145   LYMPHSABS 3.7 05/17/2021 0139   MONOABS 0.6 05/17/2021 0139   EOSABS 0.0 05/17/2021 0139   BASOSABS 0.0 05/17/2021 0139    Pulmonary Functions Testing Results:     No data to display          Outpatient Medications Prior to Visit  Medication Sig Dispense Refill   acyclovir (ZOVIRAX) 400 MG tablet Take 400 mg by mouth 2 (two) times daily as needed (Flair up).     albuterol  (VENTOLIN  HFA) 108 (90 Base) MCG/ACT inhaler Inhale 2 puffs into the lungs every 6 (six) hours as needed for wheezing or shortness of breath.     ALPRAZolam   (XANAX ) 0.5 MG tablet Take 0.5 mg by mouth 3 (three) times daily as needed for anxiety.     amitriptyline  (ELAVIL ) 150 MG tablet Take 300 mg by mouth at bedtime.     atorvastatin  (LIPITOR) 20 MG tablet Take 1 tablet (20 mg total) by mouth daily. 90 tablet 3   clopidogrel  (PLAVIX ) 75 MG tablet Take 1 tablet (75 mg total) by mouth daily. 90 tablet 3   docusate sodium (COLACE) 100 MG capsule Take 100 mg by mouth daily.     gabapentin  (NEURONTIN ) 600 MG tablet Take 900 mg by mouth QID.  LATUDA  60 MG TABS Take 60 mg by mouth at bedtime.     oxyCODONE -acetaminophen  (PERCOCET) 10-325 MG tablet Take 1 tablet by mouth every 6 (six) hours as needed.     oxymorphone (OPANA) 10 MG tablet Take 10 mg by mouth in the morning and at bedtime.     pantoprazole  (PROTONIX ) 40 MG tablet Take 40 mg by mouth daily.     zolpidem (AMBIEN) 10 MG tablet Take 10 mg by mouth daily.     montelukast  (SINGULAIR ) 10 MG tablet Take 10 mg by mouth at bedtime.     nicotine  (NICODERM CQ  - DOSED IN MG/24 HOURS) 21 mg/24hr patch Place 1 patch (21 mg total) onto the skin daily. 28 patch 0   No facility-administered medications prior to visit.

## 2023-12-29 NOTE — Patient Instructions (Signed)
 The [place]  Quitline: Call 1-800-QUIT-NOW (682 840 6469). The [place] Quitline is a free service for [place]  residents. Trained counselors are available from 8 am until 3 am, 365 days per year. Services are available in both [place] and [place].   Web Resources Free online support programs can help you track your progress and share experiences with others who are quitting. These are examples: www.becomeanex.org www.trytostop.org  www.smokefree.gov  www.https://www.vargas.com/.aspx  [facility]: offers comprehensive in-person tobacco treatment counseling at [facility] building ([address], [place] [address]).  Open to everyone. Virtual appointments available. Free parking. Call [phone] to schedule an appointment or [phone] for general information.    Tobacco Cessation Medications  Nicotine  Replacement Therapy (NRT)  Nicotine  is the addictive part of tobacco smoke, but not the most dangerous part. There are 7000 other toxins in cigarettes, including carbon monoxide, that cause disease. People do not generally become addicted to medication. Common problems: People don't use enough medication or stop too early. Medications are safe and effective. Overdose is very uncommon. Use medications as long as needed (3 months minimum). Some combinations work better than single medications. Long acting medications like the NRT patch and bupropion provide continuous treatment for withdrawal symptoms.  PLUS  Short acting medications like the NRT gum, lozenge, inhaler, and nasal spray help people to cope with breakthrough cravings.  ? Nicotine  Patch  Place patch on hairless skin on upper body, including arms and back. Each day: discard old patch, shower, apply new patch to a different site. Apply hydrocortisone cream to mildly red/irritated areas. Call provider if rash develops. If patch causes sleep disturbance, remove patch  at bedtime and replace each morning after shower. Side effects may include: skin irritation, headache, insomnia, abnormal/vivid dreams.  ? Nicotine  Gum  Chew gum slowly, park in cheek when peppery taste or tingling sensation begins (about 15-30 chews). When taste or tingling goes away, begin chewing again. Use until nicotine  is gone (taste or tingle does not return, usually 30 minutes). Park in different areas of mouth. Nicotine  is absorbed through the lining of the mouth. Use enough to control cravings, up to 24 pieces per day (if used alone). Avoid eating or drinking for 15 minutes before using and during use. Side effects may include: mouth/jaw soreness, hiccups, indigestion, hypersalivation.  If gum is not chewed correctly, additional side effects may include lightheadedness, nausea/vomiting, throat and mouth irritation.  ? Nicotine  Lozenge  Allow to dissolve slowly in mouth (20-30 minutes). Do not chew or swallow. Nicotine  release may cause a warm tingling sensation. Occasionally rotate to different areas of the mouth. Use enough to control cravings, up to 20 lozenges per day (if used alone). Avoid eating or drinking for 15 minutes before using and during use. Side effects may include: nausea, hiccups, cough, heartburn, headache, gas, insomnia.  ? Nicotine  Nasal Spray Use 1 spray in each nostril (1 dose) and tilt head back for 1 minute. Do not sniff, swallow, or inhale through nose.  Use at least 8 doses (1 spray in each nostril) , up to 40 doses per day (if used alone). To reduce nasal irritation, spray on cotton swab and insert into nose. Side effects may include: nasal and/or throat irritation (hot, peppery, or burning sensation), nasal irritation, tearing, sneezing, cough, headache.  ? Nicotine  Oral Inhaler (puffer) Inhale into the back of the throat or puff in short breaths. Do not inhale into the lungs.  Puff continuously for 20 minutes (about 80 puffs) until cartridge  is  empty. Change cartridge when it loses the "burning in throat" sensation (feels like air only). Open cartridges can be saved and used again within 24 hours. Use at least 6 and up to 16 cartridges per day (if used alone).  Avoid eating or drinking for 15 minutes before using and during use. Side effects may include: mouth and/or throat irritation, unpleasant taste, cough, nasal irritation, indigestion, hiccups, headache.  ? Chantix  (varenicline ) Days 1-3: Take one 0.5 mg white pill each morning for 3 days, one week before quit date. Days 4-7: Increase to one 0.5 mg white pill twice a day in morning and evening for 4 days.  On Day 8 (target quit date), increase to one 1 mg blue pill twice a day. Maintain this dose for a minimum of 3 months. Take with food and a full glass of water to reduce nausea. Be sure that the two doses are at least 8 hours apart, but try to take second dose early in the evening (i.e. 6 pm) to avoid sleep problems. Common side effects include: nausea, insomnia, headache, abnormal/vivid dreams. Tell your doctor if you have any history of psychiatric illness prior to starting Chantix .  STOP taking CHANTIX  and contact a healthcare provider immediately if you experience agitation, hostility, depressed mood, changes in thoughts or behavior that are not typical for you, thinking about or attempting suicide, allergic or skin reactions including swelling, rash, redness, or peeling of the skin.  For patients who have heart disease: Smoking is a major risk factor for cardiovascular disease, and Chantix  can help you quit smoking. Chantix  may be associated with a small, increased risk of certain heart events in patients who have heart disease. If you have any new or worsening symptoms of heart disease while taking Chantix , such as shortness of breath or trouble breathing, new or worsening chest pain, or new or worsening pain in your legs when walking, call your doctor or get emergency medical  help immediately.  ? Wellbutrin / Zyban (bupropion) Take one 150 mg pill each morning for 3 days, one week before target quit date. On Day 4, increase to one 150 mg pill twice a day, morning and evening.  Maintain this dose for a minimum of 3 months. Be sure that the two doses are at least 8 hours apart, but try to take second dose early in the evening (i.e. 6 pm) to avoid sleep problems. Avoid or minimize use of alcohol when taking this medication. Common side effects include: dry mouth, headache, insomnia, nausea, weight loss.  Risk of seizure is 04/998. STOP taking BUPROPION and contact a healthcare provider immediately if you experience agitation, hostility, depressed mood, changes in thoughts or behavior that are not typical for you, thinking about or attempting suicide, allergic or skin reactions including swelling, rash, redness, or peeling of the skin.

## 2023-12-30 NOTE — Telephone Encounter (Signed)
 For the code 68377 Prior Auth Not Required Refer # Intake [id number]

## 2023-12-30 NOTE — Telephone Encounter (Signed)
 Noted. Nothing further needed.

## 2024-01-04 NOTE — Progress Notes (Signed)
 Pts surgery got cancelled on 8-26 due to not being told to stop her Plavix . I called pt today to make sure she had stopped her Plavix  for surgery tomorrow. Pt states she has held Plavix  as instructed 5 days. Last dose was on 12-30-23 Wednesday

## 2024-01-05 ENCOUNTER — Encounter
Admission: RE | Disposition: A | Discharge status: Home / Self Care | Payer: Self-pay | Source: Home / Self Care | Attending: Student in an Organized Health Care Education/Training Program

## 2024-01-05 ENCOUNTER — Ambulatory Visit
Admission: RE | Admit: 2024-01-05 | Discharge: 2024-01-05 | Disposition: A | Discharge status: Home / Self Care | Attending: Student in an Organized Health Care Education/Training Program | Admitting: Student in an Organized Health Care Education/Training Program

## 2024-01-05 ENCOUNTER — Ambulatory Visit: Payer: Self-pay | Admitting: Urgent Care

## 2024-01-05 ENCOUNTER — Ambulatory Visit

## 2024-01-05 ENCOUNTER — Encounter: Payer: Self-pay | Admitting: Student in an Organized Health Care Education/Training Program

## 2024-01-05 ENCOUNTER — Other Ambulatory Visit: Payer: Self-pay

## 2024-01-05 DIAGNOSIS — Z7951 Long term (current) use of inhaled steroids: Secondary | ICD-10-CM | POA: Insufficient documentation

## 2024-01-05 DIAGNOSIS — Z604 Social exclusion and rejection: Secondary | ICD-10-CM | POA: Diagnosis not present

## 2024-01-05 DIAGNOSIS — F17210 Nicotine dependence, cigarettes, uncomplicated: Secondary | ICD-10-CM | POA: Insufficient documentation

## 2024-01-05 DIAGNOSIS — J440 Chronic obstructive pulmonary disease with acute lower respiratory infection: Secondary | ICD-10-CM | POA: Diagnosis not present

## 2024-01-05 DIAGNOSIS — Z7902 Long term (current) use of antithrombotics/antiplatelets: Secondary | ICD-10-CM | POA: Diagnosis not present

## 2024-01-05 DIAGNOSIS — J189 Pneumonia, unspecified organism: Secondary | ICD-10-CM | POA: Diagnosis not present

## 2024-01-05 DIAGNOSIS — J9621 Acute and chronic respiratory failure with hypoxia: Secondary | ICD-10-CM | POA: Diagnosis not present

## 2024-01-05 HISTORY — PX: FLEXIBLE BRONCHOSCOPY: SHX5094

## 2024-01-05 SURGERY — BRONCHOSCOPY, FLEXIBLE
Anesthesia: General | Laterality: Bilateral

## 2024-01-05 MED ORDER — OXYCODONE HCL 5 MG PO TABS: 5.0000 mg | ORAL_TABLET | Freq: Once | ORAL | Status: DC | PRN

## 2024-01-05 MED ORDER — PROPOFOL 1000 MG/100ML IV EMUL
INTRAVENOUS | Status: AC
  Filled 2024-01-05: qty 100

## 2024-01-05 MED ORDER — FENTANYL CITRATE (PF) 100 MCG/2ML IJ SOLN
INTRAMUSCULAR | Status: AC
  Filled 2024-01-05: qty 2

## 2024-01-05 MED ORDER — ROCURONIUM BROMIDE 100 MG/10ML IV SOLN
INTRAVENOUS | Status: DC | PRN
  Administered 2024-01-05: 50 mg via INTRAVENOUS

## 2024-01-05 MED ORDER — ORAL CARE MOUTH RINSE: 15.0000 mL | Freq: Once | OROMUCOSAL | Status: AC

## 2024-01-05 MED ORDER — MIDAZOLAM HCL 2 MG/2ML IJ SOLN
INTRAMUSCULAR | Status: AC
  Filled 2024-01-05: qty 2

## 2024-01-05 MED ORDER — CHLORHEXIDINE GLUCONATE 0.12 % MT SOLN
15.0000 mL | Freq: Once | OROMUCOSAL | Status: AC
  Administered 2024-01-05: 15 mL via OROMUCOSAL

## 2024-01-05 MED ORDER — LACTATED RINGERS IV SOLN: INTRAVENOUS | Status: DC

## 2024-01-05 MED ORDER — SUGAMMADEX SODIUM 200 MG/2ML IV SOLN
INTRAVENOUS | Status: DC | PRN
  Administered 2024-01-05: 400 mg via INTRAVENOUS

## 2024-01-05 MED ORDER — LIDOCAINE HCL (CARDIAC) PF 100 MG/5ML IV SOSY
PREFILLED_SYRINGE | INTRAVENOUS | Status: DC | PRN
  Administered 2024-01-05: 60 mg via INTRAVENOUS
  Administered 2024-01-05: 40 mg via INTRAVENOUS

## 2024-01-05 MED ORDER — ROCURONIUM BROMIDE 10 MG/ML (PF) SYRINGE
PREFILLED_SYRINGE | INTRAVENOUS | Status: AC
  Filled 2024-01-05: qty 10

## 2024-01-05 MED ORDER — CHLORHEXIDINE GLUCONATE 0.12 % MT SOLN
OROMUCOSAL | Status: AC
  Filled 2024-01-05: qty 15

## 2024-01-05 MED ORDER — FENTANYL CITRATE (PF) 100 MCG/2ML IJ SOLN: 25.0000 ug | INTRAMUSCULAR | Status: DC | PRN

## 2024-01-05 MED ORDER — ACETAMINOPHEN 10 MG/ML IV SOLN: 1000.0000 mg | Freq: Once | INTRAVENOUS | Status: DC | PRN

## 2024-01-05 MED ORDER — FENTANYL CITRATE (PF) 100 MCG/2ML IJ SOLN
INTRAMUSCULAR | Status: DC | PRN
  Administered 2024-01-05 (×2): 50 ug via INTRAVENOUS

## 2024-01-05 MED ORDER — DROPERIDOL 2.5 MG/ML IJ SOLN: 0.6250 mg | Freq: Once | INTRAMUSCULAR | Status: DC | PRN

## 2024-01-05 MED ORDER — ONDANSETRON HCL 4 MG/2ML IJ SOLN
INTRAMUSCULAR | Status: AC
  Filled 2024-01-05: qty 2

## 2024-01-05 MED ORDER — PROPOFOL 500 MG/50ML IV EMUL
INTRAVENOUS | Status: DC | PRN
  Administered 2024-01-05: 125 ug/kg/min via INTRAVENOUS

## 2024-01-05 MED ORDER — DEXAMETHASONE SODIUM PHOSPHATE 10 MG/ML IJ SOLN
INTRAMUSCULAR | Status: DC | PRN
  Administered 2024-01-05: 10 mg via INTRAVENOUS

## 2024-01-05 MED ORDER — LIDOCAINE HCL (PF) 2 % IJ SOLN
INTRAMUSCULAR | Status: AC
  Filled 2024-01-05: qty 5

## 2024-01-05 MED ORDER — DEXAMETHASONE SODIUM PHOSPHATE 10 MG/ML IJ SOLN
INTRAMUSCULAR | Status: AC
  Filled 2024-01-05: qty 1

## 2024-01-05 MED ORDER — MIDAZOLAM HCL 2 MG/2ML IJ SOLN
INTRAMUSCULAR | Status: DC | PRN
  Administered 2024-01-05: 2 mg via INTRAVENOUS

## 2024-01-05 MED ORDER — PROPOFOL 10 MG/ML IV BOLUS
INTRAVENOUS | Status: DC | PRN
  Administered 2024-01-05: 200 mg via INTRAVENOUS

## 2024-01-05 MED ORDER — ONDANSETRON HCL 4 MG/2ML IJ SOLN
INTRAMUSCULAR | Status: DC | PRN
Start: 2024-01-05 — End: 2024-01-05
  Administered 2024-01-05: 4 mg via INTRAVENOUS

## 2024-01-05 MED ORDER — OXYCODONE HCL 5 MG/5ML PO SOLN: 5.0000 mg | Freq: Once | ORAL | Status: DC | PRN

## 2024-01-05 NOTE — Anesthesia Procedure Notes (Signed)
 Procedure Name: Intubation Date/Time: 01/05/2024 1:30 PM  Performed by: Trudy Rankin LABOR, CRNAPre-anesthesia Checklist: Patient identified, Patient being monitored, Timeout performed, Emergency Drugs available and Suction available Patient Re-evaluated:Patient Re-evaluated prior to induction Oxygen Delivery Method: Circle System Utilized Preoxygenation: Pre-oxygenation with 100% oxygen Induction Type: IV induction and Rapid sequence Laryngoscope Size: Mac and 3 Grade View: Grade I Tube type: Oral Tube size: 8.5 mm Number of attempts: 1 Airway Equipment and Method: Stylet Placement Confirmation: ETT inserted through vocal cords under direct vision, positive ETCO2 and breath sounds checked- equal and bilateral Secured at: 21 cm Tube secured with: Tape Dental Injury: Teeth and Oropharynx as per pre-operative assessment

## 2024-01-05 NOTE — Op Note (Signed)
 Bronchoscopy Procedure Note  Courtney Mayer  [id number]  12/27/1970  Date:01/05/24  Time:1:45 PM   Provider Performing:Jlee Harkless   Procedure(s):  Flexible Bronchoscopy (68377), Flexible bronchoscopy with brushing (68376), Flexible bronchoscopy with bronchial alveolar lavage (68375), and Initial Therapeutic Aspiration of Tracheobronchial Tree (68354)  Indication(s) RML collapse, tree in bud opacities  Consent Risks of the procedure as well as the alternatives and risks of each were explained to the patient and/or caregiver.  Consent for the procedure was obtained and is signed in the bedside chart  Anesthesia TIVA   Time Out Verified patient identification, verified procedure, site/side was marked, verified correct patient position, special equipment/implants available, medications/allergies/relevant history reviewed, required imaging and test results available.   Sterile Technique Usual hand hygiene, masks, gowns, and gloves were used   Procedure Description Bronchoscope advanced through endotracheal tube and into airway.  Airways were examined down to subsegmental level with findings noted below.   Following diagnostic evaluation, BAL(s) performed in RML with normal saline and return of 60 mL of turbid fluid, Brushing(s) performed in left lower lobe, and Therapeutic aspiration performed in tracheobronchial tree  Findings: thick secretions noted throughout the tracheobronchial tree. The airway was examined to the segmental level and no endobronchial lesions were noted. BAL performed in RML (120 mL in, 60 mL out). Protected brushing performed in the LLL. Bronchial washings and therapeutic aspiration of secretions performed. Bronchial washing sample obtained from the LLL.  Main Car[place]    Left mainstem with lobectomy stump    Left Lower Lobe    Right Upper Lobe    Right Lower Lobe     Right Middle Lobe     Complications/Tolerance None; patient  tolerated the procedure well. Chest X-ray is needed post procedure.   EBL Minimal   Specimen(s) BAL RML Brush LLL Washing LLL  Courtney November, MD [facility] 01/05/2024 1:48 PM

## 2024-01-05 NOTE — Progress Notes (Signed)
 Per Dr. Philipp patient chest xray is good, and is cleared to go home, plavix  to be restarted tomorrow.

## 2024-01-05 NOTE — Interval H&P Note (Signed)
 Patient presenting with right middle lobe collapse and tree in bud opacities in the left lower lobe. She presents for flexible bronchoscopy for airways survey and BAL. She is appropriate for the procedure.  Belva November, MD [facility] 01/05/2024 1:01 PM

## 2024-01-05 NOTE — Anesthesia Preprocedure Evaluation (Signed)
 Anesthesia Evaluation  Patient identified by MRN, date of birth, ID band Patient awake    Reviewed: Allergy & Precautions, H&P , NPO status , Patient's Chart, lab work & pertinent test results, reviewed documented beta blocker date and time   Airway Mallampati: II  TM Distance: >3 FB Neck ROM: full    Dental  (+) Teeth Intact   Pulmonary pneumonia, COPD, Current SmokerPatient did not abstain from smoking.   Pulmonary exam normal        Cardiovascular Exercise Tolerance: Poor negative cardio ROS Normal cardiovascular exam Rhythm:regular Rate:Normal     Neuro/Psych  PSYCHIATRIC DISORDERS      CVA    GI/Hepatic negative GI ROS, Neg liver ROS,,,  Endo/Other  negative endocrine ROS    Renal/GU negative Renal ROS  negative genitourinary   Musculoskeletal   Abdominal   Peds  Hematology negative hematology ROS (+)   Anesthesia Other Findings Past Medical History: 05/15/2021: Acute respiratory failure with hypoxia (HCC) No date: Back pain 05/26/2023: Brain aneurysm No date: Cancer [facility])     Comment:  lung 05/17/2021: Cerebral thrombosis with cerebral infarction (HCC) No date: Cerebrovascular accident (CVA) due to occlusion of left  middle cerebral artery (HCC) No date: COPD (chronic obstructive pulmonary disease) (HCC) 12/17/2023: Pneumonia of right middle lobe due to infectious organism Past Surgical History: No date: atrial valve replacement No date: BREAST SURGERY; Right     Comment:  cyst removal 06/19/2021: BUBBLE STUDY     Comment:  Procedure: BUBBLE STUDY;  Surgeon: Alveta Aleene PARAS, MD;              Location: [facility] ENDOSCOPY;  Service: Cardiovascular;; No date: CESAREAN SECTION No date: LOBECTOMY No date: LUNG SURGERY; Left 06/19/2021: TEE WITHOUT CARDIOVERSION; N/A     Comment:  Procedure: TRANSESOPHAGEAL ECHOCARDIOGRAM (TEE);                Surgeon: Alveta Aleene PARAS, MD;  Location: [facility] ENDOSCOPY;                 Service: Cardiovascular;  Laterality: N/A; No date: WRIST FRACTURE SURGERY   Reproductive/Obstetrics negative OB ROS                              Anesthesia Physical Anesthesia Plan  ASA: 3  Anesthesia Plan: General ETT   Post-op Pain Management:    Induction:   PONV Risk Score and Plan: 3  Airway Management Planned:   Additional Equipment:   Intra-op Plan:   Post-operative Plan:   Informed Consent: I have reviewed the patients History and Physical, chart, labs and discussed the procedure including the risks, benefits and alternatives for the proposed anesthesia with the patient or authorized representative who has indicated his/her understanding and acceptance.     Dental Advisory Given  Plan Discussed with: CRNA  Anesthesia Plan Comments:         Anesthesia Quick Evaluation

## 2024-01-05 NOTE — Transfer of Care (Signed)
 Immediate Anesthesia Transfer of Care Note  Patient: Courtney Mayer  Procedure(s) Performed: BRONCHOSCOPY, FLEXIBLE (Bilateral)  Patient Location: PACU  Anesthesia Type:General  Level of Consciousness: awake and alert   Airway & Oxygen Therapy: Patient Spontanous Breathing and Patient connected to face mask oxygen  Post-op Assessment: Report given to RN and Post -op Vital signs reviewed and stable  Post vital signs: Reviewed and stable  Last Vitals:  Vitals Value Taken Time  BP 108/68 01/05/24 14:00  Temp 97.4 f 1400  Pulse 74 01/05/24 14:02  Resp 22 01/05/24 14:02  SpO2 99 % 01/05/24 14:02  Vitals shown include unfiled device data.  Last Pain:  Vitals:   01/05/24 1227  TempSrc: Temporal  PainSc: 0-No pain         Complications: No notable events documented.

## 2024-01-06 ENCOUNTER — Encounter: Payer: Self-pay | Admitting: Student in an Organized Health Care Education/Training Program

## 2024-01-06 ENCOUNTER — Ambulatory Visit: Admitting: Student in an Organized Health Care Education/Training Program

## 2024-01-06 LAB — BODY FLUID CELL COUNT WITH DIFFERENTIAL
Eos, Fluid: 0 %
Lymphs, Fluid: 5 %
Monocyte-Macrophage-Serous Fluid: 80 % (ref 50–90)
Neutrophil Count, Fluid: 15 % (ref 0–25)
Total Nucleated Cell Count, Fluid: 137 uL (ref 0–1000)

## 2024-01-06 LAB — PATHOLOGIST SMEAR REVIEW

## 2024-01-07 LAB — ACID FAST SMEAR (AFB, MYCOBACTERIA)
Acid Fast Smear: NEGATIVE
Acid Fast Smear: NEGATIVE
Acid Fast Smear: NEGATIVE

## 2024-01-07 LAB — PNEUMOCYSTIS JIROVECI SMEAR BY DFA

## 2024-01-07 NOTE — Anesthesia Postprocedure Evaluation (Signed)
 Anesthesia Post Note  Patient: Bryahna Lesko  Procedure(s) Performed: BRONCHOSCOPY, FLEXIBLE (Bilateral)  Patient location during evaluation: PACU Anesthesia Type: General Level of consciousness: awake and alert Pain management: pain level controlled Vital Signs Assessment: post-procedure vital signs reviewed and stable Respiratory status: spontaneous breathing, nonlabored ventilation, respiratory function stable and patient connected to nasal cannula oxygen Cardiovascular status: blood pressure returned to baseline and stable Postop Assessment: no apparent nausea or vomiting Anesthetic complications: no   No notable events documented.   Last Vitals:  Vitals:   01/05/24 1430 01/05/24 1445  BP: (!) 120/93 132/84  Pulse: 79 85  Resp: 16 18  Temp: (!) 36.3 C (!) 36.1 C  SpO2: 94% 94%    Last Pain:  Vitals:   01/05/24 1445  TempSrc: Temporal  PainSc: 0-No pain                 Prentice Murphy

## 2024-01-09 LAB — CULTURE, RESPIRATORY W GRAM STAIN
Culture: NO GROWTH
Culture: NO GROWTH
Gram Stain: NONE SEEN
Gram Stain: NONE SEEN

## 2024-01-11 ENCOUNTER — Ambulatory Visit: Payer: Self-pay | Admitting: Student in an Organized Health Care Education/Training Program

## 2024-01-11 DIAGNOSIS — J189 Pneumonia, unspecified organism: Secondary | ICD-10-CM

## 2024-01-11 DIAGNOSIS — J42 Unspecified chronic bronchitis: Secondary | ICD-10-CM

## 2024-01-11 MED ORDER — INCRUSE ELLIPTA 62.5 MCG/ACT IN AEPB: 1.0000 | INHALATION_SPRAY | Freq: Every day | RESPIRATORY_TRACT | 12 refills | Status: DC

## 2024-01-11 MED ORDER — CIPROFLOXACIN HCL 750 MG PO TABS: 750.0000 mg | ORAL_TABLET | Freq: Two times a day (BID) | ORAL | 0 refills | Status: AC

## 2024-01-13 LAB — SUSCEPTIBILITY RESULT

## 2024-01-17 LAB — CULTURE, RESPIRATORY W GRAM STAIN

## 2024-01-27 ENCOUNTER — Ambulatory Visit: Admitting: Student in an Organized Health Care Education/Training Program

## 2024-01-27 ENCOUNTER — Encounter: Payer: Self-pay | Admitting: Student in an Organized Health Care Education/Training Program

## 2024-01-27 VITALS — BP 90/62 | HR 88 | Temp 97.7°F | Ht 65.0 in | Wt 113.6 lb

## 2024-01-27 DIAGNOSIS — F17210 Nicotine dependence, cigarettes, uncomplicated: Secondary | ICD-10-CM | POA: Diagnosis not present

## 2024-01-27 DIAGNOSIS — F17200 Nicotine dependence, unspecified, uncomplicated: Secondary | ICD-10-CM

## 2024-01-27 DIAGNOSIS — J42 Unspecified chronic bronchitis: Secondary | ICD-10-CM | POA: Diagnosis not present

## 2024-01-27 DIAGNOSIS — J441 Chronic obstructive pulmonary disease with (acute) exacerbation: Secondary | ICD-10-CM

## 2024-01-27 DIAGNOSIS — J449 Chronic obstructive pulmonary disease, unspecified: Secondary | ICD-10-CM

## 2024-01-27 LAB — PULMONARY FUNCTION TEST
FEF 25-75 Post: 0.9 L/s
FEF 25-75 Pre: 0.74 L/s
FEF2575-%Change-Post: 20 %
FEF2575-%Pred-Post: 33 %
FEF2575-%Pred-Pre: 27 %
FEV1-%Change-Post: 5 %
FEV1-%Pred-Post: 49 %
FEV1-%Pred-Pre: 46 %
FEV1-Post: 1.38 L
FEV1-Pre: 1.31 L
FEV1FVC-%Change-Post: -2 %
FEV1FVC-%Pred-Pre: 81 %
FEV6-%Change-Post: 8 %
FEV6-%Pred-Post: 62 %
FEV6-%Pred-Pre: 58 %
FEV6-Post: 2.18 L
FEV6-Pre: 2.02 L
FEV6FVC-%Pred-Post: 103 %
FEV6FVC-%Pred-Pre: 103 %
FVC-%Change-Post: 8 %
FVC-%Pred-Post: 61 %
FVC-%Pred-Pre: 56 %
FVC-Post: 2.18 L
FVC-Pre: 2.02 L
Post FEV1/FVC ratio: 63 %
Post FEV6/FVC ratio: 100 %
Pre FEV1/FVC ratio: 65 %
Pre FEV6/FVC Ratio: 100 %

## 2024-01-27 MED ORDER — IPRATROPIUM-ALBUTEROL 0.5-2.5 (3) MG/3ML IN SOLN
3.0000 mL | Freq: Once | RESPIRATORY_TRACT | Status: AC
  Administered 2024-01-27: 3 mL via RESPIRATORY_TRACT

## 2024-01-27 MED ORDER — IPRATROPIUM-ALBUTEROL 0.5-2.5 (3) MG/3ML IN SOLN: 3.0000 mL | Freq: Four times a day (QID) | RESPIRATORY_TRACT | 2 refills | Status: AC | PRN

## 2024-01-27 MED ORDER — IPRATROPIUM-ALBUTEROL 0.5-2.5 (3) MG/3ML IN SOLN
3.0000 mL | Freq: Four times a day (QID) | RESPIRATORY_TRACT | Status: DC | PRN
Start: 2024-01-27 — End: 2024-01-27

## 2024-01-27 MED ORDER — PREDNISONE 50 MG PO TABS: 50.0000 mg | ORAL_TABLET | Freq: Every day | ORAL | 0 refills | Status: AC

## 2024-01-27 MED ORDER — LEVOFLOXACIN 750 MG PO TABS: 750.0000 mg | ORAL_TABLET | Freq: Every day | ORAL | 0 refills | Status: DC

## 2024-01-27 MED ORDER — METHYLPREDNISOLONE ACETATE 80 MG/ML IJ SUSP
80.0000 mg | Freq: Once | INTRAMUSCULAR | Status: AC
  Administered 2024-01-27: 80 mg via INTRAMUSCULAR

## 2024-01-27 MED ORDER — IPRATROPIUM-ALBUTEROL 0.5-2.5 (3) MG/3ML IN SOLN: 3.0000 mL | Freq: Four times a day (QID) | RESPIRATORY_TRACT | Status: DC | PRN

## 2024-01-27 NOTE — Progress Notes (Signed)
 Assessment & Plan:   #COPD with acute exacerbation (HCC) (Primary)  Presents today for follow up, and is note to be more short of breath and exhibiting symptoms that are suggestive of a COPD exacerbation. She is on multiple psychotropic medications, and it is hard to delineate whether symptoms are from medications vs a component of hypercapnia. She is appropriate on exam today, but report of mild forgetfulness is concerning.  Previous spirometry had shown COPD, and repeated today similarly shows this. Given her illness we did not perform full PFT's, and will re-attempt these in the future to establish a baseline. Max eosinophil count was 400 in 2022. She did receive a prescription for Montelukast  by her previous providers which I will resume for now, and consider stopping on follow up depending on the findings from her PFT's.  We will treat for a COPD exacerbation with antibiotics (levofloxacin), steroids (prednisone ), and nebulizers. She will continue on her home ICS/LABA (Symbicort ) and LAMA (Spiriva  Respimat).    -Return precautions given, call EMS if increased lethargy, worsening breathing. - levofloxacin (LEVAQUIN) 750 MG tablet; Take 1 tablet (750 mg total) by mouth daily for 7 days.  Dispense: 10 tablet; Refill: 0 - predniSONE  (DELTASONE ) 50 MG tablet; Take 1 tablet (50 mg total) by mouth daily with breakfast for 5 days.  Dispense: 5 tablet; Refill: 0 - Depomedrol 80 mg IM once - duonebs once in clinic - Smoking cessation encouraged  #Tobacco use disorder   Counseled regarding smoking cessation. Cessation aides prescribed during our prior visit.  #Pneumonia of right middle lobe due to infectious organism   She was noted to have RML collapse on the most recent chest CT. On my review, the RML is collapsed with some narrowing in the lateral segment of the RML (RB4). This was not present on a previous CT 2 months ago. I suspect this is due to mucus plugging. Bronchoscopy did not  show any endobronchial lesions or stenosis. Cultures did grow multiple organisms, including pseudomonas, treated with PO Ciprofloxacin . Today, I will resend a course of antibiotics (see above). AFB and fungal cultures remain no growth to date.   Return in about 4 weeks (around 02/24/2024).  I spent 40 minutes caring for this patient today, including preparing to see the patient, obtaining a medical history , reviewing a separately obtained history, performing a medically appropriate examination and/or evaluation, counseling and educating the patient/family/caregiver, ordering medications, tests, or procedures, documenting clinical information in the electronic health record, and independently interpreting results (not separately reported/billed) and communicating results to the patient/family/caregiver  Belva November, MD [facility] 01/27/2024 3:29 PM    End of visit medications:  Meds ordered this encounter  Medications   levofloxacin (LEVAQUIN) 750 MG tablet    Sig: Take 1 tablet (750 mg total) by mouth daily for 7 days.    Dispense:  10 tablet    Refill:  0   predniSONE  (DELTASONE ) 50 MG tablet    Sig: Take 1 tablet (50 mg total) by mouth daily with breakfast for 5 days.    Dispense:  5 tablet    Refill:  0     Current Outpatient Medications:    acyclovir (ZOVIRAX) 400 MG tablet, Take 400 mg by mouth 2 (two) times daily as needed (Flair up)., Disp: , Rfl:    albuterol  (VENTOLIN  HFA) 108 (90 Base) MCG/ACT inhaler, Inhale 2 puffs into the lungs every 6 (six) hours as needed for wheezing or shortness of breath., Disp: ,  Rfl:    ALPRAZolam  (XANAX ) 0.5 MG tablet, Take 0.5 mg by mouth 3 (three) times daily as needed for anxiety., Disp: , Rfl:    amitriptyline  (ELAVIL ) 150 MG tablet, Take 300 mg by mouth at bedtime., Disp: , Rfl:    atorvastatin  (LIPITOR) 20 MG tablet, Take 1 tablet (20 mg total) by mouth daily., Disp: 90 tablet, Rfl: 3   budesonide -formoterol   (SYMBICORT ) 160-4.5 MCG/ACT inhaler, Inhale 2 puffs into the lungs in the morning and at bedtime., Disp: 1 each, Rfl: 12   clopidogrel  (PLAVIX ) 75 MG tablet, Take 1 tablet (75 mg total) by mouth daily., Disp: 90 tablet, Rfl: 3   docusate sodium (COLACE) 100 MG capsule, Take 100 mg by mouth daily., Disp: , Rfl:    gabapentin  (NEURONTIN ) 600 MG tablet, Take 900 mg by mouth QID., Disp: , Rfl:    LATUDA  60 MG TABS, Take 60 mg by mouth at bedtime., Disp: , Rfl:    levofloxacin (LEVAQUIN) 750 MG tablet, Take 1 tablet (750 mg total) by mouth daily for 7 days., Disp: 10 tablet, Rfl: 0   montelukast  (SINGULAIR ) 10 MG tablet, Take 1 tablet (10 mg total) by mouth at bedtime., Disp: 30 tablet, Rfl: 12   nicotine  (NICODERM CQ  - DOSED IN MG/24 HOURS) 21 mg/24hr patch, Place 1 patch (21 mg total) onto the skin daily., Disp: 42 patch, Rfl: 0   nicotine  polacrilex (NICOTINE  MINI) 2 MG lozenge, Take 1 lozenge (2 mg total) by mouth every 2 (two) hours as needed for smoking cessation., Disp: 72 lozenge, Rfl: 3   oxyCODONE -acetaminophen  (PERCOCET) 10-325 MG tablet, Take 1 tablet by mouth every 6 (six) hours as needed., Disp: , Rfl:    oxymorphone (OPANA) 10 MG tablet, Take 10 mg by mouth in the morning and at bedtime., Disp: , Rfl:    pantoprazole  (PROTONIX ) 40 MG tablet, Take 40 mg by mouth daily., Disp: , Rfl:    predniSONE  (DELTASONE ) 50 MG tablet, Take 1 tablet (50 mg total) by mouth daily with breakfast for 5 days., Disp: 5 tablet, Rfl: 0   QUEtiapine  (SEROQUEL ) 100 MG tablet, Take 100 mg by mouth at bedtime., Disp: , Rfl:    umeclidinium bromide  (INCRUSE ELLIPTA ) 62.5 MCG/ACT AEPB, Inhale 1 puff into the lungs daily., Disp: 30 each, Rfl: 12   zolpidem (AMBIEN) 10 MG tablet, Take 10 mg by mouth daily., Disp: , Rfl:    Subjective:   PATIENT ID: Courtney Mayer GENDER: female DOB: 1971/04/10, MRN: [id number]  Chief Complaint  Patient presents with   Medical Management of Chronic Issues    Cough with phlegm.  Shortness of breath on exertion and at rest. Wheezing.     HPI  Patient is a pleasant 53 year old female with a past medical history of left upper lobe lung cancer status post lobectomy and recurrent pneumonias who was recently seen by me in the hospital in consultation for finding of RML collapse on an outpatient chest CT.   She has a history of chronic hypoxic respiratory failure maintained on 2 L of oxygen via nasal cannula with exertion and with sleep.  She is also an active smoker.  She has been having recurrent pneumonias over the past year followed by her outpatient pulmonologist at [facility]. She has required multiple courses of antibiotics (levofloxacin, azithromycin , augmentin ) as well as prednisone .   She developed symptoms in August similar to her previous infections, with increased shortness of breath and productive cough. She reached out to her outpatient providers and a  repeat chest CT was ordered. CT showed atelectasis of the right middle lobe with mucous plugging versus obstructing central neoplasm as well as patchy and nodular opacities throughout the right lower lobe as well as the left lung.  There was also significant emphysema.   She was seen in the ED on 12/17/2023: She was at baseline and workup was only notable for leukocytosis with a white count of 11.2K. blood gas showed normal pH at 7.38 with mild hypercapnia at 51. She was discharged with a course of antibiotics and short course of prednisone . Plan was to undergo flexible bronchoscopy outpatient the following week but this was cancelled given clopidogrel  was not held.   Return Visit 12/31/2023:   She presents for follow up, and feels well at baseline. She has relapsed and currently smoking 1.5 packs a day. She has not had any change in symptoms, with no increase in shortness of breath, cough, or sputum production. She is not using her home inhalers.  Return Visit 01/27/2024:  She is accompanied by her mother, and both  report that the patient has been a little more confused and more lethargic over the past couple of days. She has some increased shortness of breath as well as an increase in her cough. She has had to use her oxygen more often. She is producing more sputum. She is conversational during the interview and is able to maintain her stream of thought. She reports that these are symptoms similar to how her exacerbations usually start. She did take the antibiotics previously prescribed by us  after her bronchoscopy, but didn't feel a notable difference.  The medical record also notable for admission to [facility] in May 2024 for toxic metabolic encephalopathy due to acute hypoxic and hypercapnic respiratory failure secondary to pneumonia.  At that time, she was treated with broad-spectrum antibiotics. 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 She had an AVR (bioprosthetic)   Patient is an active smoker, currently smoking between 1.5 and 2 packs/day. 80 pack year of smoking history reported.   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    Review of Systems  Constitutional:  Positive for malaise/fatigue. Negative for chills, fever and weight loss.  Respiratory:  Positive for cough, sputum production, shortness of breath and wheezing. Negative for hemoptysis.   Cardiovascular:  Negative for chest pain.  Neurological:  Positive for weakness.     Objective:   Vitals:   01/27/24 1446  BP: 90/62  Pulse: 88  Temp: 97.7 F (36.5 C)  TempSrc:  Temporal  SpO2: 92%  Weight: 113 lb 9.6 oz (51.5 kg)  Height: 5' 5 (1.651 m)   92% on RA BMI Readings from Last 3 Encounters:  01/27/24 18.90 kg/m  01/27/24 18.90 kg/m  12/29/23 18.80 kg/m   Wt Readings from Last 3 Encounters:  01/27/24 113 lb 9.6 oz (51.5 kg)  01/27/24 113 lb 9.6 oz (51.5 kg)  12/29/23 113 lb (51.3 kg)    Physical Exam Constitutional:      Appearance: Normal appearance. She is not ill-appearing.  HENT:     Head: Normocephalic and atraumatic.  Mouth/Throat:     Mouth: Mucous membranes are moist.  Cardiovascular:     Rate and Rhythm: Normal rate and regular rhythm.     Pulses: Normal pulses.     Heart sounds: Normal heart sounds.  Pulmonary:     Effort: Pulmonary effort is normal.     Breath sounds: Wheezing present.  Abdominal:     Palpations: Abdomen is soft.     Tenderness: There is no abdominal tenderness.  Musculoskeletal:     Right lower leg: No edema.     Left lower leg: No edema.  Neurological:     General: No focal deficit present.     Mental Status: She is alert and oriented to person, place, and time.       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   Cerebrovascular accident (CVA) due to occlusion of left middle cerebral artery (HCC)    COPD (chronic obstructive pulmonary disease) (HCC)    Pneumonia of right middle lobe due to infectious organism 12/17/2023     Family History  Problem Relation Age of Onset   Healthy Mother    Healthy Father      Past Surgical History:  Procedure Laterality Date   atrial valve replacement     BREAST SURGERY Right    cyst removal   BUBBLE STUDY  06/19/2021   Procedure: BUBBLE STUDY;  Surgeon: Alveta Aleene PARAS, MD;  Location: [facility] ENDOSCOPY;  Service: Cardiovascular;;   CESAREAN  SECTION     FLEXIBLE BRONCHOSCOPY Bilateral 01/05/2024   Procedure: ELLIOTT SIDE;  Surgeon: Isadora Hose, MD;  Location: [facility] ORS;  Service: Pulmonary;  Laterality: Bilateral;   LOBECTOMY     LUNG SURGERY Left    TEE WITHOUT CARDIOVERSION N/A 06/19/2021   Procedure: TRANSESOPHAGEAL ECHOCARDIOGRAM (TEE);  Surgeon: Alveta, Aleene PARAS, MD;  Location: [facility] ENDOSCOPY;  Service: Cardiovascular;  Laterality: N/A;   WRIST FRACTURE SURGERY      Social History   Socioeconomic History   Marital status: Single    Spouse name: Not on file   Number of children: Not on file   Years of education: Not on file   Highest education level: Not on file  Occupational History   Not on file  Tobacco Use   Smoking status: Every Day    Current packs/day: 2.00    Average packs/day: 2.0 packs/day for 39.7 years (79.5 ttl pk-yrs)    Types: Cigarettes    Start date: 83   Smokeless tobacco: Never  Vaping Use   Vaping status: Every Day   Substances: Nicotine   Substance and Sexual Activity   Alcohol use: Not Currently   Drug use: Yes    Types: Marijuana   Sexual activity: Not on file  Other Topics Concern   Not on file  Social History Narrative   Right handed   Caffiene none   Lives with mom,    widowed   Social Drivers of Health   Financial Resource Strain: Low Risk  (08/14/2021)   Received from [facility]   Overall Financial Resource Strain (CARDIA)    Difficulty of Paying Living Expenses: Not hard at all  Food Insecurity: No Food Insecurity (09/18/2022)   Received from [facility]   Hunger Vital Sign    Within  the past 12 months, you worried that your food would run out before you got the money to buy more.: Never true    Within the past 12 months, the food you bought just didn't last and you didn't have money to get more.: Never true  Transportation Needs: No Transportation Needs (09/18/2022)   Received from [facility]   PRAPARE - Transportation    Lack of Transportation  (Medical): No    Lack of Transportation (Non-Medical): No  Physical Activity: Unknown (08/04/2021)   Received from [facility]   Exercise Vital Sign    On average, how many days per week do you engage in moderate to strenuous exercise (like a brisk walk)?: 0 days    Minutes of Exercise per Session: Not on file  Stress: Stress Concern Present (09/18/2022)   Received from [facility] - Occupational Stress Questionnaire    Feeling of Stress : To some extent  Social Connections: Somewhat Isolated (08/04/2021)   Received from [facility]   Social Network    How would you rate your social network (family, work, friends)?: Restricted participation with some degree of social isolation  Intimate Partner Violence: Not At Risk (09/18/2022)   Received from [facility]   HITS    Over the last 12 months how often did your partner physically hurt you?: Never    Over the last 12 months how often did your partner insult you or talk down to you?: Never    Over the last 12 months how often did your partner threaten you with physical harm?: Never    Over the last 12 months how often did your partner scream or curse at you?: Never     Allergies  Allergen Reactions   Tramadol Palpitations    Can't take Ultram ER - palpitations  Regular Tramadol is fine to take Can't take Ultram ER - palpitations  Regular Tramadol is fine to take    Amoxicillin  Diarrhea   Compazine [Prochlorperazine] Other (See Comments)    Excitability   Compazine [Prochlorperazine] Anxiety     CBC    Component Value Date/Time   WBC 11.2 (H) 12/17/2023 1500   RBC 4.26 12/17/2023 1500   HGB 13.8 12/17/2023 1500   HGB 12.7 05/30/2021 1145   HCT 42.1 12/17/2023 1500   HCT 36.8 05/30/2021 1145   PLT 462 (H) 12/17/2023 1500   PLT 479 (H) 05/30/2021 1145   MCV 98.8 12/17/2023 1500   MCV 95 05/30/2021 1145   MCH 32.4 12/17/2023 1500   MCHC 32.8 12/17/2023 1500   RDW 14.6  12/17/2023 1500   RDW 12.6 05/30/2021 1145   LYMPHSABS 3.7 05/17/2021 0139   MONOABS 0.6 05/17/2021 0139   EOSABS 0.0 05/17/2021 0139   BASOSABS 0.0 05/17/2021 0139    Pulmonary Functions Testing Results:    Latest Ref Rng & Units 01/27/2024    1:43 PM  PFT Results  FVC-Pre L 2.02   FVC-Predicted Pre % 56   FVC-Post L 2.18   FVC-Predicted Post % 61   Pre FEV1/FVC % % 65   Post FEV1/FCV % % 63   FEV1-Pre L 1.31   FEV1-Predicted Pre % 46   FEV1-Post L 1.38     Outpatient Medications Prior to Visit  Medication Sig Dispense Refill   acyclovir (ZOVIRAX) 400 MG tablet Take 400 mg by mouth 2 (two) times daily as needed (Flair up).     albuterol  (VENTOLIN  HFA) 108 (90 Base)  MCG/ACT inhaler Inhale 2 puffs into the lungs every 6 (six) hours as needed for wheezing or shortness of breath.     ALPRAZolam  (XANAX ) 0.5 MG tablet Take 0.5 mg by mouth 3 (three) times daily as needed for anxiety.     amitriptyline  (ELAVIL ) 150 MG tablet Take 300 mg by mouth at bedtime.     atorvastatin  (LIPITOR) 20 MG tablet Take 1 tablet (20 mg total) by mouth daily. 90 tablet 3   budesonide -formoterol  (SYMBICORT ) 160-4.5 MCG/ACT inhaler Inhale 2 puffs into the lungs in the morning and at bedtime. 1 each 12   clopidogrel  (PLAVIX ) 75 MG tablet Take 1 tablet (75 mg total) by mouth daily. 90 tablet 3   docusate sodium (COLACE) 100 MG capsule Take 100 mg by mouth daily.     gabapentin  (NEURONTIN ) 600 MG tablet Take 900 mg by mouth QID.     LATUDA  60 MG TABS Take 60 mg by mouth at bedtime.     montelukast  (SINGULAIR ) 10 MG tablet Take 1 tablet (10 mg total) by mouth at bedtime. 30 tablet 12   nicotine  (NICODERM CQ  - DOSED IN MG/24 HOURS) 21 mg/24hr patch Place 1 patch (21 mg total) onto the skin daily. 42 patch 0   nicotine  polacrilex (NICOTINE  MINI) 2 MG lozenge Take 1 lozenge (2 mg total) by mouth every 2 (two) hours as needed for smoking cessation. 72 lozenge 3   oxyCODONE -acetaminophen  (PERCOCET) 10-325 MG tablet  Take 1 tablet by mouth every 6 (six) hours as needed.     oxymorphone (OPANA) 10 MG tablet Take 10 mg by mouth in the morning and at bedtime.     pantoprazole  (PROTONIX ) 40 MG tablet Take 40 mg by mouth daily.     QUEtiapine  (SEROQUEL ) 100 MG tablet Take 100 mg by mouth at bedtime.     umeclidinium bromide  (INCRUSE ELLIPTA ) 62.5 MCG/ACT AEPB Inhale 1 puff into the lungs daily. 30 each 12   zolpidem (AMBIEN) 10 MG tablet Take 10 mg by mouth daily.     No facility-administered medications prior to visit.

## 2024-01-27 NOTE — Progress Notes (Signed)
 Pre/Post spirometry performed today. Pt O2 when pt arrived was 86% but O2 eventually came back up to 90%. Pt also complained that she had a hard time remembering things.

## 2024-01-27 NOTE — Patient Instructions (Signed)
Pre/Post spirometry performed today.

## 2024-01-31 LAB — FUNGUS CULTURE RESULT

## 2024-01-31 LAB — FUNGUS CULTURE WITH STAIN

## 2024-01-31 LAB — FUNGAL ORGANISM REFLEX

## 2024-01-31 IMAGING — CT CT HEART MORP W/ CTA COR W/ SCORE W/ CA W/CM &/OR W/O CM
1 series · 3 of 5 positions shown, 4 images · IV contrast (omnipaque)
Comparison: CT chest 01/15/2021

Addendum:
HISTORY: 50 yo female with mass on the RCC of the AV.

EXAM:
Cardiac/Coronary CTA
TECHNIQUE: The patient was scanned on a Siemens Force scanner.
PROTOCOL: A 120 kV prospective scan was triggered in the descending thoracic
aorta at 111 HU's. Axial non-contrast 3 mm slices were carried out
through the heart. The data set was analyzed on a dedicated work
station and scored using the Agatson method. Gantry rotation speed
was 250 msecs and collimation was .6 mm. Beta blockade and 0.8 mg of
sl NTG was given. The 3D data set was reconstructed in 5% intervals
of the 35-75 % of the R-R cycle. Diastolic phases were analyzed on a
dedicated work station using MPR, MIP and VRT modes. The patient
received 95mL OMNIPAQUE IOHEXOL 350 MG/ML SOLN of contrast.

[Series 2651: mass on the rcc · 3 of 5 slices shown, 4 images]
[im 2/5  vessel]
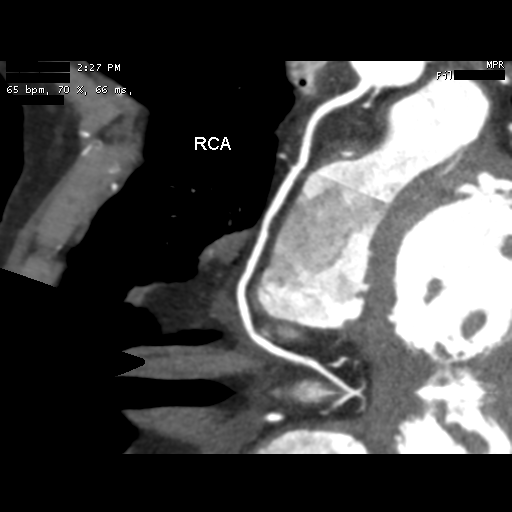
[im 2/5  lung]
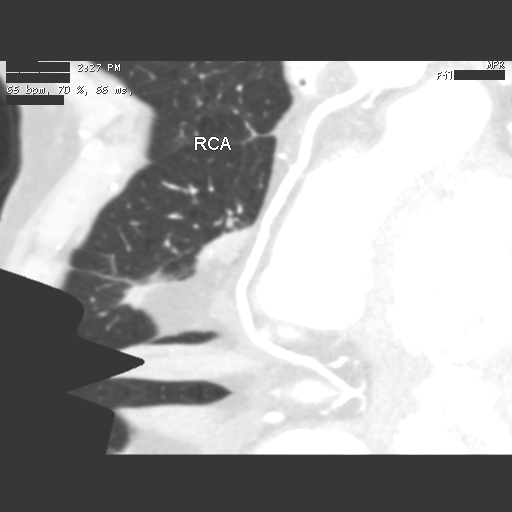
[im 3/5  vessel]
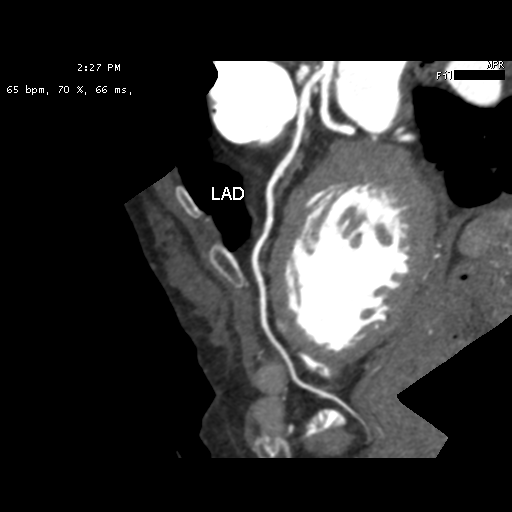
[im 4/5  vessel]
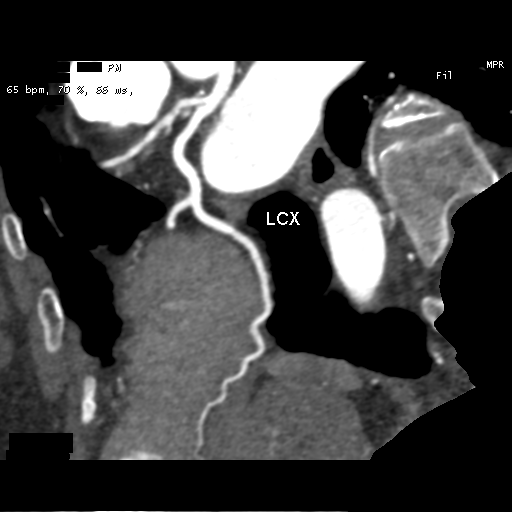

[3 of 5 positions shown; findings below may reference images not displayed]

FINDINGS: Quality: Good, HR 65

Coronary calcium score: The patient's coronary artery calcium score
is 25.2, which places the patient in the 91st percentile.

Coronary arteries: Normal coronary origins.  Right dominance.

Right Coronary Artery: Dominant. Minimal 1-24% non-calcified
stenosis of the mid-vessel (2QXCQX01).

Left Main Coronary Artery: Normal. Bifurcates into the LAD and LCX
arteries.

Left Anterior Descending Coronary Artery: Large anterior artery that
wraps around the apex. There is minimal 1-24% mixed stenosis
(2QXCQX01). 2 smaller diagonal branches without disease.

Left Circumflex Artery: Large AV groove vessel without disease.
There is a large mid-vessel OM1 branch and smaller OM2 branch
without disease.

Aorta: Normal size, 30 mm at the mid ascending aorta (level of the
PA bifurcation) measured double oblique. No calcifications. No
dissection.

Aortic Valve: Trileaflet. No calcifications. 4.6 x 4.4 mm round mass
noted adherent to the right coronary cusp (100 HU), suggestive of
fibroelastoma.

Other findings:

Normal pulmonary vein drainage into the left atrium.

Normal left atrial appendage without a thrombus.

Normal size of the pulmonary artery.
IMPRESSION: 1. Minimal mixed non-obstructive CAD, CADRADS = 1.

2. Coronary calcium score of 25.2. This was 91st percentile for age
and sex matched control.

3. Normal coronary origin with right dominance.

4. 4.6 x 4.4 mm round mass noted adherent to the right coronary cusp
(100 HU), suggestive of fibroelastoma.

EXAM:
OVER-READ INTERPRETATION  CT CHEST

The following report is an over-read performed by radiologist Dr.
over-read does not include interpretation of cardiac or coronary
anatomy or pathology. The CTA interpretation by the cardiologist is
attached.
FINDINGS: Limited view of the lung parenchyma demonstrates scattered
centrilobular emphysema. regional ground-glass density in the LEFT
lower lobe. There are bands of pleuroparenchymal thickening the LEFT
lower lobe. Airways are normal.

Limited view of the mediastinum demonstrates no adenopathy.
Esophagus normal.

Limited view of the upper abdomen unremarkable.

Limited view of the skeleton and chest wall is unremarkable.
IMPRESSION: 1. Increased ground-glass density and parenchymal bands in the LEFT
lower lobe are favored related atelectasis.
2. Centrilobular emphysema.

*** End of Addendum ***
FINDINGS: Quality: Good, HR 65

Coronary calcium score: The patient's coronary artery calcium score
is 25.2, which places the patient in the 91st percentile.

Coronary arteries: Normal coronary origins.  Right dominance.

Right Coronary Artery: Dominant. Minimal 1-24% non-calcified
stenosis of the mid-vessel (2QXCQX01).

Left Main Coronary Artery: Normal. Bifurcates into the LAD and LCX
arteries.

Left Anterior Descending Coronary Artery: Large anterior artery that
wraps around the apex. There is minimal 1-24% mixed stenosis
(2QXCQX01). 2 smaller diagonal branches without disease.

Left Circumflex Artery: Large AV groove vessel without disease.
There is a large mid-vessel OM1 branch and smaller OM2 branch
without disease.

Aorta: Normal size, 30 mm at the mid ascending aorta (level of the
PA bifurcation) measured double oblique. No calcifications. No
dissection.

Aortic Valve: Trileaflet. No calcifications. 4.6 x 4.4 mm round mass
noted adherent to the right coronary cusp (100 HU), suggestive of
fibroelastoma.

Other findings:

Normal pulmonary vein drainage into the left atrium.

Normal left atrial appendage without a thrombus.

Normal size of the pulmonary artery.
IMPRESSION: 1. Minimal mixed non-obstructive CAD, CADRADS = 1.

2. Coronary calcium score of 25.2. This was 91st percentile for age
and sex matched control.

3. Normal coronary origin with right dominance.

4. 4.6 x 4.4 mm round mass noted adherent to the right coronary cusp
(100 HU), suggestive of fibroelastoma.

## 2024-02-02 ENCOUNTER — Inpatient Hospital Stay

## 2024-02-02 ENCOUNTER — Other Ambulatory Visit: Payer: Self-pay

## 2024-02-02 ENCOUNTER — Ambulatory Visit: Payer: Self-pay | Admitting: Student in an Organized Health Care Education/Training Program

## 2024-02-02 ENCOUNTER — Inpatient Hospital Stay
Admission: EM | Admit: 2024-02-02 | Discharge: 2024-02-05 | DRG: 871 | Disposition: A | Discharge status: Home / Self Care | Attending: Internal Medicine | Admitting: Internal Medicine

## 2024-02-02 ENCOUNTER — Emergency Department

## 2024-02-02 DIAGNOSIS — Z7951 Long term (current) use of inhaled steroids: Secondary | ICD-10-CM

## 2024-02-02 DIAGNOSIS — I340 Nonrheumatic mitral (valve) insufficiency: Secondary | ICD-10-CM | POA: Diagnosis present

## 2024-02-02 DIAGNOSIS — J441 Chronic obstructive pulmonary disease with (acute) exacerbation: Principal | ICD-10-CM | POA: Diagnosis present

## 2024-02-02 DIAGNOSIS — J690 Pneumonitis due to inhalation of food and vomit: Secondary | ICD-10-CM | POA: Diagnosis present

## 2024-02-02 DIAGNOSIS — Z8673 Personal history of transient ischemic attack (TIA), and cerebral infarction without residual deficits: Secondary | ICD-10-CM

## 2024-02-02 DIAGNOSIS — Z902 Acquired absence of lung [part of]: Secondary | ICD-10-CM | POA: Diagnosis not present

## 2024-02-02 DIAGNOSIS — F411 Generalized anxiety disorder: Secondary | ICD-10-CM | POA: Diagnosis present

## 2024-02-02 DIAGNOSIS — Z880 Allergy status to penicillin: Secondary | ICD-10-CM | POA: Diagnosis not present

## 2024-02-02 DIAGNOSIS — Z1152 Encounter for screening for COVID-19: Secondary | ICD-10-CM

## 2024-02-02 DIAGNOSIS — F17210 Nicotine dependence, cigarettes, uncomplicated: Secondary | ICD-10-CM | POA: Diagnosis present

## 2024-02-02 DIAGNOSIS — J189 Pneumonia, unspecified organism: Secondary | ICD-10-CM | POA: Diagnosis present

## 2024-02-02 DIAGNOSIS — Z888 Allergy status to other drugs, medicaments and biological substances status: Secondary | ICD-10-CM | POA: Diagnosis not present

## 2024-02-02 DIAGNOSIS — Z716 Tobacco abuse counseling: Secondary | ICD-10-CM

## 2024-02-02 DIAGNOSIS — A419 Sepsis, unspecified organism: Principal | ICD-10-CM | POA: Diagnosis present

## 2024-02-02 DIAGNOSIS — J9621 Acute and chronic respiratory failure with hypoxia: Secondary | ICD-10-CM | POA: Diagnosis present

## 2024-02-02 DIAGNOSIS — Z953 Presence of xenogenic heart valve: Secondary | ICD-10-CM | POA: Diagnosis not present

## 2024-02-02 DIAGNOSIS — G8929 Other chronic pain: Secondary | ICD-10-CM | POA: Diagnosis present

## 2024-02-02 DIAGNOSIS — Z23 Encounter for immunization: Secondary | ICD-10-CM | POA: Diagnosis not present

## 2024-02-02 DIAGNOSIS — J439 Emphysema, unspecified: Secondary | ICD-10-CM | POA: Diagnosis present

## 2024-02-02 DIAGNOSIS — Z86711 Personal history of pulmonary embolism: Secondary | ICD-10-CM

## 2024-02-02 DIAGNOSIS — Z7902 Long term (current) use of antithrombotics/antiplatelets: Secondary | ICD-10-CM | POA: Diagnosis not present

## 2024-02-02 DIAGNOSIS — F329 Major depressive disorder, single episode, unspecified: Secondary | ICD-10-CM | POA: Diagnosis present

## 2024-02-02 DIAGNOSIS — I270 Primary pulmonary hypertension: Secondary | ICD-10-CM | POA: Diagnosis not present

## 2024-02-02 DIAGNOSIS — Z85118 Personal history of other malignant neoplasm of bronchus and lung: Secondary | ICD-10-CM

## 2024-02-02 DIAGNOSIS — Z79899 Other long term (current) drug therapy: Secondary | ICD-10-CM

## 2024-02-02 DIAGNOSIS — K224 Dyskinesia of esophagus: Secondary | ICD-10-CM | POA: Diagnosis present

## 2024-02-02 DIAGNOSIS — R739 Hyperglycemia, unspecified: Secondary | ICD-10-CM | POA: Diagnosis present

## 2024-02-02 DIAGNOSIS — K219 Gastro-esophageal reflux disease without esophagitis: Secondary | ICD-10-CM | POA: Diagnosis present

## 2024-02-02 LAB — D-DIMER, QUANTITATIVE: D-Dimer, Quant: 0.8 ug{FEU}/mL — ABNORMAL HIGH (ref 0.00–0.50)

## 2024-02-02 LAB — URINALYSIS, W/ REFLEX TO CULTURE (INFECTION SUSPECTED)
Bacteria, UA: NONE SEEN
Bilirubin Urine: NEGATIVE
Glucose, UA: NEGATIVE mg/dL
Hgb urine dipstick: NEGATIVE
Ketones, ur: NEGATIVE mg/dL
Leukocytes,Ua: NEGATIVE
Nitrite: NEGATIVE
Protein, ur: NEGATIVE mg/dL
RBC / HPF: 0 RBC/hpf (ref 0–5)
Specific Gravity, Urine: 1.029 (ref 1.005–1.030)
pH: 5 (ref 5.0–8.0)

## 2024-02-02 LAB — STREP PNEUMONIAE URINARY ANTIGEN: Strep Pneumo Urinary Antigen: NEGATIVE

## 2024-02-02 LAB — BASIC METABOLIC PANEL WITH GFR
Anion gap: 11 (ref 5–15)
BUN: 13 mg/dL (ref 6–20)
CO2: 27 mmol/L (ref 22–32)
Calcium: 9 mg/dL (ref 8.9–10.3)
Chloride: 99 mmol/L (ref 98–111)
Creatinine, Ser: 0.98 mg/dL (ref 0.44–1.00)
GFR, Estimated: >60 mL/min (ref >60)
Glucose, Bld: 112 mg/dL — ABNORMAL HIGH (ref 70–99)
Potassium: 4.4 mmol/L (ref 3.5–5.1)
Sodium: 137 mmol/L (ref 135–145)

## 2024-02-02 LAB — CBC
HCT: 37.1 % (ref 36.0–46.0)
Hemoglobin: 12 g/dL (ref 12.0–15.0)
MCH: 32 pg (ref 26.0–34.0)
MCHC: 32.3 g/dL (ref 30.0–36.0)
MCV: 98.9 fL (ref 80.0–100.0)
Platelets: 456 K/uL — ABNORMAL HIGH (ref 150–400)
RBC: 3.75 MIL/uL — ABNORMAL LOW (ref 3.87–5.11)
RDW: 15.2 % (ref 11.5–15.5)
WBC: 22.4 K/uL — ABNORMAL HIGH (ref 4.0–10.5)
nRBC: 0 % (ref 0.0–0.2)

## 2024-02-02 LAB — MRSA NEXT GEN BY PCR, NASAL: MRSA by PCR Next Gen: NOT DETECTED

## 2024-02-02 LAB — RESP PANEL BY RT-PCR (RSV, FLU A&B, COVID)  RVPGX2
Influenza A by PCR: NEGATIVE
Influenza B by PCR: NEGATIVE
Resp Syncytial Virus by PCR: NEGATIVE
SARS Coronavirus 2 by RT PCR: NEGATIVE

## 2024-02-02 LAB — LACTIC ACID, PLASMA: Lactic Acid, Venous: 0.7 mmol/L (ref 0.5–1.9)

## 2024-02-02 LAB — PROCALCITONIN: Procalcitonin: <0.1 ng/mL

## 2024-02-02 LAB — PROTIME-INR
INR: 1 (ref 0.8–1.2)
Prothrombin Time: 14 s (ref 11.4–15.2)

## 2024-02-02 LAB — HCG, QUANTITATIVE, PREGNANCY: hCG, Beta Chain, Quant, S: 1 m[IU]/mL (ref <5)

## 2024-02-02 MED ORDER — LURASIDONE HCL 20 MG PO TABS
60.0000 mg | ORAL_TABLET | Freq: Every day | ORAL | Status: DC
Start: 2024-02-02 — End: 2024-02-05
  Administered 2024-02-02 – 2024-02-04 (×3): 60 mg via ORAL
  Filled 2024-02-02 (×4): qty 3

## 2024-02-02 MED ORDER — PREDNISONE 20 MG PO TABS
40.0000 mg | ORAL_TABLET | Freq: Every day | ORAL | Status: DC
  Administered 2024-02-02 – 2024-02-05 (×4): 40 mg via ORAL
  Filled 2024-02-02 (×4): qty 2

## 2024-02-02 MED ORDER — ATORVASTATIN CALCIUM 20 MG PO TABS
20.0000 mg | ORAL_TABLET | Freq: Every day | ORAL | Status: DC
  Administered 2024-02-02 – 2024-02-05 (×4): 20 mg via ORAL
  Filled 2024-02-02 (×4): qty 1

## 2024-02-02 MED ORDER — ALPRAZOLAM 0.5 MG PO TABS
0.5000 mg | ORAL_TABLET | Freq: Three times a day (TID) | ORAL | Status: DC | PRN
  Administered 2024-02-03 – 2024-02-04 (×3): 0.5 mg via ORAL
  Filled 2024-02-02 (×3): qty 1

## 2024-02-02 MED ORDER — UMECLIDINIUM BROMIDE 62.5 MCG/ACT IN AEPB
1.0000 | INHALATION_SPRAY | Freq: Every day | RESPIRATORY_TRACT | Status: DC
  Administered 2024-02-02: 1 via RESPIRATORY_TRACT
  Filled 2024-02-02: qty 7

## 2024-02-02 MED ORDER — ENOXAPARIN SODIUM 40 MG/0.4ML IJ SOSY
40.0000 mg | PREFILLED_SYRINGE | INTRAMUSCULAR | Status: DC
  Administered 2024-02-02 – 2024-02-04 (×3): 40 mg via SUBCUTANEOUS
  Filled 2024-02-02 (×3): qty 0.4

## 2024-02-02 MED ORDER — OXYCODONE-ACETAMINOPHEN 10-325 MG PO TABS
1.0000 | ORAL_TABLET | Freq: Four times a day (QID) | ORAL | Status: DC | PRN
  Filled 2024-02-02 (×2): qty 1

## 2024-02-02 MED ORDER — GABAPENTIN 300 MG PO CAPS
900.0000 mg | ORAL_CAPSULE | Freq: Four times a day (QID) | ORAL | Status: DC
Start: 2024-02-02 — End: 2024-02-05
  Administered 2024-02-02 – 2024-02-05 (×11): 900 mg via ORAL
  Filled 2024-02-02 (×11): qty 3

## 2024-02-02 MED ORDER — SODIUM CHLORIDE 0.9 % IV SOLN
2.0000 g | Freq: Once | INTRAVENOUS | Status: AC
  Administered 2024-02-02: 2 g via INTRAVENOUS
  Filled 2024-02-02: qty 12.5

## 2024-02-02 MED ORDER — NICOTINE POLACRILEX 2 MG MT GUM: 2.0000 mg | CHEWING_GUM | OROMUCOSAL | Status: DC | PRN

## 2024-02-02 MED ORDER — SODIUM CHLORIDE 0.9 % IV SOLN
500.0000 mg | INTRAVENOUS | Status: DC
  Administered 2024-02-03 – 2024-02-04 (×2): 500 mg via INTRAVENOUS
  Filled 2024-02-02 (×3): qty 5

## 2024-02-02 MED ORDER — CLOPIDOGREL BISULFATE 75 MG PO TABS
75.0000 mg | ORAL_TABLET | Freq: Every day | ORAL | Status: DC
  Administered 2024-02-02 – 2024-02-05 (×4): 75 mg via ORAL
  Filled 2024-02-02 (×4): qty 1

## 2024-02-02 MED ORDER — IOHEXOL 350 MG/ML SOLN
75.0000 mL | Freq: Once | INTRAVENOUS | Status: AC | PRN
  Administered 2024-02-02: 75 mL via INTRAVENOUS

## 2024-02-02 MED ORDER — LACTATED RINGERS IV BOLUS
500.0000 mL | Freq: Once | INTRAVENOUS | Status: AC
  Administered 2024-02-02: 500 mL via INTRAVENOUS

## 2024-02-02 MED ORDER — PANTOPRAZOLE SODIUM 40 MG PO TBEC
40.0000 mg | DELAYED_RELEASE_TABLET | Freq: Every day | ORAL | Status: DC
  Administered 2024-02-03 – 2024-02-05 (×3): 40 mg via ORAL
  Filled 2024-02-02 (×3): qty 1

## 2024-02-02 MED ORDER — IPRATROPIUM-ALBUTEROL 0.5-2.5 (3) MG/3ML IN SOLN
3.0000 mL | Freq: Four times a day (QID) | RESPIRATORY_TRACT | Status: DC | PRN
  Administered 2024-02-03: 3 mL via RESPIRATORY_TRACT

## 2024-02-02 MED ORDER — OXYMORPHONE HCL 10 MG PO TABS: 10.0000 mg | ORAL_TABLET | Freq: Two times a day (BID) | ORAL | Status: DC

## 2024-02-02 MED ORDER — NICOTINE 14 MG/24HR TD PT24: 14.0000 mg | MEDICATED_PATCH | Freq: Once | TRANSDERMAL | Status: DC

## 2024-02-02 MED ORDER — ZOLPIDEM TARTRATE 5 MG PO TABS
5.0000 mg | ORAL_TABLET | Freq: Every day | ORAL | Status: DC
  Administered 2024-02-02 – 2024-02-04 (×3): 5 mg via ORAL
  Filled 2024-02-02 (×3): qty 1

## 2024-02-02 MED ORDER — FLUTICASONE FUROATE-VILANTEROL 200-25 MCG/ACT IN AEPB
1.0000 | INHALATION_SPRAY | Freq: Every day | RESPIRATORY_TRACT | Status: DC
  Administered 2024-02-03: 1 via RESPIRATORY_TRACT
  Filled 2024-02-02: qty 28

## 2024-02-02 MED ORDER — IPRATROPIUM-ALBUTEROL 0.5-2.5 (3) MG/3ML IN SOLN
3.0000 mL | Freq: Once | RESPIRATORY_TRACT | Status: AC
  Administered 2024-02-02: 3 mL via RESPIRATORY_TRACT
  Filled 2024-02-02: qty 3

## 2024-02-02 MED ORDER — OXYCODONE-ACETAMINOPHEN 5-325 MG PO TABS
2.0000 | ORAL_TABLET | Freq: Four times a day (QID) | ORAL | Status: DC | PRN
  Administered 2024-02-02 – 2024-02-05 (×7): 2 via ORAL
  Filled 2024-02-02 (×7): qty 2

## 2024-02-02 MED ORDER — VANCOMYCIN HCL 1250 MG/250ML IV SOLN
1250.0000 mg | Freq: Once | INTRAVENOUS | Status: AC
  Administered 2024-02-02: 1250 mg via INTRAVENOUS
  Filled 2024-02-02: qty 250

## 2024-02-02 MED ORDER — SODIUM CHLORIDE 0.9 % IV SOLN
500.0000 mg | Freq: Once | INTRAVENOUS | Status: AC
  Administered 2024-02-02: 500 mg via INTRAVENOUS
  Filled 2024-02-02: qty 5

## 2024-02-02 MED ORDER — MONTELUKAST SODIUM 10 MG PO TABS
10.0000 mg | ORAL_TABLET | Freq: Every day | ORAL | Status: DC
  Administered 2024-02-02: 10 mg via ORAL
  Filled 2024-02-02: qty 1

## 2024-02-02 MED ORDER — AMITRIPTYLINE HCL 50 MG PO TABS
300.0000 mg | ORAL_TABLET | Freq: Every day | ORAL | Status: DC
  Administered 2024-02-02 – 2024-02-04 (×3): 300 mg via ORAL
  Filled 2024-02-02: qty 6
  Filled 2024-02-02: qty 3
  Filled 2024-02-02: qty 6
  Filled 2024-02-02: qty 3
  Filled 2024-02-02: qty 6

## 2024-02-02 MED ORDER — SODIUM CHLORIDE 0.9 % IV SOLN
2.0000 g | Freq: Two times a day (BID) | INTRAVENOUS | Status: DC
  Administered 2024-02-03: 2 g via INTRAVENOUS
  Filled 2024-02-02: qty 12.5

## 2024-02-02 MED ORDER — VANCOMYCIN HCL IN DEXTROSE 1-5 GM/200ML-% IV SOLN: 1000.0000 mg | INTRAVENOUS | Status: DC

## 2024-02-02 MED ORDER — NICOTINE 21 MG/24HR TD PT24
21.0000 mg | MEDICATED_PATCH | TRANSDERMAL | Status: DC
  Administered 2024-02-02 – 2024-02-04 (×3): 21 mg via TRANSDERMAL
  Filled 2024-02-02 (×3): qty 1

## 2024-02-02 MED ORDER — METHYLPREDNISOLONE SODIUM SUCC 125 MG IJ SOLR
125.0000 mg | INTRAMUSCULAR | Status: AC
  Administered 2024-02-02: 125 mg via INTRAVENOUS
  Filled 2024-02-02: qty 2

## 2024-02-02 MED ORDER — DOCUSATE SODIUM 100 MG PO CAPS
100.0000 mg | ORAL_CAPSULE | Freq: Every day | ORAL | Status: DC
  Administered 2024-02-02 – 2024-02-05 (×4): 100 mg via ORAL
  Filled 2024-02-02 (×4): qty 1

## 2024-02-02 NOTE — Telephone Encounter (Signed)
 FYI Only or Action Required?: Action required by provider: update on patient condition.  Patient is followed in Pulmonology for COPD, last seen on 01/27/2024 by Isadora Hose, MD.  Called Nurse Triage reporting Shortness of Breath.  Symptoms began about a month ago.  Symptoms are: gradually worsening.  Triage Disposition: Go to ED Now (Notify PCP)  Patient/caregiver understands and will follow disposition?: Yes       Copied from CRM [id number]. Topic: Clinical - Red Word Triage >> Feb 02, 2024 11:08 AM Courtney Mayer wrote: Red Word that prompted transfer to Nurse Triage: Pt was seen on 01/27/2024 by Dr. Isadora in [place] for pneumonia, but has since then got worse. Pt is experiencing chest tightness, wheezing, congestion, coughing, loss of voice, and white bumps in throat.   Pt wants to be seen today if possible or talk with a nurse about being seen at a hospital.   Pt's phone number is [phone].       Reason for Disposition  Oxygen level (e.g., pulse oximetry) 90% or lower  Answer Assessment - Initial Assessment Questions Patient states she is going to [facility] and would like to know if Dr. Isadora would be able to see her in the hospital. Please advise.       1. RESPIRATORY STATUS: Describe your breathing? (e.g., wheezing, shortness of breath, unable to speak, severe coughing)      Shortness of breath  2. ONSET: When did this breathing problem begin?      About a month  3. PATTERN Does the difficult breathing come and go, or has it been constant since it started?      Intermittent  4. SEVERITY: How bad is your breathing? (e.g., mild, moderate, severe)      Moderate, improved from yesterday but still worse than when she was seen 5. RECURRENT SYMPTOM: Have you had difficulty breathing before? If Yes, ask: When was the last time? and What happened that time?      Yes 6. CARDIAC HISTORY: Do you have any history of heart disease? (e.g., heart  attack, angina, bypass surgery, angioplasty)      No 7. LUNG HISTORY: Do you have any history of lung disease?  (e.g., pulmonary embolus, asthma, emphysema)     Yes 8. CAUSE: What do you think is causing the breathing problem?      Recently had pneumonia  9. OTHER SYMPTOMS: Do you have any other symptoms? (e.g., chest pain, cough, dizziness, fever, runny nose)     Congestion, cough, white bumps in throat, chest tightness, wheezing 10. O2 SATURATION MONITOR:  Do you use an oxygen saturation monitor (pulse oximeter) at home? If Yes, ask: What is your reading (oxygen level) today? What is your usual oxygen saturation reading? (e.g., 95%)       79%  Protocols used: Breathing Difficulty-A-AH

## 2024-02-02 NOTE — Sepsis Progress Note (Signed)
 Elink monitoring for the code sepsis protocol.

## 2024-02-02 NOTE — Progress Notes (Signed)
 ED Pharmacy Antibiotic Sign Off An antibiotic consult was received from an ED provider for vancomycin  and cefepime  per pharmacy dosing for sepsis. A chart review was completed to assess appropriateness.   The following one time order(s) were placed:  Cefepime  2 g IV x 1 Vanc 1.25 g IV x 1  Further antibiotic and/or antibiotic pharmacy consults should be ordered by the admitting provider if indicated.   Thank you for allowing pharmacy to be a part of this patient's care.   Lum VEAR Mania, [facility]  Clinical Pharmacist 02/02/24 2:16 PM

## 2024-02-02 NOTE — ED Triage Notes (Signed)
 Patient's mother states patient has recently been treated for Pneumonia, shortness of breath has not improved. Baseline 2L but mom states patient's oxygen has been dropping to low 90s at home.

## 2024-02-02 NOTE — H&P (Addendum)
 History and Physical    Patient: Courtney Mayer FMW:[id number] DOB: 1971-02-16 DOA: 02/02/2024 DOS: the patient was seen and examined on 02/02/2024 PCP: Lauretta Senior, MD  Patient coming from: Home  Chief Complaint:  Chief Complaint  Patient presents with   Shortness of Breath   HPI: Courtney Mayer is a 53 y.o. 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  Denies chest pain, palpitations, abdominal pain. patient has been recently treated for pneumonia, was seen by pulmonary Dr.Dgayli on 01/27/24, was being treated with Levaquin, prednisone , nebulizers for recent right middle lobe pneumonia  Presentation to ED found to be afebrile, RR 22, HR 98, blood pressure borderline low 90/73, saturating 93% on 4 L [place] Workup revealed BMP unremarkable except mild hyperglycemia, CBC with wbc 22.4, Hb 12.0, platelet count 456. LA not elevated D-dimer elevated Resp panel pending, Blood cultures sent in ER CXR shows improving bibasilar opacities with decreased atelectasis at left lung base, chronic bronchitic type bronchial wall thickening persists.  Was given dose of vancomycin , cefepime  in ER.  Discussed with pulmonary, recommend broad-spectrum antibiotics covering MRSA, Pseudomonas, atypicals   Review of Systems: As mentioned in the history of present illness. All other systems reviewed and are negative. 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   Cerebrovascular accident (CVA) due to occlusion of left middle cerebral  artery (HCC)    COPD (chronic obstructive pulmonary disease) (HCC)    Pneumonia of right middle lobe due to infectious organism 12/17/2023   Past Surgical History:  Procedure Laterality Date   atrial valve replacement     BREAST SURGERY Right    cyst removal   BUBBLE STUDY  06/19/2021   Procedure: BUBBLE STUDY;  Surgeon: Alveta Aleene PARAS, MD;  Location: [facility] ENDOSCOPY;  Service: Cardiovascular;;   CESAREAN SECTION     FLEXIBLE BRONCHOSCOPY Bilateral 01/05/2024   Procedure: ELLIOTT SIDE;  Surgeon: Isadora Hose, MD;  Location: [facility] ORS;  Service: Pulmonary;  Laterality: Bilateral;   LOBECTOMY     LUNG SURGERY Left    TEE WITHOUT CARDIOVERSION N/A 06/19/2021   Procedure: TRANSESOPHAGEAL ECHOCARDIOGRAM (TEE);  Surgeon: Alveta Aleene PARAS, MD;  Location: [facility] ENDOSCOPY;  Service: Cardiovascular;  Laterality: N/A;   WRIST FRACTURE SURGERY     Social History:  reports that she has been smoking cigarettes. She started smoking about 39 years ago. She has a 79.5 pack-year smoking history. She has never used smokeless tobacco. She reports that she does not currently use alcohol. She reports current drug use. Drug: Marijuana.  Allergies  Allergen Reactions   Tramadol Palpitations    Can't take Ultram ER - palpitations  Regular Tramadol is fine to take Can't take Ultram ER - palpitations  Regular Tramadol is fine to take    Amoxicillin  Diarrhea   Compazine [Prochlorperazine] Other (See Comments)    Excitability   Compazine [Prochlorperazine] Anxiety    Family History  Problem Relation Age of Onset   Healthy Mother    Healthy Father     Prior to Admission medications  Medication Sig Start Date End Date Taking? Authorizing Provider  acyclovir (ZOVIRAX) 400 MG tablet Take 400 mg by mouth 2 (two) times daily as needed (Flair up).    [provider]  albuterol  (VENTOLIN  HFA) 108 (90 Base) MCG/ACT inhaler Inhale 2 puffs into the lungs every 6 (six) hours as needed for  wheezing or shortness of breath.    [provider]  ALPRAZolam  (XANAX ) 0.5 MG tablet Take 0.5 mg by mouth 3 (three) times daily as needed for anxiety.    [provider]  amitriptyline  (ELAVIL ) 150 MG tablet Take 300 mg by mouth at bedtime. 12/31/20   [provider]  atorvastatin  (LIPITOR) 20 MG tablet Take 1 tablet (20 mg total) by mouth daily. 05/26/23   Onita Duos, MD  budesonide -formoterol  (SYMBICORT ) 160-4.5 MCG/ACT inhaler Inhale 2 puffs into the lungs in the morning and at bedtime. 12/29/23   Isadora Hose, MD  clopidogrel  (PLAVIX ) 75 MG tablet Take 1 tablet (75 mg total) by mouth daily. 06/26/23   Onita Duos, MD  docusate sodium (COLACE) 100 MG capsule Take 100 mg by mouth daily.    [provider]  gabapentin  (NEURONTIN ) 600 MG tablet Take 900 mg by mouth QID. 01/12/21   [provider]  ipratropium-albuterol  (DUONEB) 0.5-2.5 (3) MG/3ML SOLN Take 3 mLs by nebulization every 6 (six) hours as needed. 01/27/24   Isadora Hose, MD  LATUDA  60 MG TABS Take 60 mg by mouth at bedtime. 01/03/21   [provider]  levofloxacin (LEVAQUIN) 750 MG tablet Take 1 tablet (750 mg total) by mouth daily for 7 days. 01/27/24 02/03/24  Isadora Hose, MD  montelukast  (SINGULAIR ) 10 MG tablet Take 1 tablet (10 mg total) by mouth at bedtime. 12/29/23   Isadora Hose, MD  nicotine  (NICODERM CQ  - DOSED IN MG/24 HOURS) 21 mg/24hr patch Place 1 patch (21 mg total) onto the skin daily. 12/29/23 02/09/24  Isadora Hose, MD  nicotine  polacrilex (NICOTINE  MINI) 2 MG lozenge Take 1 lozenge (2 mg total) by mouth every 2 (two) hours as needed for smoking cessation. 12/29/23 03/28/24  Isadora Hose, MD  oxyCODONE -acetaminophen  (PERCOCET) 10-325 MG tablet Take 1 tablet by mouth every 6 (six) hours as needed. 08/06/15   [provider]  oxymorphone (OPANA) 10 MG tablet Take 10 mg by mouth in the morning and at bedtime.    [provider]  pantoprazole  (PROTONIX ) 40 MG  tablet Take 40 mg by mouth daily. 01/14/21   [provider]  QUEtiapine  (SEROQUEL ) 100 MG tablet Take 100 mg by mouth at bedtime. 01/19/24   [provider]  umeclidinium bromide  (INCRUSE ELLIPTA ) 62.5 MCG/ACT AEPB Inhale 1 puff into the lungs daily. 01/11/24   Isadora Hose, MD  zolpidem (AMBIEN) 10 MG tablet Take 10 mg by mouth daily.    [provider]    Physical Exam: Vitals:   02/02/24 1237 02/02/24 1240 02/02/24 1526  BP: 90/73  95/69  Pulse: 98  98  Resp: (!) 22  20  Temp: 98.6 F (37 C)    TempSrc: Oral    SpO2: 90% 93% 91%  Weight: 54.4 kg    Height: 5' 5 (1.651 m)      General: Awake, no distress. On supplemental oxygen CV:  Tachycardic, no murmurs Resp:   Coarse breath sounds bilaterally, mild wheezing present Abd: Soft, nontender, nondistended Neuro: AO x 3, no gross focal deficit  Data Reviewed: Lab and Imaging reviewed  Assessment and Plan:  Acute on chronic respiratory  failure with hypoxia Likely pneumonia, r/o PE Hx malignant neoplasm of LUL lung s/p left upper lobectomy 2017 - was being treated with Levaquin, prednisone , nebulizers for recent right middle lobe pneumonia, presented due to worsening symptoms - Requiring 4 L , at home only on supplemental oxygen at night 2L - CXR shows improving bibasilar opacities with decreased atelectasis at left lung base, chronic bronchitic type bronchial wall thickening persists - d-dimer elevated, leukocytosis - Continue IV vancomycin , cefepime , azithromycin  - Urine Legionella, Streptococcus pneumonia antigen, UA - Pulmonary consult, notified by ER - CT PE pending - Send MRSA nares, RVP, procalcitonin - Follow respiratory culture, blood culture  COPD exacerbation - On prednisone  40mg  daily - Home inhalers, Duonebs prn  Leukocytosis  - Multifactoral likely 2/2 pneumonia, also on steroid therapy - Monitor WBC  Tobacco use - Nicotine  patches and gums  Generalized anxiety and major  depression - Home Xanax , Amitriptyline , Zolpidem  Chronic back pain - Resume home Gabapentin , Percocet, Oxymorphone       Advance Care Planning:   Code Status: Full Code discussed with patient at bedside  Consults: Pulmonary  Family Communication: Mother - at the bedside  Severity of Illness: The appropriate patient status for this patient is INPATIENT. Inpatient status is judged to be reasonable and necessary in order to provide the required intensity of service to ensure the patient's safety. The patient's presenting symptoms, physical exam findings, and initial radiographic and laboratory data in the context of their chronic comorbidities is felt to place them at high risk for further clinical deterioration. Furthermore, it is not anticipated that the patient will be medically stable for discharge from the hospital within 2 midnights of admission.   * I certify that at the point of admission it is my clinical judgment that the patient will require inpatient hospital care spanning beyond 2 midnights from the point of admission due to high intensity of service, high risk for further deterioration and high frequency of surveillance required.*  Author: Laree Lock, MD 02/02/2024 4:12 PM  For on call review [email or web address].

## 2024-02-02 NOTE — ED Notes (Signed)
 Pt's O2 saturation dropped to 78% on RA after ambulation to bathroom. Pt O2 increased to 4L via [place]. Pt is currently at 96% via 4L.

## 2024-02-02 NOTE — ED Notes (Signed)
 Assume care from Jeanna RN. Introduced self to pt. Pt ambulatory to bathroom with assistance.

## 2024-02-02 NOTE — ED Notes (Signed)
 Patient stated that she has been having difficultly with swallowing over the last few days.  I asked her was it food, water, pills and she said all of the above.

## 2024-02-02 NOTE — Progress Notes (Signed)
 Pharmacy Antibiotic Note  Courtney Mayer is a 53 y.o. female admitted on 02/02/2024 with pneumonia.  Pharmacy has been consulted for vancomycin  dosing.  Plan: Vancomycin  1.25 g IV x 1 loading dose already ordered Start vancomycin  1000 mg IV Q24H. Goal AUC 400-550. Expected AUC: 498.9 Expected Css min: 11.1 SCr used: 0.98  Weight used: TBW<IBW, Vd used: 0.72 (BMI 19.97) Patient is also on cefepime  2 g IV Q8H Patient is also on azithromycin  500 mg IV Q24H Continue to monitor renal function and follow culture results Follow up MRSA PCR for possible de-escalation   Height: 5' 5 (165.1 cm) Weight: 54.4 kg (120 lb) IBW/kg (Calculated) : 57  Temp (24hrs), Avg:98.6 F (37 C), Min:98.6 F (37 C), Max:98.6 F (37 C)  Recent Labs  Lab 02/02/24 1240 02/02/24 1433  WBC 22.4*  --   CREATININE 0.98  --   LATICACIDVEN  --  0.7    Estimated Creatinine Clearance: 56.4 mL/min (by C-G formula based on SCr of 0.98 mg/dL).    Allergies  Allergen Reactions   Tramadol Palpitations    Can't take Ultram ER - palpitations  Regular Tramadol is fine to take Can't take Ultram ER - palpitations  Regular Tramadol is fine to take    Amoxicillin  Diarrhea   Compazine [Prochlorperazine] Other (See Comments)    Excitability   Compazine [Prochlorperazine] Anxiety    Antimicrobials this admission: 10/7 Vanc>> 10/7 cefepime >> 10/7 azithromycin >>   Microbiology results: 10/7 BCx: in process 10/7 MRSA PCR: ordered  Thank you for allowing pharmacy to be a part of this patient's care.  Lum VEAR Mania, PharmD, BCPS 02/02/2024 4:26 PM

## 2024-02-02 NOTE — ED Provider Notes (Signed)
 [facility] Provider Note    Event Date/Time   First MD Initiated Contact with Patient 02/02/24 1343     (approximate)   History   Shortness of Breath   HPI  Courtney Mayer is a 53 y.o. female history of COPD.  Reviewed office note from October 1 by Dr. Isadora.  Plan to treat with Levaquin prednisone  and nebulizers during that visit   Recent right middle lobe pneumonia.  At that time acid-fast and fungal cultures were no growth.  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   Cerebrovascular accident (CVA) due to occlusion of left middle cerebral artery (HCC)    COPD (chronic obstructive pulmonary disease) (HCC)    Pneumonia of right middle lobe due to infectious organism 12/17/2023     Physical Exam   Triage Vital Signs: ED Triage Vitals [02/02/24 1237]  Encounter Vitals Group     BP 90/73     Girls Systolic BP Percentile      Girls Diastolic BP Percentile      Boys Systolic BP Percentile      Boys Diastolic BP Percentile      Pulse Rate 98     Resp (!) 22     Temp 98.6 F (37 C)     Temp Source Oral     SpO2 90 %     Weight 120 lb (54.4 kg)     Height 5' 5 (1.651 m)     Head Circumference      Peak Flow      Pain Score 0     Pain Loc      Pain Education      Exclude from Growth Chart     Most recent vital signs: Vitals:   02/02/24 1237 02/02/24 1240  BP: 90/73   Pulse: 98   Resp: (!) 22   Temp: 98.6 F (37 C)   SpO2: 90% 93%     General: Awake, no distress.  Patient is slightly dyspneic though.  Very mild accessory muscle  use.  Oxygen saturation 92% on 2 L CV:  Good peripheral perfusion.  Normal tones Resp:  Normal effort with exception to slight use of accessory muscles no tripoding or fatigue.  Speaks in phrases.  Diffuse rhonchi and slight wheezing heard in the lower lobes bilaterally occasional dry cough Abd:  No distention.  Soft nontender Other:  No lower extreme edema venous cords or congestion.  Patient has no history of pulmonary embolism or DVT.  No clinical signs or symptoms of etiology suggestive of DVT or PE.  Currently being treated for pneumonia with elevated white count, my pretest probability for PE or DVT is low.  Screening D-dimer sent.   ED Results / Procedures / Treatments   Labs (all labs ordered are listed, but only abnormal results are displayed) Labs Reviewed  BASIC METABOLIC PANEL WITH GFR - Abnormal; Notable for the following components:      Result Value   Glucose, Bld 112 (*)    All other components within normal limits  CBC - Abnormal; Notable for the following components:   WBC 22.4 (*)    RBC 3.75 (*)    Platelets 456 (*)    All other components within normal limits  CULTURE, BLOOD (ROUTINE X 2)  CULTURE, BLOOD (ROUTINE X 2)  RESP PANEL BY RT-PCR (RSV, FLU A&B, COVID)  [id number]  LACTIC ACID, PLASMA  LACTIC ACID, PLASMA  PROTIME-INR  URINALYSIS, W/ REFLEX TO CULTURE (INFECTION SUSPECTED)  HCG, QUANTITATIVE, PREGNANCY  D-DIMER, QUANTITATIVE     EKG  Anterior by me at 1245 heart rate 110 QRS 100 QTc 460 Sinus tachycardia, no evidence of frank ischemia   RADIOLOGY Chest x-ray inter by me has chronic scarring or interstitial lung disease like pattern.  I do not see any frank obvious infiltrate  DG Chest 2 View Result Date: 02/02/2024 EXAM: 2 VIEW(S) XRAY OF THE CHEST 02/02/2024 01:05:46 PM COMPARISON: Comparison exam 01/05/2024. CLINICAL HISTORY: shortness of breath. Shortness of breath shortness of breath. Shortness of breath FINDINGS: LUNGS AND PLEURA: Some  improvement in bibasilar opacities. Chronic bronchial markings remain. Improved atelectasis of the left lung base. No pulmonary edema. No pleural effusion. No pneumothorax. HEART AND MEDIASTINUM: No acute abnormality of the cardiac and mediastinal silhouettes. BONES AND SOFT TISSUES: Midline sternotomy. No acute osseous abnormality. IMPRESSION: 1. Improving bibasilar opacities with decreased atelectasis at the left lung base. 2. Chronic bronchitic-type bronchial wall thickening persists. Electronically signed by: Norleen Boxer MD 02/02/2024 02:01 PM EDT RP Workstation: [id number]    PROCEDURES:  Critical Care performed: Yes, see critical care procedure note(s)  CRITICAL CARE Performed by: Oneil Budge   Total critical care time: 30 minutes  Critical care time was exclusive of separately billable procedures and treating other patients.  Critical care was necessary to treat or prevent imminent or life-threatening deterioration.  Critical care was time spent personally by me on the following activities: development of treatment plan with patient and/or surrogate as well as nursing, discussions with consultants, evaluation of patient's response to treatment, examination of patient, obtaining history from patient or surrogate, ordering and performing treatments and interventions, ordering and review of laboratory studies, ordering and review of radiographic studies, pulse oximetry and re-evaluation of patient's condition.   Procedures   MEDICATIONS ORDERED IN ED: Medications  azithromycin  (ZITHROMAX ) 500 mg in sodium chloride  0.9 % 250 mL IVPB (has no administration in time range)  ceFEPIme  (MAXIPIME ) 2 g in sodium chloride  0.9 % 100 mL IVPB (2 g Intravenous New Bag/Given 02/02/24 1439)  vancomycin  (VANCOREADY) IVPB 1250 mg/250 mL (has no administration in time range)  lactated ringers  bolus 500 mL (has no administration in time range)  ipratropium-albuterol  (DUONEB) 0.5-2.5 (3) MG/3ML nebulizer  solution 3 mL (3 mLs Nebulization Given 02/02/24 1441)  ipratropium-albuterol  (DUONEB) 0.5-2.5 (3) MG/3ML nebulizer solution 3 mL (3 mLs Nebulization Given 02/02/24 1441)  methylPREDNISolone  sodium succinate (SOLU-MEDROL ) 125 mg/2 mL injection 125 mg (125 mg Intravenous Given 02/02/24 1436)     IMPRESSION / MDM / ASSESSMENT AND PLAN / ED COURSE  I reviewed the triage vital signs and the nursing notes.                              Differential diagnosis includes, but is not limited to, worsening pneumonia viral illness, influenza, no acute cardiac symptoms no chest pain.  Also associated wheezing suspect ongoing or exacerbation of COPD.  White count 22,000, could be related to steroid use but seems quite high and  with her ongoing symptoms she also reports a fever recurring yesterday and her ongoing cough and persistent symptoms I am very concerned about a complicated pneumonia.  I consulted with Dr. Isadora, and he is not on service today but will have Dr. Tamea see the patient, but his recommendation is followed to give vancomycin  and cefepime  and azithromycin  due to the degree of her structural lung disease.  Discussed with the patient patient agreeable understand plan for admission.  Patient's presentation is most consistent with acute presentation with potential threat to life or bodily function.   Patient meets sepsis criteria  Screening D-dimer sent to exclude PE.  Discussed with and counseled with hospital service.  Patient accepted to hospital service by Dr. Jerelene, And we did discuss and they will follow-up on the pending D-dimer, admitted proceed forward with CT imaging to further evaluate for structural lung disease pneumonia etc. as well.  Patient understand agreeable plan for admission.  Dr. Tamea provide pulmonary consult  ----------------------------------------- 3:08 PM on 02/02/2024 ----------------------------------------- Reassessment patient resting comfortably no  change in condition.  Blood pressure steady at about 90 systolic.       FINAL CLINICAL IMPRESSION(S) / ED DIAGNOSES   Final diagnoses:  COPD exacerbation (HCC)  Pneumonia of both lower lobes due to infectious organism     Rx / DC Orders   ED Discharge Orders     None        Note:  This document was prepared using Dragon voice recognition software and may include unintentional dictation errors.   Dicky Anes, MD 02/02/24 [phone]

## 2024-02-02 NOTE — Telephone Encounter (Signed)
 Noted, NFN

## 2024-02-02 NOTE — ED Notes (Signed)
 Patient swallowed pills without water and pills without issue.

## 2024-02-03 ENCOUNTER — Inpatient Hospital Stay (HOSPITAL_COMMUNITY)
Admit: 2024-02-03 | Discharge: 2024-02-03 | Disposition: A | Discharge status: Home / Self Care | Attending: Pulmonary Disease | Admitting: Pulmonary Disease

## 2024-02-03 DIAGNOSIS — J9621 Acute and chronic respiratory failure with hypoxia: Secondary | ICD-10-CM

## 2024-02-03 DIAGNOSIS — J441 Chronic obstructive pulmonary disease with (acute) exacerbation: Secondary | ICD-10-CM

## 2024-02-03 DIAGNOSIS — I270 Primary pulmonary hypertension: Secondary | ICD-10-CM | POA: Diagnosis not present

## 2024-02-03 DIAGNOSIS — J189 Pneumonia, unspecified organism: Secondary | ICD-10-CM | POA: Diagnosis not present

## 2024-02-03 DIAGNOSIS — F17210 Nicotine dependence, cigarettes, uncomplicated: Secondary | ICD-10-CM

## 2024-02-03 LAB — CBC
HCT: 37.9 % (ref 36.0–46.0)
Hemoglobin: 12 g/dL (ref 12.0–15.0)
MCH: 31.9 pg (ref 26.0–34.0)
MCHC: 31.7 g/dL (ref 30.0–36.0)
MCV: 100.8 fL — ABNORMAL HIGH (ref 80.0–100.0)
Platelets: 426 K/uL — ABNORMAL HIGH (ref 150–400)
RBC: 3.76 MIL/uL — ABNORMAL LOW (ref 3.87–5.11)
RDW: 15.3 % (ref 11.5–15.5)
WBC: 23.8 K/uL — ABNORMAL HIGH (ref 4.0–10.5)
nRBC: 0 % (ref 0.0–0.2)

## 2024-02-03 LAB — RESPIRATORY PANEL BY PCR

## 2024-02-03 LAB — COMPREHENSIVE METABOLIC PANEL WITH GFR
ALT: 32 U/L (ref 0–44)
AST: 32 U/L (ref 15–41)
Albumin: 3.2 g/dL — ABNORMAL LOW (ref 3.5–5.0)
Alkaline Phosphatase: 74 U/L (ref 38–126)
Anion gap: 11 (ref 5–15)
BUN: 11 mg/dL (ref 6–20)
CO2: 27 mmol/L (ref 22–32)
Calcium: 8.9 mg/dL (ref 8.9–10.3)
Chloride: 100 mmol/L (ref 98–111)
Creatinine, Ser: 0.76 mg/dL (ref 0.44–1.00)
GFR, Estimated: >60 mL/min (ref >60)
Glucose, Bld: 114 mg/dL — ABNORMAL HIGH (ref 70–99)
Potassium: 4.2 mmol/L (ref 3.5–5.1)
Sodium: 138 mmol/L (ref 135–145)
Total Bilirubin: 0.5 mg/dL (ref 0.0–1.2)
Total Protein: 7.7 g/dL (ref 6.5–8.1)

## 2024-02-03 LAB — ECHOCARDIOGRAM COMPLETE
AR max vel: 1.24 cm2
AV Area VTI: 1.27 cm2
AV Area mean vel: 1.22 cm2
AV Mean grad: 16 mmHg
AV Peak grad: 28 mmHg
Ao pk vel: 2.65 m/s
Area-P 1/2: 4.29 cm2
Height: 65 in
S' Lateral: 2.8 cm
Weight: 1920 [oz_av]

## 2024-02-03 LAB — SEDIMENTATION RATE: Sed Rate: 82 mm/h — ABNORMAL HIGH (ref 0–30)

## 2024-02-03 MED ORDER — SENNA 8.6 MG PO TABS
2.0000 | ORAL_TABLET | Freq: Every evening | ORAL | Status: DC | PRN
  Administered 2024-02-03: 17.2 mg via ORAL
  Filled 2024-02-03: qty 2

## 2024-02-03 MED ORDER — BUDESONIDE 0.25 MG/2ML IN SUSP
0.2500 mg | Freq: Two times a day (BID) | RESPIRATORY_TRACT | Status: AC
  Administered 2024-02-03 – 2024-02-04 (×4): 0.25 mg via RESPIRATORY_TRACT
  Filled 2024-02-03 (×4): qty 2

## 2024-02-03 MED ORDER — VANCOMYCIN HCL 1250 MG/250ML IV SOLN: 1250.0000 mg | INTRAVENOUS | Status: DC

## 2024-02-03 MED ORDER — ARFORMOTEROL TARTRATE 15 MCG/2ML IN NEBU
15.0000 ug | INHALATION_SOLUTION | Freq: Two times a day (BID) | RESPIRATORY_TRACT | Status: AC
  Administered 2024-02-03 – 2024-02-04 (×4): 15 ug via RESPIRATORY_TRACT
  Filled 2024-02-03 (×4): qty 2

## 2024-02-03 MED ORDER — INFLUENZA VIRUS VACC SPLIT PF (FLUZONE) 0.5 ML IM SUSY
0.5000 mL | PREFILLED_SYRINGE | INTRAMUSCULAR | Status: AC
Start: 2024-02-04 — End: 2024-02-05
  Administered 2024-02-05: 0.5 mL via INTRAMUSCULAR
  Filled 2024-02-03: qty 0.5

## 2024-02-03 MED ORDER — MORPHINE SULFATE ER 15 MG PO TBCR
30.0000 mg | EXTENDED_RELEASE_TABLET | Freq: Two times a day (BID) | ORAL | Status: DC
  Administered 2024-02-03 – 2024-02-05 (×5): 30 mg via ORAL
  Filled 2024-02-03 (×5): qty 2

## 2024-02-03 MED ORDER — SODIUM CHLORIDE 0.9 % IV SOLN
2.0000 g | Freq: Three times a day (TID) | INTRAVENOUS | Status: DC
  Administered 2024-02-03 – 2024-02-05 (×7): 2 g via INTRAVENOUS
  Filled 2024-02-03 (×8): qty 12.5

## 2024-02-03 MED ORDER — ALBUTEROL SULFATE (2.5 MG/3ML) 0.083% IN NEBU: 2.5000 mg | INHALATION_SOLUTION | Freq: Four times a day (QID) | RESPIRATORY_TRACT | Status: DC | PRN

## 2024-02-03 MED ORDER — REVEFENACIN 175 MCG/3ML IN SOLN
175.0000 ug | Freq: Every day | RESPIRATORY_TRACT | Status: DC
  Administered 2024-02-03 – 2024-02-04 (×2): 175 ug via RESPIRATORY_TRACT
  Filled 2024-02-03 (×2): qty 3

## 2024-02-03 NOTE — Consult Note (Signed)
 NAME:  Courtney Mayer, MRN:  [id number], DOB:  October 03, 1970, LOS: 1 ADMISSION DATE:  02/02/2024, CONSULTATION DATE: 02/03/2024 REFERRING MD: Drue Dorinda COME, CHIEF COMPLAINT: Recurrent pneumonia, acute on chronic respiratory failure  History of Present Illness:  Patient is a very complex 53 year old current smoker with a history as noted below who presented to [facility] on 02 February 2024 with worsening shortness of breath, hypoxemia on room air, worsening nonproductive cough, hoarseness and subjective fevers at home.  Patient had been treated with Levaquin and prednisone  and nebulizers at home prior to admission.  She saw her primary pulmonologist Dr. Belva November on 27 January 2024 who initiated that therapy.  She did not endorse any chest pain, tachypalpitations or lower extremity edema.  No hemoptysis.  Patient does endorse issues with reflux and occasional dysphagia.  Patient had bronchoscopy on 05 January 2024 by Dr. November which showed Pseudomonas both which appeared to be pansensitive.  CT chest was obtained on admission, I have independently reviewed the films.  There are severe emphysematous changes and fibrotic changes in the lungs as well as multifocal patchy airspace disease which may represent aspiration.  There is bronchial wall thickening and lower lobe mucous plugging.  There is significant dilation of the esophagus with air-fluid level present.  No mediastinal adenopathy.  There is cardiac enlargement and dilated central pulmonary arteries.  This may represent pulmonary hypertension.  PCCM has been requested to follow and assist with management.  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  Objective    Blood pressure (!) 131/91, pulse 95, temperature 98.8 F (37.1 C), temperature source Oral, resp. rate 16, height 5' 5 (1.651 m), weight 54.4 kg, SpO2 92%.    SpO2: 92 % O2 Flow Rate (L/min): 3 L/min     Intake/Output Summary (Last 24 hours) at 02/03/2024 1011 Last data filed at 02/02/2024 1858 Gross per 24 hour  Intake 250 ml  Output --  Net 250 ml   Filed Weights   02/02/24 1237  Weight: 54.4 kg    Examination: GENERAL: Thin, well-developed woman, no acute distress.  Comfortable on nasal cannula O2. HEAD: Normocephalic, atraumatic.  EYES: Pupils equal, round, reactive to light.  No scleral icterus.  MOUTH: Dentition intact, dry mucous membranes.  No thrush. NECK: Supple. No thyromegaly. Trachea midline. No JVD.  No adenopathy. PULMONARY: Good air entry bilaterally.  Diffuse rhonchi and wheezing throughout. CARDIOVASCULAR: S1 and S2. Regular rate and rhythm.  Grade 2/6 to 3/6 systolic ejection murmur left sternal border. ABDOMEN: Benign. MUSCULOSKELETAL: No joint deformity, no clubbing, no edema.  NEUROLOGIC: No focal deficit evident. SKIN: Intact,warm,dry. PSYCH: Mood and behavior normal.  Imaging   Representative images from CT performed 02 February 2024, independently reviewed:  Extensive emphysema, with some associated fibrotic changes   Emphysema, fibrotic changes and ground glass opacities consistent with multifocal infiltrates   Dilated esophagus with  air-fluid  level   Dilated esophagus appears to be full with food material   Assessment and Plan   Acute on chronic respiratory failure with hypoxia Multifocal pneumonia query aspiration COPD exacerbation Continue antibiotics, agree with antipseudomonal coverage Pulmonary hygiene with Acapella flutter valve Optimize bronchodilators: Brovana+ Pulmicort  twice daily and Yupelri once a day Albuterol  via nebulizer as needed Oxygen supplementation to maintain saturations between 88 to 92%  COPD with emphysema and fibrosis Optimize management as above Check alpha-1 antitrypsin phenotype/level Smoking cessation  Esophageal dysmotility Suspect silent aspiration due to above Query CREST syndrome Screen for connective tissue disease May benefit from evaluation by GI  Possible pulmonary hypertension Status post AVR Obtain echocardiogram  Tobacco dependence due to cigarettes Patient counseled regards to smoking cessation On nicotine  replacement therapy  Labs   CBC: Recent Labs  Lab 02/02/24 1240 02/03/24 0220  WBC 22.4* 23.8*  HGB 12.0 12.0  HCT 37.1 37.9  MCV 98.9 100.8*  PLT 456* 426*    Basic Metabolic Panel: Recent Labs  Lab 02/02/24 1240 02/03/24 0220  NA 137 138  K 4.4 4.2  CL 99 100  CO2 27 27  GLUCOSE 112* 114*  BUN 13 11  CREATININE 0.98 0.76  CALCIUM  9.0 8.9   GFR: Estimated Creatinine Clearance: 69 mL/min (by C-G formula based on SCr of 0.76 mg/dL). Recent Labs  Lab 02/02/24 1240 02/02/24 1433 02/03/24 0220  PROCALCITON <0.10  --   --   WBC 22.4*  --  23.8*  LATICACIDVEN  --  0.7  --     Liver Function Tests: Recent Labs  Lab 02/03/24 0220  AST 32  ALT 32  ALKPHOS 74  BILITOT 0.5  PROT 7.7  ALBUMIN 3.2*   No results for input(s): LIPASE, AMYLASE in the last 168 hours. No results for input(s): AMMONIA in the last 168 hours.  ABG    Component Value Date/Time   HCO3 30.2 (H) 12/17/2023 1700   TCO2 24 01/15/2021 2110   O2SAT 78.4  12/17/2023 1700     Coagulation Profile: Recent Labs  Lab 02/02/24 1433  INR 1.0    Cardiac Enzymes: No results for input(s): CKTOTAL, CKMB, CKMBINDEX, TROPONINI in the last 168 hours.  HbA1C: Hgb A1c MFr Bld  Date/Time Value Ref Range Status  05/16/2021 06:12 AM 6.0 (H) 4.8 - 5.6 % Final    Comment:    (NOTE) Pre diabetes:          5.7%-6.4%  Diabetes:              >6.4%  Glycemic control for   <7.0% adults with diabetes     CBG: No results for input(s): GLUCAP in the last 168 hours.  Review of Systems:   A 10 point review of systems was performed and it is as noted above otherwise negative.  Past Medical History:  She,  has a past medical history of Acute respiratory failure with hypoxia (HCC) (05/15/2021), Back pain, Brain aneurysm (05/26/2023), Cancer (HCC), Cerebral thrombosis with cerebral infarction [facility]) (05/17/2021), Cerebrovascular accident (CVA) due to occlusion of left middle cerebral artery (HCC), COPD (chronic obstructive pulmonary disease) (HCC), and Pneumonia of right middle lobe due to infectious organism (12/17/2023).   Surgical History:   Past Surgical History:  Procedure Laterality Date   atrial valve replacement     BREAST SURGERY Right    cyst removal   BUBBLE STUDY  06/19/2021   Procedure: BUBBLE STUDY;  Surgeon: Alveta Aleene PARAS, MD;  Location: [facility] ENDOSCOPY;  Service: Cardiovascular;;   CESAREAN SECTION     FLEXIBLE BRONCHOSCOPY Bilateral 01/05/2024   Procedure: BRONCHOSCOPY, FLEXIBLE;  Surgeon: Isadora Hose, MD;  Location: [facility] ORS;  Service: Pulmonary;  Laterality: Bilateral;   LOBECTOMY     LUNG SURGERY Left    TEE WITHOUT CARDIOVERSION N/A 06/19/2021   Procedure: TRANSESOPHAGEAL ECHOCARDIOGRAM (TEE);  Surgeon: Alveta Aleene PARAS, MD;  Location[facility] ENDOSCOPY;  Service: Cardiovascular;  Laterality: N/A;   WRIST FRACTURE SURGERY       Social History:   reports that she has been smoking cigarettes. She started smoking about 39  years ago. She has a 79.5 pack-year smoking history. She has never used smokeless tobacco. She reports that she does not currently use alcohol. She reports current drug use. Drug: Marijuana.   Family History:  Her family history includes Healthy in her father and mother.   Allergies Allergies  Allergen Reactions   Tramadol Palpitations    Can't take Ultram ER - palpitations  Regular Tramadol is fine to take Can't take Ultram ER - palpitations  Regular Tramadol is fine to take    Amoxicillin  Diarrhea   Compazine [Prochlorperazine] Other (See Comments)    Excitability   Compazine [Prochlorperazine] Anxiety     Home Medications  Prior to Admission medications   Medication Sig Start Date End Date Taking? Authorizing Provider  acyclovir (ZOVIRAX) 400 MG tablet Take 400 mg by mouth 2 (two) times daily as needed (Flair up).   Yes [provider]  albuterol  (VENTOLIN  HFA) 108 (90 Base) MCG/ACT inhaler Inhale 2 puffs into the lungs every 6 (six) hours as needed for wheezing or shortness of breath.   Yes [provider]  ALPRAZolam  (XANAX ) 0.5 MG tablet Take 0.5 mg by mouth 3 (three) times daily as needed for anxiety.   Yes [provider]  amitriptyline  (ELAVIL ) 150 MG tablet Take 300 mg by mouth at bedtime. 12/31/20  Yes [provider]  atorvastatin  (LIPITOR) 20 MG tablet Take 1 tablet (20 mg total) by mouth daily. 05/26/23  Yes Onita Duos, MD  budesonide -formoterol  (SYMBICORT ) 160-4.5 MCG/ACT inhaler Inhale 2 puffs into the lungs in the morning and at bedtime. 12/29/23  Yes Dgayli, Hose, MD  clopidogrel  (PLAVIX ) 75 MG tablet Take 1 tablet (75 mg total) by mouth daily. 06/26/23  Yes Onita Duos, MD  docusate sodium (COLACE) 100 MG capsule Take 100 mg by mouth daily.   Yes [provider]  gabapentin  (NEURONTIN ) 600 MG tablet Take 900 mg by mouth QID. 01/12/21  Yes [provider]  ipratropium-albuterol  (DUONEB) 0.5-2.5 (3) MG/3ML SOLN Take 3  mLs by nebulization every 6 (six) hours as needed. 01/27/24  Yes Dgayli, Hose, MD  LATUDA  60 MG TABS Take 60 mg by mouth at bedtime. 01/03/21  Yes [provider]  levofloxacin (LEVAQUIN) 750 MG tablet Take 1 tablet (750 mg total) by mouth daily for 7 days. 01/27/24 02/03/24 Yes Dgayli, Hose, MD  montelukast  (SINGULAIR ) 10 MG tablet Take 1 tablet (10 mg total) by mouth at bedtime. 12/29/23  Yes Dgayli, Hose, MD  nicotine  (NICODERM CQ  - DOSED IN MG/24 HOURS) 21 mg/24hr patch Place 1 patch (21 mg total) onto the skin daily. 12/29/23 02/09/24 Yes Dgayli, Hose, MD  nicotine  polacrilex (NICOTINE  MINI) 2 MG lozenge Take 1 lozenge (2 mg total) by mouth every 2 (two) hours as needed for smoking cessation. 12/29/23 03/28/24 Yes Dgayli, Hose, MD  oxyCODONE -acetaminophen  (PERCOCET) 10-325 MG tablet Take 1 tablet by mouth every 6 (six) hours as  needed. 08/06/15  Yes [provider]  oxymorphone (OPANA) 10 MG tablet Take 10 mg by mouth in the morning and at bedtime.   Yes [provider]  pantoprazole  (PROTONIX ) 40 MG tablet Take 40 mg by mouth daily. 01/14/21  Yes [provider]  QUEtiapine  (SEROQUEL ) 100 MG tablet Take 100 mg by mouth at bedtime. 01/19/24  Yes [provider]  umeclidinium bromide  (INCRUSE ELLIPTA ) 62.5 MCG/ACT AEPB Inhale 1 puff into the lungs daily. 01/11/24  Yes Dgayli, Belva, MD  zolpidem (AMBIEN) 10 MG tablet Take 10 mg by mouth daily.   Yes [provider]   Scheduled Meds:  amitriptyline   300 mg Oral QHS   atorvastatin   20 mg Oral Daily   clopidogrel   75 mg Oral Daily   docusate sodium  100 mg Oral Daily   enoxaparin (LOVENOX) injection  40 mg Subcutaneous Q24H   fluticasone  furoate-vilanterol  1 puff Inhalation Daily   gabapentin   900 mg Oral QID   lurasidone   60 mg Oral QHS   montelukast   10 mg Oral QHS   morphine  30 mg Oral Q12H   nicotine   21 mg Transdermal Q24H   pantoprazole   40 mg Oral Daily   predniSONE   40 mg Oral Q  breakfast   umeclidinium bromide   1 puff Inhalation Daily   zolpidem  5 mg Oral QHS   Continuous Infusions:  azithromycin      ceFEPime  (MAXIPIME ) IV Stopped (02/03/24 0312)   PRN Meds:.ALPRAZolam , ipratropium-albuterol , nicotine  polacrilex, oxyCODONE -acetaminophen     Level 4 consult    Discussed via secure chat with Dr. Dorinda.  I spent 70 minutes of dedicated to the care of this patient on the date of this encounter to include pre-visit review of records, face-to-face time with the patient discussing conditions above, post visit ordering of testing, clinical documentation with the electronic health record, making appropriate referrals as documented, and communicating necessary findings to members of the patients care team.   C. Leita Sanders, MD Advanced Bronchoscopy PCCM [facility]    *This note was generated using voice recognition software/Dragon and/or AI transcription program.  Despite best efforts to proofread, errors can occur which can change the meaning. Any transcriptional errors that result from this process are unintentional and may not be fully corrected at the time of dictation.

## 2024-02-03 NOTE — ED Notes (Signed)
 RT called for Flutter valve.

## 2024-02-03 NOTE — Progress Notes (Signed)
 Progress Note   Patient: Courtney Mayer FMW:[id number] DOB: 10/19/1970 DOA: 02/02/2024     1 DOS: the patient was seen and examined on 02/03/2024   Brief hospital course: From HPI Analisia Kingsford is a 53 y.o. 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  Denies chest pain, palpitations, abdominal pain. patient has been recently treated for pneumonia, was seen by pulmonary Dr.Dgayli on 01/27/24, was being treated with Levaquin, prednisone , nebulizers for recent right middle lobe pneumonia   Presentation to ED found to be afebrile, RR 22, HR 98, blood pressure borderline low 90/73, saturating 93% on 4 L  Workup revealed BMP unremarkable except mild hyperglycemia, CBC with wbc 22.4, Hb 12.0, platelet count 456. LA not elevated D-dimer elevated Resp panel pending, Blood cultures sent in ER CXR shows improving bibasilar opacities with decreased atelectasis at left lung base, chronic bronchitic type bronchial wall thickening persists.  Was given dose of vancomycin , cefepime  in ER.  Discussed with pulmonary, recommend broad-spectrum antibiotics covering MRSA, Pseudomonas, atypicals     Assessment and Plan:    Acute on chronic respiratory failure with hypoxia Likely pneumonia, r/o PE Hx malignant neoplasm of LUL lung s/p left upper lobectomy 2017 Continue current antibiotics Continue current oxygen requirement - CXR shows improving bibasilar opacities with decreased atelectasis at left lung base, chronic bronchitic type bronchial wall thickening persists - d-dimer elevated, leukocytosis Vancomycin  discontinued Continue cefepime  and azithromycin  -Follow-up on urine Legionella, Streptococcus  pneumonia antigen, UA - Pulmonary consult, notified by ER - CT PE did not show acute PE - Send MRSA nares, RVP, procalcitonin - Follow respiratory culture, blood culture Plan of care discussed with pulmonologist   COPD exacerbation - On prednisone  40mg  daily - Home inhalers, Duonebs prn   Leukocytosis  - Multifactoral likely 2/2 pneumonia, also on steroid therapy - Monitor WBC   Tobacco use - Nicotine  patches and gums   Generalized anxiety and major depression - Home Xanax , Amitriptyline , Zolpidem   Chronic back pain - Resume home Gabapentin , Percocet, Oxymorphone   Advance Care Planning:   Code Status: Full Code discussed with patient at bedside   Consults: Pulmonary   Family Communication: Mother - at the bedside   Subjective:  Denies nausea vomiting abdominal Has been seen by pulmonologist and likely having esophageal dysmotility to lead III leading to aspiration  Physical Exam: Vitals:   02/03/24 1400 02/03/24 1545 02/03/24 1600 02/03/24 1826  BP: (!) 124/90  109/86   Pulse: 86 85 93   Resp: 16 11 14    Temp:    (!) 97.5 F (36.4 C)  TempSrc:    Oral  SpO2: 97% 100% 99%   Weight:      Height:        Data Reviewed:    Latest Ref Rng & Units 02/03/2024    2:20 AM 02/02/2024   12:40 PM 12/17/2023    4:43 PM  BMP  Glucose 70 - 99 mg/dL 885  887  86   BUN 6 - 20 mg/dL 11  13  13    Creatinine 0.44 - 1.00 mg/dL 9.23  9.01  8.91   Sodium 135 - 145 mmol/L 138  137  138   Potassium 3.5 - 5.1 mmol/L 4.2  4.4  4.3   Chloride 98 - 111 mmol/L 100  99  99   CO2 22 - 32 mmol/L 27  27  26    Calcium  8.9 - 10.3 mg/dL 8.9  9.0  9.8        Latest Ref Rng & Units 02/03/2024    2:20 AM 02/02/2024   12:40 PM 12/17/2023    3:00 PM  CBC  WBC 4.0 - 10.5 K/uL 23.8  22.4  11.2   Hemoglobin 12.0 - 15.0 g/dL 87.9  87.9  86.1   Hematocrit 36.0 - 46.0 % 37.9  37.1  42.1   Platelets 150 - 400 K/uL 426  456  462      Family Communication: None at bedside   Time spent: 51  minutes  Author: Drue ONEIDA Potter, MD 02/03/2024 6:33 PM  For on call review [email or web address].

## 2024-02-03 NOTE — ED Notes (Signed)
 Lab called for lab draw.

## 2024-02-03 NOTE — Progress Notes (Signed)
 Pharmacy Antibiotic Note  Courtney Mayer is a 53 y.o. female admitted on 02/02/2024 with pneumonia.  Pharmacy has been consulted for vancomycin  dosing.  Plan: Vancomycin  1250 mg IV Q24H. Goal AUC 400-550. Expected AUC: 517.2 Expected Css min: 10.3 SCr used: 0.8(actual 0.76) Weight used: TBW<IBW, Vd used: 0.72 (BMI 19.97) Patient is also on cefepime  2 g IV Q8H Patient is also on azithromycin  500 mg IV Q24H Continue to monitor renal function and follow culture results Follow up MRSA PCR for possible de-escalation   Height: 5' 5 (165.1 cm) Weight: 54.4 kg (120 lb) IBW/kg (Calculated) : 57  Temp (24hrs), Avg:98 F (36.7 C), Min:97.6 F (36.4 C), Max:98.6 F (37 C)  Recent Labs  Lab 02/02/24 1240 02/02/24 1433 02/03/24 0220  WBC 22.4*  --  23.8*  CREATININE 0.98  --  0.76  LATICACIDVEN  --  0.7  --     Estimated Creatinine Clearance: 69 mL/min (by C-G formula based on SCr of 0.76 mg/dL).    Allergies  Allergen Reactions   Tramadol Palpitations    Can't take Ultram ER - palpitations  Regular Tramadol is fine to take Can't take Ultram ER - palpitations  Regular Tramadol is fine to take    Amoxicillin  Diarrhea   Compazine [Prochlorperazine] Other (See Comments)    Excitability   Compazine [Prochlorperazine] Anxiety    Antimicrobials this admission: 10/7 Vanc>> 10/7 cefepime >> 10/7 azithromycin >>   Microbiology results: 10/7 BCx: NGTD 10/7 MRSA PCR: negative  Thank you for allowing pharmacy to be a part of this patient's care.  Jamae Tison A Ulyssa Walthour, PharmD Clinical Pharmacist 02/03/2024 8:56 AM

## 2024-02-03 NOTE — ED Notes (Signed)
Lab called again for lab draw

## 2024-02-04 DIAGNOSIS — J189 Pneumonia, unspecified organism: Secondary | ICD-10-CM | POA: Diagnosis not present

## 2024-02-04 DIAGNOSIS — I340 Nonrheumatic mitral (valve) insufficiency: Secondary | ICD-10-CM

## 2024-02-04 LAB — CBC WITH DIFFERENTIAL/PLATELET
Abs Immature Granulocytes: 0.26 K/uL — ABNORMAL HIGH (ref 0.00–0.07)
Basophils Absolute: 0 K/uL (ref 0.0–0.1)
Basophils Relative: 0 %
Eosinophils Absolute: 0.1 K/uL (ref 0.0–0.5)
Eosinophils Relative: 0 %
HCT: 33.7 % — ABNORMAL LOW (ref 36.0–46.0)
Hemoglobin: 10.8 g/dL — ABNORMAL LOW (ref 12.0–15.0)
Immature Granulocytes: 1 %
Lymphocytes Relative: 16 %
Lymphs Abs: 3.8 K/uL (ref 0.7–4.0)
MCH: 31.9 pg (ref 26.0–34.0)
MCHC: 32 g/dL (ref 30.0–36.0)
MCV: 99.4 fL (ref 80.0–100.0)
Monocytes Absolute: 1.4 K/uL — ABNORMAL HIGH (ref 0.1–1.0)
Monocytes Relative: 6 %
Neutro Abs: 18.3 K/uL — ABNORMAL HIGH (ref 1.7–7.7)
Neutrophils Relative %: 77 %
Platelets: 364 K/uL (ref 150–400)
RBC: 3.39 MIL/uL — ABNORMAL LOW (ref 3.87–5.11)
RDW: 14.8 % (ref 11.5–15.5)
WBC: 23.9 K/uL — ABNORMAL HIGH (ref 4.0–10.5)
nRBC: 0 % (ref 0.0–0.2)

## 2024-02-04 LAB — LEGIONELLA PNEUMOPHILA SEROGP 1 UR AG: L. pneumophila Serogp 1 Ur Ag: NEGATIVE

## 2024-02-04 LAB — BASIC METABOLIC PANEL WITH GFR
Anion gap: 7 (ref 5–15)
BUN: 16 mg/dL (ref 6–20)
CO2: 31 mmol/L (ref 22–32)
Calcium: 8.7 mg/dL — ABNORMAL LOW (ref 8.9–10.3)
Chloride: 101 mmol/L (ref 98–111)
Creatinine, Ser: 0.9 mg/dL (ref 0.44–1.00)
GFR, Estimated: >60 mL/min (ref >60)
Glucose, Bld: 91 mg/dL (ref 70–99)
Potassium: 4 mmol/L (ref 3.5–5.1)
Sodium: 139 mmol/L (ref 135–145)

## 2024-02-04 LAB — MAGNESIUM: Magnesium: 2 mg/dL (ref 1.7–2.4)

## 2024-02-04 LAB — C-REACTIVE PROTEIN: CRP: 9.5 mg/dL — ABNORMAL HIGH (ref <1.0)

## 2024-02-04 MED ORDER — AEROCHAMBER PLUS FLO-VU MEDIUM MISC
1.0000 | Freq: Once | Status: AC
  Administered 2024-02-04: 1
  Filled 2024-02-04: qty 1

## 2024-02-04 MED ORDER — BUDESON-GLYCOPYRROL-FORMOTEROL 160-9-4.8 MCG/ACT IN AERO
2.0000 | INHALATION_SPRAY | Freq: Two times a day (BID) | RESPIRATORY_TRACT | Status: DC
  Administered 2024-02-05: 2 via RESPIRATORY_TRACT
  Filled 2024-02-04: qty 5.9

## 2024-02-04 NOTE — Progress Notes (Signed)
 NAME:  Courtney Mayer, MRN:  [id number], DOB:  08-12-1970, LOS: 2 ADMISSION DATE:  02/02/2024, CONSULTATION DATE: 02/03/2024 REFERRING MD: Drue Dorinda COME, CHIEF COMPLAINT: Recurrent pneumonia, acute on chronic respiratory failure  History of Present Illness:  Patient is a very complex 53 year old current smoker with a history as noted below who presented to [facility] on 02 February 2024 with worsening shortness of breath, hypoxemia on room air, worsening nonproductive cough, hoarseness and subjective fevers at home.  Patient had been treated with Levaquin and prednisone  and nebulizers at home prior to admission.  She saw her primary pulmonologist Dr. Belva November on 27 January 2024 who initiated that therapy.  She did not endorse any chest pain, tachypalpitations or lower extremity edema.  No hemoptysis.  Patient does endorse issues with reflux and occasional dysphagia.  Patient had bronchoscopy on 05 January 2024 by Dr. November which showed Pseudomonas both which appeared to be pansensitive.  CT chest was obtained on admission, I have independently reviewed the films.  There are severe emphysematous changes and fibrotic changes in the lungs as well as multifocal patchy airspace disease which may represent aspiration.  There is bronchial wall thickening and lower lobe mucous plugging.  There is significant dilation of the esophagus with air-fluid level present.  No mediastinal adenopathy.  There is cardiac enlargement and dilated central pulmonary arteries.  This may represent pulmonary hypertension.  PCCM has been requested to follow and assist with management.  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  Lost flutter valve in transfer from ED to ward.  No chest pain.  Cough and shortness of breath improved.  Patient had multiple patients, these were answered to her satisfaction.  Objective    Blood pressure 118/80, pulse 94, temperature 98.4 F (36.9 C), temperature source Oral, resp. rate 20, height 5' 5 (1.651 m), weight 54.4 kg, SpO2 94%.    SpO2: 94 % O2 Flow Rate (L/min): 3 L/min     Intake/Output Summary (Last 24 hours) at 02/04/2024 1346 Last data filed at 02/04/2024 0304 Gross per 24 hour  Intake 441.85 ml  Output --  Net 441.85 ml   Filed Weights   02/02/24 1237  Weight: 54.4 kg    Examination: GENERAL: Thin, well-developed woman, no acute distress.  Comfortable on nasal cannula O2. HEAD: Normocephalic, atraumatic.  EYES: Pupils equal, round, reactive to light.  No scleral icterus.  MOUTH: Poor dentition, dry mucous membranes.  No thrush. NECK: Supple. No thyromegaly. Trachea midline. No JVD.  No adenopathy. PULMONARY: Good air entry bilaterally.  No adventitious sounds. CARDIOVASCULAR: S1 and S2. Regular rate and rhythm.  Grade 2/6 to 3/6 systolic ejection murmur left sternal border. ABDOMEN: Benign. MUSCULOSKELETAL: No joint deformity, no clubbing, no edema.  NEUROLOGIC: No focal deficit evident. SKIN: Intact,warm,dry. PSYCH: Mood and behavior normal.  Imaging   Representative  images from CT performed 02 February 2024, independently reviewed:  Extensive emphysema, with some associated fibrotic changes   Emphysema, fibrotic changes and ground glass opacities consistent with multifocal infiltrates   Dilated esophagus with air-fluid level   Dilated esophagus appears to be full with food material   Assessment and Plan   Acute on chronic respiratory failure with hypoxia Multifocal pneumonia query aspiration Stage 3 COPD with acute exacerbation Continue antibiotics, agree with antipseudomonal coverage Pulmonary hygiene with Acapella flutter valve Continue bronchodilators: Brovana+ Pulmicort  twice daily and Yupelri once a day through today Transition to Breztri 2 puffs twice a day in the morning Albuterol  via nebulizer as needed Oxygen supplementation to maintain saturations between 88 to 92% CXR 2 view in AM  COPD with emphysema and fibrosis Optimize management as above Check alpha-1 antitrypsin phenotype/level Smoking cessation  Esophageal dysmotility Suspect silent aspiration due to above Query CREST syndrome Screen for connective tissue disease -pending Inflammatory markers are elevated GI will evaluate as outpatient  Moderate mitral regurgitation Status post AVR Recommend cardiology follow-up as an outpatient  Tobacco dependence due to cigarettes Continue counseling with regards to smoking cessation On nicotine  replacement therapy  Labs   CBC: Recent Labs  Lab 02/02/24 1240 02/03/24 0220 02/04/24 0256  WBC 22.4* 23.8* 23.9*  NEUTROABS  --   --  18.3*  HGB 12.0 12.0 10.8*  HCT 37.1 37.9 33.7*  MCV 98.9 100.8* 99.4  PLT 456* 426* 364    Basic Metabolic Panel: Recent Labs  Lab 02/02/24 1240 02/03/24 0220 02/04/24 0256  NA 137 138 139  K 4.4 4.2 4.0  CL 99 100 101  CO2 27 27 31   GLUCOSE 112* 114* 91  BUN 13 11 16   CREATININE 0.98 0.76 0.90  CALCIUM  9.0 8.9 8.7*  MG  --   --  2.0   GFR: Estimated Creatinine Clearance:  61.4 mL/min (by C-G formula based on SCr of 0.9 mg/dL). Recent Labs  Lab 02/02/24 1240 02/02/24 1433 02/03/24 0220 02/04/24 0256  PROCALCITON <0.10  --   --   --   WBC 22.4*  --  23.8* 23.9*  LATICACIDVEN  --  0.7  --   --     Liver Function Tests: Recent Labs  Lab 02/03/24 0220  AST 32  ALT 32  ALKPHOS 74  BILITOT 0.5  PROT 7.7  ALBUMIN 3.2*   No results for input(s): LIPASE, AMYLASE in the last 168 hours. No results for input(s): AMMONIA in the last 168 hours.  ABG    Component Value Date/Time   HCO3 30.2 (H) 12/17/2023 1700   TCO2 24 01/15/2021 2110   O2SAT 78.4 12/17/2023 1700     Coagulation Profile: Recent Labs  Lab 02/02/24 1433  INR 1.0    Cardiac Enzymes: No results for input(s): CKTOTAL, CKMB, CKMBINDEX, TROPONINI in the last 168 hours.  HbA1C: Hgb A1c MFr Bld  Date/Time Value Ref Range Status  05/16/2021 06:12 AM 6.0 (H) 4.8 - 5.6 % Final    Comment:    (NOTE) Pre diabetes:          5.7%-6.4%  Diabetes:              >6.4%  Glycemic control for   <7.0% adults with diabetes     CBG: No results for input(s): GLUCAP in the last 168 hours.  Review of Systems:   A 10 point review of systems was performed and it is as noted above otherwise negative.  Allergies Allergies  Allergen Reactions   Tramadol Palpitations    Can't take Ultram ER - palpitations  Regular Tramadol is fine to take Can't take Ultram ER - palpitations  Regular Tramadol is fine to take    Amoxicillin  Diarrhea   Compazine [Prochlorperazine] Other (See Comments)    Excitability   Compazine [Prochlorperazine] Anxiety     Home Medications  Prior to Admission medications   Medication Sig Start Date End Date Taking? Authorizing Provider  acyclovir (ZOVIRAX) 400 MG tablet Take 400 mg by mouth 2 (two) times daily as needed (Flair up).   Yes [provider]  albuterol  (VENTOLIN  HFA) 108 (90 Base) MCG/ACT inhaler Inhale 2 puffs into the lungs  every 6 (six) hours as needed for wheezing or shortness of breath.   Yes [provider]  ALPRAZolam  (XANAX ) 0.5 MG tablet Take 0.5 mg by mouth 3 (three) times daily as needed for anxiety.   Yes [provider]  amitriptyline  (ELAVIL ) 150 MG tablet Take 300 mg by mouth at bedtime. 12/31/20  Yes [provider]  atorvastatin  (LIPITOR) 20 MG tablet Take 1 tablet (20 mg total) by mouth daily. 05/26/23  Yes Onita Duos, MD  budesonide -formoterol  (SYMBICORT ) 160-4.5 MCG/ACT inhaler Inhale 2 puffs into the lungs in the morning and at bedtime. 12/29/23  Yes Dgayli, Belva, MD  clopidogrel  (PLAVIX ) 75 MG tablet Take 1 tablet (75 mg total) by mouth daily. 06/26/23  Yes Onita Duos, MD  docusate sodium (COLACE) 100 MG capsule Take 100 mg by mouth daily.   Yes [provider]  gabapentin  (NEURONTIN ) 600 MG tablet Take 900 mg by mouth QID. 01/12/21  Yes [provider]  ipratropium-albuterol  (DUONEB) 0.5-2.5 (3) MG/3ML SOLN Take 3 mLs by nebulization every 6 (six) hours as needed. 01/27/24  Yes Dgayli, Belva, MD  LATUDA  60 MG TABS Take 60 mg by mouth at bedtime. 01/03/21  Yes [provider]  levofloxacin (LEVAQUIN) 750 MG tablet Take 1 tablet (750 mg total) by mouth daily for 7 days. 01/27/24 02/03/24 Yes Dgayli, Belva, MD  montelukast  (SINGULAIR ) 10 MG tablet Take 1 tablet (10 mg total) by mouth at bedtime. 12/29/23  Yes Dgayli, Belva, MD  nicotine  (NICODERM CQ  - DOSED IN MG/24 HOURS) 21 mg/24hr patch Place 1 patch (21 mg total) onto the skin daily. 12/29/23 02/09/24 Yes Dgayli, Belva, MD  nicotine  polacrilex (NICOTINE  MINI) 2 MG lozenge Take 1 lozenge (2 mg total) by mouth every 2 (two) hours as needed for smoking cessation. 12/29/23 03/28/24 Yes Dgayli, Belva, MD  oxyCODONE -acetaminophen  (PERCOCET) 10-325 MG tablet Take 1 tablet by mouth every 6 (six) hours as needed. 08/06/15  Yes [provider]  oxymorphone (OPANA) 10 MG tablet Take 10 mg by mouth in the  morning and at bedtime.   Yes [provider]  pantoprazole  (PROTONIX ) 40 MG tablet Take 40 mg by mouth daily. 01/14/21  Yes [provider]  QUEtiapine  (SEROQUEL ) 100 MG tablet Take 100 mg by mouth at bedtime. 01/19/24  Yes [provider]  umeclidinium bromide  (INCRUSE ELLIPTA ) 62.5 MCG/ACT AEPB Inhale 1 puff into the lungs daily. 01/11/24  Yes Dgayli, Belva, MD  zolpidem (AMBIEN) 10 MG tablet Take 10 mg by mouth daily.   Yes [provider]   Scheduled Meds:  amitriptyline   300 mg Oral QHS   arformoterol  15 mcg Nebulization BID   atorvastatin   20 mg Oral Daily   budesonide  (PULMICORT ) nebulizer solution  0.25 mg Nebulization BID   clopidogrel   75  mg Oral Daily   docusate sodium  100 mg Oral Daily   enoxaparin (LOVENOX) injection  40 mg Subcutaneous Q24H   gabapentin   900 mg Oral QID   influenza vac split trivalent PF  0.5 mL Intramuscular Tomorrow-1000   lurasidone   60 mg Oral QHS   morphine  30 mg Oral Q12H   nicotine   21 mg Transdermal Q24H   pantoprazole   40 mg Oral Daily   predniSONE   40 mg Oral Q breakfast   revefenacin  175 mcg Nebulization Daily   zolpidem  5 mg Oral QHS   Continuous Infusions:  azithromycin  Stopped (02/03/24 1615)   ceFEPime  (MAXIPIME ) IV 2 g (02/04/24 1148)   PRN Meds:.albuterol , ALPRAZolam , nicotine  polacrilex, oxyCODONE -acetaminophen , senna    Level 3 F/U    Discussed with Dr. Dorinda in person.  I spent 35 minutes of dedicated to the care of this patient on the date of this encounter to include pre-visit review of records, face-to-face time with the patient discussing conditions above, post visit ordering of testing, clinical documentation with the electronic health record, making appropriate referrals as documented, and communicating necessary findings to members of the patients care team.   C. Leita Sanders, MD Advanced Bronchoscopy PCCM [facility]    *This note was generated using voice  recognition software/Dragon and/or AI transcription program.  Despite best efforts to proofread, errors can occur which can change the meaning. Any transcriptional errors that result from this process are unintentional and may not be fully corrected at the time of dictation.

## 2024-02-04 NOTE — TOC CM/SW Note (Signed)
 Transition of Care [facility]) CM/SW Note    Transition of Care [facility]) - Inpatient Brief Assessment   Patient Details  Name: Courtney Mayer MRN: [id number] Date of Birth: [birth year]  Transition of Care [facility]) CM/SW Contact:    Alfonso Rummer, LCSW Phone Number: 02/04/2024, 9:14 AM   Clinical Narrative: KEN DELENA Rummer completed TOC chart review No TOC needs please contact should needs arise   Transition of Care Asessment: Insurance and Status: Insurance coverage has been reviewed Patient has primary care physician: Yes ([facility], STEPHEN) Home environment has been reviewed: single family home   Prior/Current Home Services: No current home services Social Drivers of Health Review: SDOH reviewed no interventions necessary Readmission risk has been reviewed: No Transition of care needs: no transition of care needs at this time

## 2024-02-04 NOTE — Plan of Care (Signed)

## 2024-02-04 NOTE — Progress Notes (Signed)
 Progress Note   Patient: Courtney Mayer FMW:[id number] DOB: [birth year] DOA: 02/02/2024     2 DOS: the patient was seen and examined on 02/04/2024     Brief hospital course: From HPI Courtney Mayer is a 53 y.o. 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  Denies chest pain, palpitations, abdominal pain. patient has been recently treated for pneumonia, was seen by pulmonary Dr.Dgayli on 01/27/24, was being treated with Levaquin, prednisone , nebulizers for recent right middle lobe pneumonia   Presentation to ED found to be afebrile, RR 22, HR 98, blood pressure borderline low 90/73, saturating 93% on 4 L [place] Workup revealed BMP unremarkable except mild hyperglycemia, CBC with wbc 22.4, Hb 12.0, platelet count 456. LA not elevated D-dimer elevated Resp panel pending, Blood cultures sent in ER CXR shows improving bibasilar opacities with decreased atelectasis at left lung base, chronic bronchitic type bronchial wall thickening persists.  Was given dose of vancomycin , cefepime  in ER.  Discussed with pulmonary, recommend broad-spectrum antibiotics covering MRSA, Pseudomonas, atypicals      Assessment and Plan:     Acute on chronic respiratory failure with hypoxia Likely pneumonia, r/o PE Hx malignant neoplasm of LUL lung s/p left upper lobectomy 2017 Continue current antibiotics Continue current oxygen requirement - CXR shows improving bibasilar opacities with decreased atelectasis at left lung base, chronic bronchitic type bronchial wall thickening persists - d-dimer elevated, leukocytosis Vancomycin  discontinued Continue cefepime  and azithromycin  Discussed the case with pulmonologist  today - CT PE did not show acute PE - Send MRSA nares, RVP, procalcitonin - Follow respiratory culture, blood culture   COPD exacerbation - On prednisone  40mg  daily - Home inhalers, Duonebs prn   Leukocytosis  - Multifactoral likely 2/2 pneumonia, also on steroid therapy - Monitor WBC   Tobacco use - Nicotine  patches and gums   Generalized anxiety and major depression - Home Xanax , Amitriptyline , Zolpidem   Chronic back pain - Resume home Gabapentin , Percocet, Oxymorphone    Advance Care Planning:   Code Status: Full Code discussed with patient at bedside   Consults: Pulmonary   Family Communication: Mother - at the bedside     Subjective:  Denies nausea vomiting abdominal Respiratory function improving Currently down to 3 L of oxygen   Physical Exam:  General: Awake, no distress. On supplemental oxygen CV:  Tachycardic, no murmurs Resp:   Coarse breath sounds bilaterally, mild wheezing present Abd: Soft, nontender, nondistended Neuro: AO x 3, no gross focal deficit   Data Reviewed:    Latest Ref Rng & Units 02/04/2024    2:56 AM 02/03/2024    2:20 AM 02/02/2024   12:40 PM  CBC  WBC 4.0 - 10.5 K/uL 23.9  23.8  22.4   Hemoglobin 12.0 - 15.0 g/dL 89.1  87.9  87.9   Hematocrit 36.0 - 46.0 % 33.7  37.9  37.1   Platelets 150 - 400 K/uL 364  426  456        Latest Ref Rng & Units 02/04/2024    2:56 AM 02/03/2024    2:20 AM 02/02/2024   12:40 PM  BMP  Glucose 70 - 99 mg/dL 91  885  887   BUN 6 - 20 mg/dL 16  11  13   Creatinine 0.44 - 1.00 mg/dL 9.09  9.23  9.01   Sodium 135 - 145 mmol/L 139  138  137   Potassium 3.5 - 5.1 mmol/L 4.0  4.2  4.4   Chloride 98 - 111 mmol/L 101  100  99   CO2 22 - 32 mmol/L 31  27  27    Calcium  8.9 - 10.3 mg/dL 8.7  8.9  9.0     Vitals:   02/04/24 0342 02/04/24 0818 02/04/24 1120 02/04/24 1505  BP: 114/76 108/67 118/80 123/89  Pulse: 87 87 94 88  Resp: 20     Temp: (!) 97.4 F (36.3 C) 98.2 F (36.8 C) 98.4 F (36.9 C) 98  F (36.7 C)  TempSrc:  Oral Oral Oral  SpO2: 91% 96% 94% 94%  Weight:      Height:        Author: Drue ONEIDA Potter, MD 02/04/2024 3:58 PM  For on call review [email or web address].

## 2024-02-05 ENCOUNTER — Other Ambulatory Visit: Payer: Self-pay | Admitting: Pulmonary Disease

## 2024-02-05 ENCOUNTER — Inpatient Hospital Stay

## 2024-02-05 DIAGNOSIS — J189 Pneumonia, unspecified organism: Secondary | ICD-10-CM | POA: Diagnosis not present

## 2024-02-05 DIAGNOSIS — J690 Pneumonitis due to inhalation of food and vomit: Secondary | ICD-10-CM

## 2024-02-05 DIAGNOSIS — K2289 Other specified disease of esophagus: Secondary | ICD-10-CM

## 2024-02-05 DIAGNOSIS — K219 Gastro-esophageal reflux disease without esophagitis: Secondary | ICD-10-CM

## 2024-02-05 DIAGNOSIS — J441 Chronic obstructive pulmonary disease with (acute) exacerbation: Secondary | ICD-10-CM

## 2024-02-05 LAB — BASIC METABOLIC PANEL WITH GFR
Anion gap: 7 (ref 5–15)
BUN: 16 mg/dL (ref 6–20)
CO2: 33 mmol/L — ABNORMAL HIGH (ref 22–32)
Calcium: 9 mg/dL (ref 8.9–10.3)
Chloride: 102 mmol/L (ref 98–111)
Creatinine, Ser: 0.75 mg/dL (ref 0.44–1.00)
GFR, Estimated: >60 mL/min (ref >60)
Glucose, Bld: 82 mg/dL (ref 70–99)
Potassium: 4.3 mmol/L (ref 3.5–5.1)
Sodium: 142 mmol/L (ref 135–145)

## 2024-02-05 LAB — CBC WITH DIFFERENTIAL/PLATELET
Abs Immature Granulocytes: 0.18 K/uL — ABNORMAL HIGH (ref 0.00–0.07)
Basophils Absolute: 0 K/uL (ref 0.0–0.1)
Basophils Relative: 0 %
Eosinophils Absolute: 0.1 K/uL (ref 0.0–0.5)
Eosinophils Relative: 1 %
HCT: 33.6 % — ABNORMAL LOW (ref 36.0–46.0)
Hemoglobin: 10.7 g/dL — ABNORMAL LOW (ref 12.0–15.0)
Immature Granulocytes: 1 %
Lymphocytes Relative: 21 %
Lymphs Abs: 3.8 K/uL (ref 0.7–4.0)
MCH: 31.8 pg (ref 26.0–34.0)
MCHC: 31.8 g/dL (ref 30.0–36.0)
MCV: 99.7 fL (ref 80.0–100.0)
Monocytes Absolute: 1.2 K/uL — ABNORMAL HIGH (ref 0.1–1.0)
Monocytes Relative: 6 %
Neutro Abs: 13.2 K/uL — ABNORMAL HIGH (ref 1.7–7.7)
Neutrophils Relative %: 71 %
Platelets: 389 K/uL (ref 150–400)
RBC: 3.37 MIL/uL — ABNORMAL LOW (ref 3.87–5.11)
RDW: 14.8 % (ref 11.5–15.5)
WBC: 18.5 K/uL — ABNORMAL HIGH (ref 4.0–10.5)
nRBC: 0 % (ref 0.0–0.2)

## 2024-02-05 LAB — SCLERODERMA DIAGNOSTIC PROFILE
Anti Nuclear Antibody (ANA): NEGATIVE
Scleroderma (Scl-70) (ENA) Antibody, IgG: <0.2 AI (ref 0.0–0.9)

## 2024-02-05 LAB — FUNGUS CULTURE RESULT

## 2024-02-05 LAB — FUNGAL ORGANISM REFLEX

## 2024-02-05 LAB — FUNGUS CULTURE WITH STAIN

## 2024-02-05 LAB — SJOGRENS SYNDROME-B EXTRACTABLE NUCLEAR ANTIBODY: SSB (La) (ENA) Antibody, IgG: <0.2 AI (ref 0.0–0.9)

## 2024-02-05 LAB — SJOGRENS SYNDROME-A EXTRACTABLE NUCLEAR ANTIBODY: SSA (Ro) (ENA) Antibody, IgG: <0.2 AI (ref 0.0–0.9)

## 2024-02-05 MED ORDER — PREDNISONE 20 MG PO TABS: 40.0000 mg | ORAL_TABLET | Freq: Every day | ORAL | 0 refills | Status: AC

## 2024-02-05 MED ORDER — BREZTRI AEROSPHERE 160-9-4.8 MCG/ACT IN AERO: 2.0000 | INHALATION_SPRAY | Freq: Two times a day (BID) | RESPIRATORY_TRACT | 11 refills | Status: DC

## 2024-02-05 MED ORDER — AMOXICILLIN-POT CLAVULANATE 875-125 MG PO TABS: 1.0000 | ORAL_TABLET | Freq: Two times a day (BID) | ORAL | 0 refills | Status: AC

## 2024-02-05 MED ORDER — PNEUMOCOCCAL 20-VAL CONJ VACC 0.5 ML IM SUSY
0.5000 mL | PREFILLED_SYRINGE | Freq: Once | INTRAMUSCULAR | Status: AC
  Administered 2024-02-05: 0.5 mL via INTRAMUSCULAR
  Filled 2024-02-05: qty 0.5

## 2024-02-05 NOTE — Progress Notes (Signed)
 NAME:  Courtney Mayer, MRN:  [id number], DOB:  [birth year], LOS: 3 ADMISSION DATE:  02/02/2024, CONSULTATION DATE: 02/03/2024 REFERRING MD: Drue Dorinda COME, CHIEF COMPLAINT: Recurrent pneumonia, acute on chronic respiratory failure  History of Present Illness:  Patient is a very complex 53 year old current smoker with a history as noted below who presented to [facility] on 02 February 2024 with worsening shortness of breath, hypoxemia on room air, worsening nonproductive cough, hoarseness and subjective fevers at home.  Patient had been treated with Levaquin and prednisone  and nebulizers at home prior to admission.  She saw her primary pulmonologist Dr. Belva November on 27 January 2024 who initiated that therapy.  She did not endorse any chest pain, tachypalpitations or lower extremity edema.  No hemoptysis.  Patient does endorse issues with reflux and occasional dysphagia.  Patient had bronchoscopy on 05 January 2024 by Dr. November which showed Pseudomonas which appeared to be pansensitive.  CT chest was obtained on admission, I have independently reviewed the films.  There are severe emphysematous changes and fibrotic changes in the lungs as well as multifocal patchy airspace disease which may represent aspiration.  There is bronchial wall thickening and lower lobe mucous plugging.  There is significant dilation of the esophagus with air-fluid level present.  No mediastinal adenopathy.  There is cardiac enlargement and dilated central pulmonary arteries.  This may represent pulmonary hypertension.  PCCM has been requested to follow and assist with management.  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  To be discharged today.  Tolerated Breztri.  Lorraine also covered by her insurance.  Objective    Blood pressure 123/81, pulse 85, temperature 98.3 F (36.8 C), temperature source Oral, resp. rate 20, height 5' 5 (1.651 m), weight 54.4 kg, SpO2 97%.    SpO2: 97 % O2 Flow Rate (L/min): 3 L/min     Intake/Output Summary (Last 24 hours) at 02/05/2024 1324 Last data filed at 02/05/2024 1038 Gross per 24 hour  Intake 600 ml  Output --  Net 600 ml   Filed Weights   02/02/24 1237  Weight: 54.4 kg    Examination: GENERAL: Thin, well-developed woman, no acute distress.  Comfortable on nasal cannula O2. HEAD: Normocephalic, atraumatic.  EYES: Pupils equal, round, reactive to light.  No scleral icterus.  MOUTH: Poor dentition, dry mucous membranes.  No thrush. NECK: Supple. No thyromegaly. Trachea midline. No JVD.  No adenopathy. PULMONARY: Good air entry bilaterally.  Rare scattered rhonchi, no wheezes. CARDIOVASCULAR: S1 and S2. Regular rate and rhythm.  Grade 2/6 to 3/6 systolic ejection murmur left sternal border. ABDOMEN: Benign. MUSCULOSKELETAL: No joint deformity, no clubbing, no edema.  NEUROLOGIC: No focal deficit evident. SKIN: Intact,warm,dry. PSYCH: Mood and behavior normal.  Imaging   Chest x-ray performed today showing persistent left  lower lobe  infiltrate.    Assessment and Plan   Acute on chronic respiratory failure with hypoxia Multifocal pneumonia likely due to aspiration Stage 3 COPD with acute exacerbation Continue antibiotics, may switch to p.o. Continue pulmonary hygiene with Acapella flutter valve Transitioned to Breztri 2 puffs twice a day with spacer Albuterol  as needed Oxygen supplementation to maintain saturations between 88 to 92% Patient has oxygen concentrator and POC at home Taper steroids over a weeks interval of time  COPD with emphysema and fibrosis Breztri 2 puffs twice a day with spacer, as needed albuterol  Alpha-1 antitrypsin phenotype/level -pending Smoking cessation, on nicotine  replacement therapy  Esophageal dysmotility Suspect silent aspiration due to above Query CREST syndrome Screen for connective tissue disease -pending Inflammatory markers are elevated Referred to [facility] in [facility] for evaluation of potential esophageal dysmotility  Moderate mitral regurgitation Status post AVR Recommend cardiology follow-up as an outpatient Patient prefers [facility] cardiology in [place] will call with cardiologist of choice  Tobacco dependence due to cigarettes Continued counseling with regards to smoking cessation On nicotine  replacement therapy  Labs   CBC: Recent Labs  Lab 02/02/24 1240 02/03/24 0220 02/04/24 0256 02/05/24 0415  WBC 22.4* 23.8* 23.9* 18.5*  NEUTROABS  --   --  18.3* 13.2*  HGB 12.0 12.0 10.8* 10.7*  HCT 37.1 37.9 33.7* 33.6*  MCV 98.9 100.8* 99.4 99.7  PLT 456* 426* 364 389    Basic Metabolic Panel: Recent Labs  Lab 02/02/24 1240 02/03/24 0220 02/04/24 0256 02/05/24 0415  NA 137 138 139 142  K 4.4 4.2 4.0 4.3  CL 99 100 101 102  CO2 27 27 31  33*  GLUCOSE 112* 114* 91 82  BUN 13 11 16 16   CREATININE 0.98 0.76 0.90 0.75  CALCIUM  9.0 8.9 8.7* 9.0  MG  --   --  2.0  --    GFR: Estimated Creatinine Clearance: 69 mL/min (by C-G formula based on  SCr of 0.75 mg/dL). Recent Labs  Lab 02/02/24 1240 02/02/24 1433 02/03/24 0220 02/04/24 0256 02/05/24 0415  PROCALCITON <0.10  --   --   --   --   WBC 22.4*  --  23.8* 23.9* 18.5*  LATICACIDVEN  --  0.7  --   --   --     Liver Function Tests: Recent Labs  Lab 02/03/24 0220  AST 32  ALT 32  ALKPHOS 74  BILITOT 0.5  PROT 7.7  ALBUMIN 3.2*   No results for input(s): LIPASE, AMYLASE in the last 168 hours. No results for input(s): AMMONIA in the last 168 hours.  ABG    Component Value Date/Time   HCO3 30.2 (H) 12/17/2023 1700   TCO2 24 01/15/2021 2110   O2SAT 78.4 12/17/2023 1700     Coagulation Profile: Recent Labs  Lab 02/02/24 1433  INR 1.0    Cardiac Enzymes: No results for input(s): CKTOTAL, CKMB, CKMBINDEX, TROPONINI in the last 168 hours.  HbA1C: Hgb A1c MFr Bld  Date/Time Value Ref Range Status  05/16/2021 06:12 AM 6.0 (H) 4.8 - 5.6 % Final    Comment:    (NOTE) Pre diabetes:          5.7%-6.4%  Diabetes:              >6.4%  Glycemic control for   <7.0% adults with diabetes     CBG: No results for input(s): GLUCAP in the last 168 hours.  Review of Systems:   A 10 point review of  systems was performed and it is as noted above otherwise negative.  Allergies Allergies  Allergen Reactions   Tramadol Palpitations    Can't take Ultram ER - palpitations  Regular Tramadol is fine to take Can't take Ultram ER - palpitations  Regular Tramadol is fine to take    Amoxicillin  Diarrhea   Compazine [Prochlorperazine] Other (See Comments)    Excitability   Compazine [Prochlorperazine] Anxiety     Home Medications  Prior to Admission medications   Medication Sig Start Date End Date Taking? Authorizing Provider  acyclovir (ZOVIRAX) 400 MG tablet Take 400 mg by mouth 2 (two) times daily as needed (Flair up).   Yes [provider]  albuterol  (VENTOLIN  HFA) 108 (90 Base) MCG/ACT inhaler Inhale 2 puffs into the lungs every 6  (six) hours as needed for wheezing or shortness of breath.   Yes [provider]  ALPRAZolam  (XANAX ) 0.5 MG tablet Take 0.5 mg by mouth 3 (three) times daily as needed for anxiety.   Yes [provider]  amitriptyline  (ELAVIL ) 150 MG tablet Take 300 mg by mouth at bedtime. 12/31/20  Yes [provider]  atorvastatin  (LIPITOR) 20 MG tablet Take 1 tablet (20 mg total) by mouth daily. 05/26/23  Yes Onita Duos, MD  budesonide -formoterol  (SYMBICORT ) 160-4.5 MCG/ACT inhaler Inhale 2 puffs into the lungs in the morning and at bedtime. 12/29/23  Yes Dgayli, Belva, MD  clopidogrel  (PLAVIX ) 75 MG tablet Take 1 tablet (75 mg total) by mouth daily. 06/26/23  Yes Onita Duos, MD  docusate sodium (COLACE) 100 MG capsule Take 100 mg by mouth daily.   Yes [provider]  gabapentin  (NEURONTIN ) 600 MG tablet Take 900 mg by mouth QID. 01/12/21  Yes [provider]  ipratropium-albuterol  (DUONEB) 0.5-2.5 (3) MG/3ML SOLN Take 3 mLs by nebulization every 6 (six) hours as needed. 01/27/24  Yes Dgayli, Belva, MD  LATUDA  60 MG TABS Take 60 mg by mouth at bedtime. 01/03/21  Yes [provider]  levofloxacin (LEVAQUIN) 750 MG tablet Take 1 tablet (750 mg total) by mouth daily for 7 days. 01/27/24 02/03/24 Yes Dgayli, Belva, MD  montelukast  (SINGULAIR ) 10 MG tablet Take 1 tablet (10 mg total) by mouth at bedtime. 12/29/23  Yes Dgayli, Belva, MD  nicotine  (NICODERM CQ  - DOSED IN MG/24 HOURS) 21 mg/24hr patch Place 1 patch (21 mg total) onto the skin daily. 12/29/23 02/09/24 Yes Dgayli, Belva, MD  nicotine  polacrilex (NICOTINE  MINI) 2 MG lozenge Take 1 lozenge (2 mg total) by mouth every 2 (two) hours as needed for smoking cessation. 12/29/23 03/28/24 Yes Dgayli, Belva, MD  oxyCODONE -acetaminophen  (PERCOCET) 10-325 MG tablet Take 1 tablet by mouth every 6 (six) hours as needed. 08/06/15  Yes [provider]  oxymorphone (OPANA) 10 MG tablet Take 10 mg by mouth in the morning and at  bedtime.   Yes [provider]  pantoprazole  (PROTONIX ) 40 MG tablet Take 40 mg by mouth daily. 01/14/21  Yes [provider]  QUEtiapine  (SEROQUEL ) 100 MG tablet Take 100 mg by mouth at bedtime. 01/19/24  Yes [provider]  umeclidinium bromide  (INCRUSE ELLIPTA ) 62.5 MCG/ACT AEPB Inhale 1 puff into the lungs daily. 01/11/24  Yes Dgayli, Belva, MD  zolpidem (AMBIEN) 10 MG tablet Take 10 mg by mouth daily.   Yes [provider]   Scheduled Meds:  amitriptyline   300 mg Oral QHS   atorvastatin   20 mg Oral Daily   budesonide -glycopyrrolate-formoterol   2 puff Inhalation BID   clopidogrel   75 mg Oral Daily   docusate sodium  100 mg Oral Daily   enoxaparin (LOVENOX) injection  40 mg Subcutaneous Q24H   gabapentin   900 mg Oral QID   lurasidone   60 mg Oral QHS   morphine  30 mg Oral Q12H   nicotine   21 mg Transdermal Q24H   pantoprazole   40 mg Oral Daily   pneumococcal 20-valent conjugate vaccine  0.5 mL Intramuscular Once   predniSONE   40 mg Oral Q breakfast   zolpidem  5 mg Oral QHS   Continuous Infusions:  azithromycin  500 mg (02/04/24 1412)   ceFEPime  (MAXIPIME ) IV 2 g (02/05/24 0522)   PRN Meds:.albuterol , ALPRAZolam , nicotine  polacrilex, oxyCODONE -acetaminophen , senna    Level 3 F/U    Discussed with Dr. Dorinda in person.  Have arranged follow-up with Dr. Belva November on 10:22 at 1 PM  I spent 35 minutes of dedicated to the care of this patient on the date of this encounter to include pre-visit review of records, face-to-face time with the patient discussing conditions above, post visit ordering of testing, clinical documentation with the electronic health record, making appropriate referrals as documented, and communicating necessary findings to members of the patients care team.   C. Leita Sanders, MD Advanced Bronchoscopy PCCM [facility]    *This note was generated using voice recognition software/Dragon and/or AI  transcription program.  Despite best efforts to proofread, errors can occur which can change the meaning. Any transcriptional errors that result from this process are unintentional and may not be fully corrected at the time of dictation.

## 2024-02-05 NOTE — Plan of Care (Signed)
  Problem: Clinical Measurements: Goal: Will remain free from infection Outcome: Progressing Goal: Respiratory complications will improve Outcome: Progressing Goal: Cardiovascular complication will be avoided Outcome: Progressing   Problem: Activity: Goal: Risk for activity intolerance will decrease Outcome: Progressing   Problem: Nutrition: Goal: Adequate nutrition will be maintained Outcome: Progressing   Problem: Elimination: Goal: Will not experience complications related to bowel motility Outcome: Progressing Goal: Will not experience complications related to urinary retention Outcome: Progressing   Problem: Pain Managment: Goal: General experience of comfort will improve and/or be controlled Outcome: Progressing   Problem: Safety: Goal: Ability to remain free from injury will improve Outcome: Progressing   Problem: Skin Integrity: Goal: Risk for impaired skin integrity will decrease Outcome: Progressing   Problem: Coping: Goal: Level of anxiety will decrease Outcome: Not Progressing

## 2024-02-05 NOTE — Discharge Summary (Signed)
 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  Denies chest pain, palpitations, abdominal pain. patient has been recently treated for pneumonia, was seen by pulmonary Dr.Dgayli on 01/27/24, was being treated with Levaquin, prednisone , nebulizers for recent right middle lobe pneumonia   Presentation to ED found to be afebrile, RR 22, HR 98, blood pressure borderline low 90/73, saturating 93% on 4 L [place] Workup revealed BMP unremarkable except mild hyperglycemia, CBC with wbc 22.4, Hb 12.0, platelet count 456. LA not elevated D-dimer elevated Resp panel pending, Blood cultures sent in ER CXR shows improving bibasilar opacities with decreased atelectasis at left lung base, chronic bronchitic type bronchial wall thickening persists.  Was given dose of vancomycin , cefepime  in ER.  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 Continue current antibiotics Continue current oxygen requirement - CXR shows improving bibasilar opacities with decreased atelectasis at left lung base, chronic bronchitic type bronchial wall thickening persists - d-dimer elevated, leukocytosis Vancomycin  discontinued as MRSA was negative Antibiotics switched from cefepime  and azithromycin  to Augmentin  Discussed the case with pulmonologist today and cleared for discharge today    COPD exacerbation - On prednisone  40mg  daily - Home inhalers, Duonebs prn   Leukocytosis  - Multifactoral likely 2/2 pneumonia, also on steroid therapy - Monitor WBC   Tobacco use - Nicotine  patches and gums   Generalized anxiety and major depression - Home Xanax , Amitriptyline , Zolpidem   Chronic back pain - Resume home Gabapentin , Percocet, Oxymorphone     Consultants: Pulmonary Procedures performed: None Disposition: Home Diet recommendation:  Cardiac diet DISCHARGE MEDICATION: Allergies as of 02/05/2024       Reactions   Tramadol Palpitations   Can't take Ultram ER - palpitations Regular Tramadol is fine to take Can't take Ultram ER - palpitations Regular Tramadol is fine to take   Amoxicillin  Diarrhea   Compazine [prochlorperazine] Other (See Comments)   Excitability   Compazine [prochlorperazine] Anxiety        Medication List     STOP taking these medications    levofloxacin 750 MG tablet Commonly known as: Levaquin       TAKE these medications    acyclovir 400 MG tablet Commonly known as: ZOVIRAX Take 400 mg by mouth 2 (two) times daily as needed (Flair up).   albuterol  108 (90 Base) MCG/ACT inhaler  Commonly known as: VENTOLIN  HFA Inhale 2 puffs into the lungs every 6 (six) hours as needed for wheezing or  shortness of breath.   ALPRAZolam  0.5 MG tablet Commonly known as: XANAX  Take 0.5 mg by mouth 3 (three) times daily as needed for anxiety.   amitriptyline  150 MG tablet Commonly known as: ELAVIL  Take 300 mg by mouth at bedtime.   amoxicillin -clavulanate 875-125 MG tablet Commonly known as: AUGMENTIN  Take 1 tablet by mouth 2 (two) times daily for 7 days.   atorvastatin  20 MG tablet Commonly known as: Lipitor Take 1 tablet (20 mg total) by mouth daily.   Breztri Aerosphere 160-9-4.8 MCG/ACT Aero inhaler Generic drug: budesonide -glycopyrrolate-formoterol  Inhale 2 puffs into the lungs in the morning and at bedtime.   clopidogrel  75 MG tablet Commonly known as: PLAVIX  Take 1 tablet (75 mg total) by mouth daily.   docusate sodium 100 MG capsule Commonly known as: COLACE Take 100 mg by mouth daily.   gabapentin  600 MG tablet Commonly known as: NEURONTIN  Take 900 mg by mouth QID.   ipratropium-albuterol  0.5-2.5 (3) MG/3ML Soln Commonly known as: DUONEB Take 3 mLs by nebulization every 6 (six) hours as needed.   Latuda  60 MG Tabs Generic drug: Lurasidone  HCl Take 60 mg by mouth at bedtime.   montelukast  10 MG tablet Commonly known as: SINGULAIR  Take 1 tablet (10 mg total) by mouth at bedtime.   nicotine  21 mg/24hr patch Commonly known as: NICODERM CQ  - dosed in mg/24 hours Place 1 patch (21 mg total) onto the skin daily.   nicotine  polacrilex 2 MG lozenge Commonly known as: Nicotine  Mini Take 1 lozenge (2 mg total) by mouth every 2 (two) hours as needed for smoking cessation.   oxyCODONE -acetaminophen  10-325 MG tablet Commonly known as: PERCOCET Take 1 tablet by mouth every 6 (six) hours as needed.   oxymorphone 10 MG tablet Commonly known as: OPANA Take 10 mg by mouth in the morning and at bedtime.   pantoprazole  40 MG tablet Commonly known as: PROTONIX  Take 40 mg by mouth daily.   predniSONE  20 MG tablet Commonly known as: DELTASONE  Take 2 tablets (40 mg  total) by mouth daily with breakfast for 5 days. Start taking on: February 06, 2024   QUEtiapine  100 MG tablet Commonly known as: SEROQUEL  Take 100 mg by mouth at bedtime.   zolpidem 10 MG tablet Commonly known as: AMBIEN Take 10 mg by mouth daily.        Discharge Exam: Filed Weights   02/02/24 1237  Weight: 54.4 kg    General: Awake, no distress. On supplemental oxygen CV:  Tachycardic, no murmurs Resp:   Coarse breath sounds bilaterally, mild wheezing present Abd: Soft, nontender, nondistended Neuro: AO x 3, no gross focal deficit  Condition at discharge: good  The results of significant diagnostics from this hospitalization (including imaging, microbiology, ancillary and laboratory) are listed below for reference.   Imaging Studies: DG Chest 2 View Result Date: 02/05/2024 EXAM: 2 VIEW(S) XRAY OF THE CHEST 02/05/2024 08:02:00 AM COMPARISON: 02/02/2024 CLINICAL HISTORY: Pneumonia per ordering notes. Patient reports SOB and cough. FINDINGS: LUNGS AND PLEURA: Increased left basilar opacity is noted suggesting atelectasis or infiltrate with possible small pleural effusion. No pulmonary edema. No pneumothorax. HEART AND MEDIASTINUM: Stable cardiomediastinal silhouette. Status post aortic valve repair. Sternotomy wires are noted. Some mediastinal shift to the left is noted. BONES AND SOFT TISSUES: Stable old mid thoracic compression fracture. IMPRESSION: 1. Increased left basilar opacity, which may reflect atelectasis or infection,  with possible small left pleural effusion. 2. Leftward mediastinal shift. 3. Stable postoperative changes from aortic valve repair and sternotomy. 4. Stable chronic mid-thoracic compression fracture. Electronically signed by: Lynwood Seip MD 02/05/2024 09:23 AM EDT RP Workstation: [id number]   ECHOCARDIOGRAM COMPLETE Result Date: 02/03/2024    ECHOCARDIOGRAM REPORT   Patient Name:   Courtney Mayer Date of Exam: 02/03/2024 Medical Rec #:  [id number]        Height:       65.0 in Accession #:    [id number]      Weight:       120.0 lb Date of Birth:  [birth year]      BSA:          1.592 m Patient Age:    52 years        BP:           124/90 mmHg Patient Gender: F               HR:           88 bpm. Exam Location:  Inpatient Procedure: 2D Echo, Cardiac Doppler and Color Doppler (Both Spectral and Color            Flow Doppler were utilized during procedure). Indications:     Pulmonary hypertension  History:         Patient has prior history of Echocardiogram examinations, most                  recent 05/16/2021. COPD.  Sonographer:     Philomena Daring Referring Phys:  [id number] CARMEN L GONZALEZ Diagnosing Phys: Evalene Lunger MD IMPRESSIONS  1. Left ventricular ejection fraction, by estimation, is 60 to 65%. The left ventricle has normal function. The left ventricle has no regional wall motion abnormalities. Left ventricular diastolic parameters were normal.  2. Right ventricular systolic function is normal. The right ventricular size is moderately enlarged. There is normal pulmonary artery systolic pressure. The estimated right ventricular systolic pressure is 15.9 mmHg.  3. The mitral valve is normal in structure. Moderate mitral valve regurgitation. No evidence of mitral stenosis.  4. The aortic valve is normal in structure. Aortic valve regurgitation is not visualized. No aortic stenosis is present.  5. The inferior vena cava is normal in size with greater than 50% respiratory variability, suggesting right atrial pressure of 3 mmHg. FINDINGS  Left Ventricle: Left ventricular ejection fraction, by estimation, is 60 to 65%. The left ventricle has normal function. The left ventricle has no regional wall motion abnormalities. Strain was performed and the global longitudinal strain is indeterminate. The left ventricular internal cavity size was normal in size. There is no left ventricular hypertrophy. Left ventricular diastolic parameters were normal. Right Ventricle: The right  ventricular size is moderately enlarged. No increase in right ventricular wall thickness. Right ventricular systolic function is normal. There is normal pulmonary artery systolic pressure. The tricuspid regurgitant velocity is 1.65 m/s, and with an assumed right atrial pressure of 5 mmHg, the estimated right ventricular systolic pressure is 15.9 mmHg. Left Atrium: Left atrial size was normal in size. Right Atrium: Right atrial size was normal in size. Pericardium: There is no evidence of pericardial effusion. Mitral Valve: The mitral valve is normal in structure. Moderate mitral valve regurgitation. No evidence of mitral valve stenosis. Tricuspid Valve: The tricuspid valve is normal in structure. Tricuspid valve regurgitation is mild . No evidence of tricuspid stenosis. Aortic Valve: The aortic valve is normal in structure. Aortic valve  regurgitation is not visualized. No aortic stenosis is present. Aortic valve mean gradient measures 16.0 mmHg. Aortic valve peak gradient measures 28.0 mmHg. Aortic valve area, by VTI measures 1.27 cm. Pulmonic Valve: The pulmonic valve was normal in structure. Pulmonic valve regurgitation is mild. No evidence of pulmonic stenosis. Aorta: The aortic root is normal in size and structure. Venous: The inferior vena cava is normal in size with greater than 50% respiratory variability, suggesting right atrial pressure of 3 mmHg. IAS/Shunts: No atrial level shunt detected by color flow Doppler. Additional Comments: 3D was performed not requiring image post processing on an independent workstation and was indeterminate.  LEFT VENTRICLE PLAX 2D LVIDd:         4.60 cm   Diastology LVIDs:         2.80 cm   LV e' medial:    16.10 cm/s LV PW:         0.90 cm   LV E/e' medial:  7.3 LV IVS:        0.90 cm   LV e' lateral:   15.20 cm/s LVOT diam:     1.80 cm   LV E/e' lateral: 7.8 LV SV:         63 LV SV Index:   40 LVOT Area:     2.54 cm  RIGHT VENTRICLE             IVC RV S prime:     12.70  cm/s  IVC diam: 2.00 cm TAPSE (M-mode): 1.7 cm LEFT ATRIUM             Index        RIGHT ATRIUM           Index LA diam:        3.10 cm 1.95 cm/m   RA Area:     12.80 cm LA Vol (A2C):   34.9 ml 21.92 ml/m  RA Volume:   28.10 ml  17.65 ml/m LA Vol (A4C):   31.5 ml 19.79 ml/m LA Biplane Vol: 34.6 ml 21.73 ml/m  AORTIC VALVE                     PULMONIC VALVE AV Area (Vmax):    1.24 cm      PR End Diast Vel: 10.89 msec AV Area (Vmean):   1.22 cm AV Area (VTI):     1.27 cm AV Vmax:           264.75 cm/s AV Vmean:          185.250 cm/s AV VTI:            0.497 m AV Peak Grad:      28.0 mmHg AV Mean Grad:      16.0 mmHg LVOT Vmax:         129.50 cm/s LVOT Vmean:        88.850 cm/s LVOT VTI:          0.248 m LVOT/AV VTI ratio: 0.50  AORTA Ao Root diam: 2.80 cm MITRAL VALVE                TRICUSPID VALVE MV Area (PHT): 4.29 cm     TR Peak grad:   10.9 mmHg MV Decel Time: 177 msec     TR Vmax:        165.00 cm/s MV E velocity: 118.00 cm/s MV A velocity: 86.50 cm/s   SHUNTS MV E/A ratio:  1.36  Systemic VTI:  0.25 m                             Systemic Diam: 1.80 cm Timothy Gollan MD Electronically signed by Evalene Lunger MD Signature Date/Time: 02/03/2024/5:38:55 PM    Final    CT Angio Chest Pulmonary Embolism (PE) W or WO Contrast Result Date: 02/02/2024 CLINICAL DATA:  Rule out pulmonary embolus and evaluate pneumonia. Recent treatment for pneumonia. Persistent shortness of breath, not improved on treatment. Home oxygen use with decreasing oxygen saturations. EXAM: CT ANGIOGRAPHY CHEST WITH CONTRAST TECHNIQUE: Multidetector CT imaging of the chest was performed using the standard protocol during bolus administration of intravenous contrast. Multiplanar CT image reconstructions and MIPs were obtained to evaluate the vascular anatomy. RADIATION DOSE REDUCTION: This exam was performed according to the departmental dose-optimization program which includes automated exposure control, adjustment of the mA  and/or kV according to patient size and/or use of iterative reconstruction technique. CONTRAST:  75mL OMNIPAQUE  IOHEXOL  350 MG/ML SOLN COMPARISON:  Chest radiograph 02/02/2024.  CT chest 12/15/2023 FINDINGS: Cardiovascular: Technically adequate study with good opacification of the central and segmental pulmonary arteries. Mild motion artifact. No focal filling defects. No evidence of significant pulmonary embolus. Central pulmonary arteries are dilated suggesting pulmonary arterial hypertension. Cardiac enlargement. No pericardial effusions. Normal caliber thoracic aorta. No aortic dissection. Calcification of the aorta and coronary arteries. Previous postoperative changes with aortic valve repair. Mediastinum/Nodes: Esophagus is mildly distended with an air-fluid level present. This is nonspecific but could indicate dysmotility. No obstructing lesions are demonstrated. Thyroid gland is unremarkable. Mediastinal lymph nodes are not pathologically enlarged, likely reactive. Lungs/Pleura: Severe emphysematous changes and fibrosis throughout the lungs. Patchy airspace disease demonstrated throughout both lungs, likely multifocal pneumonia but could indicate aspiration. Bronchiectasis with bronchial wall thickening and lower lobe mucous plugging may indicate chronic bronchitis or aspiration. Multiple nodular opacities in the lungs. Largest is in the left lung, series 5, image 52, measuring 8 mm diameter. Nodular changes are new since prior study and infiltrative changes have increased. This suggest these most likely represent acute inflammatory process. Follow-up after resolution is recommended to exclude underlying developing pulmonary nodules. No pleural effusion or pneumothorax. Upper Abdomen: No acute abnormalities. Musculoskeletal: Sternotomy wires are present. Compression deformities of multiple thoracic vertebrae, unchanged. No acute bony abnormalities. Review of the MIP images confirms the above findings.  IMPRESSION: 1. No evidence of significant pulmonary embolus. Dilated central pulmonary arteries suggest pulmonary arterial hypertension. 2. Cardiac enlargement.  Aortic atherosclerosis. 3. Severe diffuse emphysematous changes and fibrosis throughout the lungs. 4. New patchy airspace disease throughout both lungs with developing nodular foci, bronchial wall thickening, bronchiectasis, and mucous plugging. Changes most likely to represent multifocal pneumonia with bronchitis or aspiration. Follow-up after resolution of the acute process is recommended. Mildly distended esophagus with air-fluid level, likely reflux or dysmotility. 5. Electronically Signed   By: Elsie Gravely M.D.   On: 02/02/2024 17:20   DG Chest 2 View Result Date: 02/02/2024 EXAM: 2 VIEW(S) XRAY OF THE CHEST 02/02/2024 01:05:46 PM COMPARISON: Comparison exam 01/05/2024. CLINICAL HISTORY: shortness of breath. Shortness of breath shortness of breath. Shortness of breath FINDINGS: LUNGS AND PLEURA: Some improvement in bibasilar opacities. Chronic bronchial markings remain. Improved atelectasis of the left lung base. No pulmonary edema. No pleural effusion. No pneumothorax. HEART AND MEDIASTINUM: No acute abnormality of the cardiac and mediastinal silhouettes. BONES AND SOFT TISSUES: Midline sternotomy. No acute osseous abnormality. IMPRESSION: 1. Improving  bibasilar opacities with decreased atelectasis at the left lung base. 2. Chronic bronchitic-type bronchial wall thickening persists. Electronically signed by: Norleen Boxer MD 02/02/2024 02:01 PM EDT RP Workstation: [id number]    Microbiology: Results for orders placed or performed during the hospital encounter of 02/02/24  Blood Culture (routine x 2)     Status: None (Preliminary result)   Collection Time: 02/02/24  2:33 PM   Specimen: BLOOD  Result Value Ref Range Status   Specimen Description BLOOD BLOOD RIGHT FOREARM  Final   Special Requests   Final    BOTTLES DRAWN AEROBIC AND  ANAEROBIC Blood Culture adequate volume   Culture   Final    NO GROWTH 3 DAYS Performed at [facility], [address] [place], [place] [address]    Report Status PENDING  Incomplete  Resp panel by RT-PCR (RSV, Flu A&B, Covid) Anterior Nasal Swab     Status: None   Collection Time: 02/02/24  2:33 PM   Specimen: Anterior Nasal Swab  Result Value Ref Range Status   SARS Coronavirus 2 by RT PCR NEGATIVE NEGATIVE Final    Comment: (NOTE) SARS-CoV-2 target nucleic acids are NOT DETECTED.  The SARS-CoV-2 RNA is generally detectable in upper respiratory specimens during the acute phase of infection. The lowest concentration of SARS-CoV-2 viral copies this assay can detect is 138 copies/mL. A negative result does not preclude SARS-Cov-2 infection and should not be used as the sole basis for treatment or other patient management decisions. A negative result may occur with  improper specimen collection/handling, submission of specimen other than nasopharyngeal swab, presence of viral mutation(s) within the areas targeted by this assay, and inadequate number of viral copies(<138 copies/mL). A negative result must be combined with clinical observations, patient history, and epidemiological information. The expected result is Negative.  Fact Sheet for Patients:  BloggerCourse.com  Fact Sheet for Healthcare Providers:  SeriousBroker.it  This test is no t yet approved or cleared by the [place]  FDA and  has been authorized for detection and/or diagnosis of SARS-CoV-2 by FDA under an Emergency Use Authorization (EUA). This EUA will remain  in effect (meaning this test can be used) for the duration of the COVID-19 declaration under Section 564(b)(1) of the Act, 21 U.S.C.section 360bbb-3(b)(1), unless the authorization is terminated  or revoked sooner.       Influenza A by PCR NEGATIVE NEGATIVE Final   Influenza B by PCR  NEGATIVE NEGATIVE Final    Comment: (NOTE) The Xpert Xpress SARS-CoV-2/FLU/RSV plus assay is intended as an aid in the diagnosis of influenza from Nasopharyngeal swab specimens and should not be used as a sole basis for treatment. Nasal washings and aspirates are unacceptable for Xpert Xpress SARS-CoV-2/FLU/RSV testing.  Fact Sheet for Patients: BloggerCourse.com  Fact Sheet for Healthcare Providers: SeriousBroker.it  This test is not yet approved or cleared by the [place]  FDA and has been authorized for detection and/or diagnosis of SARS-CoV-2 by FDA under an Emergency Use Authorization (EUA). This EUA will remain in effect (meaning this test can be used) for the duration of the COVID-19 declaration under Section 564(b)(1) of the Act, 21 U.S.C. section 360bbb-3(b)(1), unless the authorization is terminated or revoked.     Resp Syncytial Virus by PCR NEGATIVE NEGATIVE Final    Comment: (NOTE) Fact Sheet for Patients: BloggerCourse.com  Fact Sheet for Healthcare Providers: SeriousBroker.it  This test is not yet approved or cleared by the [place]  FDA and has been authorized for detection and/or diagnosis  of SARS-CoV-2 by FDA under an Emergency Use Authorization (EUA). This EUA will remain in effect (meaning this test can be used) for the duration of the COVID-19 declaration under Section 564(b)(1) of the Act, 21 U.S.C. section 360bbb-3(b)(1), unless the authorization is terminated or revoked.  Performed at [facility], [address] [place], [place] [address]   Blood Culture (routine x 2)     Status: None (Preliminary result)   Collection Time: 02/02/24  2:35 PM   Specimen: BLOOD  Result Value Ref Range Status   Specimen Description BLOOD BLOOD LEFT FOREARM  Final   Special Requests   Final    BOTTLES DRAWN AEROBIC AND ANAEROBIC Blood Culture results  may not be optimal due to an inadequate volume of blood received in culture bottles   Culture   Final    NO GROWTH 3 DAYS Performed at [facility], [address], [place], [place] [address]    Report Status PENDING  Incomplete  MRSA Next Gen by PCR, Nasal     Status: None   Collection Time: 02/02/24  7:11 PM   Specimen: Nasal Mucosa; Nasal Swab  Result Value Ref Range Status   MRSA by PCR Next Gen NOT DETECTED NOT DETECTED Final    Comment: (NOTE) The GeneXpert MRSA Assay (FDA approved for NASAL specimens only), is one component of a comprehensive MRSA colonization surveillance program. It is not intended to diagnose MRSA infection nor to guide or monitor treatment for MRSA infections. Test performance is not FDA approved in patients less than 18 years old. Performed at [facility], [address] [place], [place] [address]   Respiratory (~20 pathogens) panel by PCR     Status: None   Collection Time: 02/02/24  7:11 PM   Specimen: Nasopharyngeal Swab; Respiratory  Result Value Ref Range Status   Adenovirus NOT DETECTED NOT DETECTED Final   Coronavirus 229E NOT DETECTED NOT DETECTED Final    Comment: (NOTE) The Coronavirus on the Respiratory Panel, DOES NOT test for the novel  Coronavirus (2019 nCoV)    Coronavirus HKU1 NOT DETECTED NOT DETECTED Final   Coronavirus NL63 NOT DETECTED NOT DETECTED Final   Coronavirus OC43 NOT DETECTED NOT DETECTED Final   Metapneumovirus NOT DETECTED NOT DETECTED Final   Rhinovirus / Enterovirus NOT DETECTED NOT DETECTED Final   Influenza A NOT DETECTED NOT DETECTED Final   Influenza B NOT DETECTED NOT DETECTED Final   Parainfluenza Virus 1 NOT DETECTED NOT DETECTED Final   Parainfluenza Virus 2 NOT DETECTED NOT DETECTED Final   Parainfluenza Virus 3 NOT DETECTED NOT DETECTED Final   Parainfluenza Virus 4 NOT DETECTED NOT DETECTED Final   Respiratory Syncytial Virus NOT DETECTED NOT DETECTED Final   Bordetella pertussis  NOT DETECTED NOT DETECTED Final   Bordetella Parapertussis NOT DETECTED NOT DETECTED Final   Chlamydophila pneumoniae NOT DETECTED NOT DETECTED Final   Mycoplasma pneumoniae NOT DETECTED NOT DETECTED Final    Comment: Performed at [facility], [address], [place], [place] [address]    Labs: CBC: Recent Labs  Lab 02/02/24 1240 02/03/24 0220 02/04/24 0256 02/05/24 0415  WBC 22.4* 23.8* 23.9* 18.5*  NEUTROABS  --   --  18.3* 13.2*  HGB 12.0 12.0 10.8* 10.7*  HCT 37.1 37.9 33.7* 33.6*  MCV 98.9 100.8* 99.4 99.7  PLT 456* 426* 364 389   Basic Metabolic Panel: Recent Labs  Lab 02/02/24 1240 02/03/24 0220 02/04/24 0256 02/05/24 0415  NA 137 138  139 142  K 4.4 4.2 4.0 4.3  CL 99 100 101 102  CO2 27 27 31  33*  GLUCOSE 112* 114* 91 82  BUN 13 11 16 16   CREATININE 0.98 0.76 0.90 0.75  CALCIUM  9.0 8.9 8.7* 9.0  MG  --   --  2.0  --    Liver Function Tests: Recent Labs  Lab 02/03/24 0220  AST 32  ALT 32  ALKPHOS 74  BILITOT 0.5  PROT 7.7  ALBUMIN 3.2*   CBG: No results for input(s): GLUCAP in the last 168 hours.  Discharge time spent:  .  Signed: Drue ONEIDA Potter, MD [facility] 02/05/2024

## 2024-02-05 NOTE — Progress Notes (Signed)
 Patient to be discharged today after COPD exacerbation and aspiration pneumonia.  Ordered Breztri to her pharmacy.  Also place consult to gastroenterology for evaluation of potential esophageal dysmotility.  Scleroderma screening pending.  KYM Leita Sanders, MD Advanced Bronchoscopy PCCM [facility]

## 2024-02-07 LAB — CULTURE, BLOOD (ROUTINE X 2)
Culture: NO GROWTH
Culture: NO GROWTH
Special Requests: ADEQUATE

## 2024-02-08 ENCOUNTER — Ambulatory Visit: Payer: Self-pay | Admitting: Pulmonary Disease

## 2024-02-08 LAB — ALPHA-1-ANTITRYPSIN PHENOTYP: A-1 Antitrypsin, Ser: 228 mg/dL — ABNORMAL HIGH (ref 101–187)

## 2024-02-09 NOTE — Progress Notes (Signed)
 ATC the patient. Her mailbox was full and I could not leave a message.  LMTCB. E2C2 please advise when patient calls back.

## 2024-02-11 LAB — SUSCEPTIBILITY, AER + ANAEROB

## 2024-02-17 ENCOUNTER — Encounter: Payer: Self-pay | Admitting: Student in an Organized Health Care Education/Training Program

## 2024-02-17 ENCOUNTER — Ambulatory Visit: Admitting: Student in an Organized Health Care Education/Training Program

## 2024-02-17 VITALS — BP 102/74 | HR 88 | Temp 98.1°F | Ht 65.0 in | Wt 108.6 lb

## 2024-02-17 DIAGNOSIS — F17210 Nicotine dependence, cigarettes, uncomplicated: Secondary | ICD-10-CM | POA: Diagnosis not present

## 2024-02-17 DIAGNOSIS — F17200 Nicotine dependence, unspecified, uncomplicated: Secondary | ICD-10-CM

## 2024-02-17 DIAGNOSIS — J449 Chronic obstructive pulmonary disease, unspecified: Secondary | ICD-10-CM | POA: Diagnosis not present

## 2024-02-17 DIAGNOSIS — Z23 Encounter for immunization: Secondary | ICD-10-CM | POA: Diagnosis not present

## 2024-02-17 MED ORDER — BREZTRI AEROSPHERE 160-9-4.8 MCG/ACT IN AERO: 2.0000 | INHALATION_SPRAY | Freq: Two times a day (BID) | RESPIRATORY_TRACT | 11 refills | Status: AC

## 2024-02-17 NOTE — Progress Notes (Signed)
 Assessment & Plan:   #Chronic obstructive pulmonary disease  Presents for post hospital discharge follow up, with symptoms overall consistent with COPD. Previous spirometry at her prior pulmonary provider was consistent with COPD. Max eosinophil count was 400 in 2022. She is currently maintained on ICS/LABA/LAMA with breztri to good effect, and continues on montelukast . She was recently admitted for a COPD exacerbation which was treated with steroids and antibiotics with improvement. Counseled the patient on the importance of smoking cessation. Alpha-1 (MS, levels normal). Her RML collapse appears improved with most recent CT while hospitalized showing re-expansion.  - budesonide -glycopyrrolate-formoterol  (BREZTRI AEROSPHERE) 160-9-4.8 MCG/ACT AERO inhaler; Inhale 2 puffs into the lungs in the morning and at bedtime.  Dispense: 10.7 g; Refill: 11 - consider ensifentrine  on follow up - flu vaccine today  #Tobacco use disorder  Recommended she completely abstain from smoking. Will refer to lung cancer screening for screening CT.  - Ambulatory Referral for Lung Cancer Screening  #Pneumonia of right middle lobe due to infectious organism   She was noted to have RML collapse on a recent chest CT. On my review, the RML was collapsed with some narrowing in the lateral segment of the RML (RB4). This was not present on a previous. I suspect this is due to mucus plugging. Bronchoscopy did not show any endobronchial lesions or stenosis. Cultures did grow multiple organisms including pseudomonas which was treated with antibiotics. Repeat CT while hospitalized showed the RML to be re-inflated. We will continue to monitor this on follow up.   Return in about 3 months (around 05/19/2024).  I spent 35 minutes caring for this patient today, including preparing to see the patient, obtaining a medical history , reviewing a separately obtained history, performing a medically appropriate examination and/or  evaluation, counseling and educating the patient/family/caregiver, ordering medications, tests, or procedures, documenting clinical information in the electronic health record, and independently interpreting results (not separately reported/billed) and communicating results to the patient/family/caregiver  Belva November, MD [facility]   End of visit medications:  Meds ordered this encounter  Medications   budesonide -glycopyrrolate-formoterol  (BREZTRI AEROSPHERE) 160-9-4.8 MCG/ACT AERO inhaler    Sig: Inhale 2 puffs into the lungs in the morning and at bedtime.    Dispense:  10.7 g    Refill:  11     Current Outpatient Medications:    acyclovir (ZOVIRAX) 400 MG tablet, Take 400 mg by mouth 2 (two) times daily as needed (Flair up)., Disp: , Rfl:    albuterol  (VENTOLIN  HFA) 108 (90 Base) MCG/ACT inhaler, Inhale 2 puffs into the lungs every 6 (six) hours as needed for wheezing or shortness of breath., Disp: , Rfl:    ALPRAZolam  (XANAX ) 0.5 MG tablet, Take 0.5 mg by mouth 3 (three) times daily as needed for anxiety., Disp: , Rfl:    amitriptyline  (ELAVIL ) 150 MG tablet, Take 300 mg by mouth at bedtime., Disp: , Rfl:    atorvastatin  (LIPITOR) 20 MG tablet, Take 1 tablet (20 mg total) by mouth daily., Disp: 90 tablet, Rfl: 3   clopidogrel  (PLAVIX ) 75 MG tablet, Take 1 tablet (75 mg total) by mouth daily., Disp: 90 tablet, Rfl: 3   docusate sodium (COLACE) 100 MG capsule, Take 100 mg by mouth daily., Disp: , Rfl:    gabapentin  (NEURONTIN ) 600 MG tablet, Take 900 mg by mouth QID., Disp: , Rfl:    ipratropium-albuterol  (DUONEB) 0.5-2.5 (3) MG/3ML SOLN, Take 3 mLs by nebulization every 6 (six) hours as needed., Disp: 360  mL, Rfl: 2   LATUDA  60 MG TABS, Take 60 mg by mouth at bedtime., Disp: , Rfl:    montelukast  (SINGULAIR ) 10 MG tablet, Take 1 tablet (10 mg total) by mouth at bedtime., Disp: 30 tablet, Rfl: 12   nicotine  polacrilex (NICOTINE  MINI) 2 MG lozenge, Take 1 lozenge  (2 mg total) by mouth every 2 (two) hours as needed for smoking cessation., Disp: 72 lozenge, Rfl: 3   oxyCODONE -acetaminophen  (PERCOCET) 10-325 MG tablet, Take 1 tablet by mouth every 6 (six) hours as needed., Disp: , Rfl:    oxymorphone (OPANA) 10 MG tablet, Take 10 mg by mouth in the morning and at bedtime., Disp: , Rfl:    pantoprazole  (PROTONIX ) 40 MG tablet, Take 40 mg by mouth daily., Disp: , Rfl:    QUEtiapine  (SEROQUEL ) 100 MG tablet, Take 100 mg by mouth at bedtime., Disp: , Rfl:    zolpidem (AMBIEN) 10 MG tablet, Take 10 mg by mouth daily., Disp: , Rfl:    budesonide -glycopyrrolate-formoterol  (BREZTRI AEROSPHERE) 160-9-4.8 MCG/ACT AERO inhaler, Inhale 2 puffs into the lungs in the morning and at bedtime., Disp: 10.7 g, Rfl: 11   Subjective:   PATIENT ID: Courtney Mayer GENDER: female DOB: September 28, 1970, MRN: [id number]  Chief Complaint  Patient presents with   Hospitalization Follow-up    Shortness of breath on exertion. No cough or wheezing.     HPI  Patient is a pleasant 53 year old female with a past medical history of left upper lobe lung cancer status post lobectomy and recurrent pneumonias and COPD exacerbations presenting for post hospital discharge follow up.  She has a history of chronic hypoxic respiratory failure maintained on 2 L of oxygen via nasal cannula with exertion and with sleep.  She is also an active smoker.  She has been having recurrent pneumonias over the past year followed by her outpatient pulmonologist at [facility]. She has required multiple courses of antibiotics (levofloxacin, azithromycin , augmentin ) as well as prednisone .   She developed symptoms in August 2025 similar to her previous infections, with increased shortness of breath and productive cough. She reached out to her outpatient providers and a repeat chest CT was ordered. CT showed atelectasis of the right middle lobe with mucous plugging versus obstructing central neoplasm as well as patchy  and nodular opacities throughout the right lower lobe as well as the left lung.  There was also significant emphysema.   ED Visit on 12/17/2023:   She was at baseline and workup was only notable for leukocytosis with a white count of 11.2K. blood gas showed normal pH at 7.38 with mild hypercapnia at 51. She was discharged with a course of antibiotics and short course of prednisone . Plan was to undergo flexible bronchoscopy outpatient the following week but this was cancelled given clopidogrel  was not held.   Return Visit 12/31/2023:   She presents for follow up, and feels well at baseline. She has relapsed and currently smoking 1.5 packs a day. She has not had any change in symptoms, with no increase in shortness of breath, cough, or sputum production. She is not using her home inhalers.   Return Visit 01/27/2024:   She is accompanied by her mother, and both report that the patient has been a little more confused and more lethargic over the past couple of days. She has some increased shortness of breath as well as an increase in her cough. She has had to use her oxygen more often. She is producing more sputum. She is conversational  during the interview and is able to maintain her stream of thought. She reports that these are symptoms similar to how her exacerbations usually start. She did take the antibiotics previously prescribed by us  after her bronchoscopy, but didn't feel a notable difference.  Admission 02/02/2024:   COPD Exacerbation. Treated with IV antibiotics and steroids. Alpha-1 phenotype (MS, levels normal). She was switched to Breztri.  Return Visit 02/17/2024:  Feels much better since discharge, and is back near her baseline. Her shortness of breath persists, and she continues to cough. She does feel improved compared to her last visit with us  in clinic. She unfortunately continues to smoke up to 1 pack a day. She has been compliant with Breztri which she feels works for her.   The  medical record also notable for admission to [facility] in May 2024 for toxic metabolic encephalopathy due to acute hypoxic and hypercapnic respiratory failure secondary to pneumonia.  At that time, she was treated with broad-spectrum antibiotics. 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 She had an AVR (bioprosthetic)   Patient is an active smoker, currently smoking between 1.5 and 2 packs/day. 80 pack year of smoking history reported.  Ancillary information including prior medications, full medical/surgical/family/social histories, and PFTs (when available) are listed below and have been reviewed.    Review of Systems  Constitutional:  Negative for chills, fever and weight loss.  Respiratory:  Positive for cough, sputum production, shortness of breath and wheezing. Negative for hemoptysis.   Cardiovascular:  Negative for chest pain.     Objective:   Vitals:   02/17/24 1307  BP: 102/74  Pulse: 88  Temp: 98.1 F (36.7 C)  TempSrc: Temporal  SpO2: 97%  Weight: 108 lb 9.6 oz (49.3 kg)  Height: 5' 5 (1.651 m)   97% on 3 LPM BMI Readings from Last 3 Encounters:  02/17/24 18.07 kg/m  02/02/24 19.97 kg/m  01/27/24 18.90 kg/m   Wt Readings from Last 3 Encounters:  02/17/24 108 lb 9.6 oz (49.3 kg)  02/02/24 120 lb (54.4 kg)  01/27/24 113 lb 9.6 oz (51.5 kg)    Physical Exam Constitutional:      Appearance: Normal appearance.  Cardiovascular:     Rate and Rhythm: Normal rate and regular rhythm.     Pulses: Normal pulses.     Heart sounds: Normal heart sounds.  Pulmonary:     Effort: Pulmonary effort is normal.     Breath sounds: No wheezing.     Comments: Decreased breath sounds bilaterally Neurological:     General: No focal deficit present.     Mental Status: She is alert and oriented to person, place, and time. Mental status is at baseline.       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   Cerebrovascular accident (CVA) due to occlusion of left middle cerebral artery (HCC)    COPD (chronic obstructive pulmonary disease) (HCC)    Pneumonia of right middle lobe due to infectious organism 12/17/2023     Family History  Problem Relation Age of Onset   Healthy Mother    Healthy Father      Past Surgical History:  Procedure Laterality Date   atrial valve replacement  BREAST SURGERY Right    cyst removal   BUBBLE STUDY  06/19/2021   Procedure: BUBBLE STUDY;  Surgeon: Alveta Aleene PARAS, MD;  Location: [facility] ENDOSCOPY;  Service: Cardiovascular;;   CESAREAN SECTION     FLEXIBLE BRONCHOSCOPY Bilateral 01/05/2024   Procedure: ELLIOTT SIDE;  Surgeon: Isadora Hose, MD;  Location: [facility] ORS;  Service: Pulmonary;  Laterality: Bilateral;   LOBECTOMY     LUNG SURGERY Left    TEE WITHOUT CARDIOVERSION N/A 06/19/2021   Procedure: TRANSESOPHAGEAL ECHOCARDIOGRAM (TEE);  Surgeon: Alveta, Aleene PARAS, MD;  Location: [facility] ENDOSCOPY;  Service: Cardiovascular;  Laterality: N/A;   WRIST FRACTURE SURGERY      Social History   Socioeconomic History   Marital status: Single    Spouse name: Not on file   Number of children: Not on file   Years of education: Not on file   Highest education level: Not on file  Occupational History   Not on file  Tobacco Use   Smoking status: Every Day    Current packs/day: 2.00    Average packs/day: 2.0 packs/day for 39.8 years (79.6 ttl pk-yrs)    Types: Cigarettes    Start date: 23   Smokeless tobacco: Never  Vaping Use   Vaping status: Every Day   Substances: Nicotine   Substance and Sexual Activity   Alcohol use: Not Currently   Drug use: Yes    Types: Marijuana   Sexual activity: Not on file  Other Topics Concern   Not on file   Social History Narrative   Right handed   Caffiene none   Lives with mom,    widowed   Social Drivers of Health   Financial Resource Strain: Low Risk  (08/14/2021)   Received from [facility]   Overall Financial Resource Strain (CARDIA)    Difficulty of Paying Living Expenses: Not hard at all  Food Insecurity: No Food Insecurity (02/03/2024)   Hunger Vital Sign    Worried About Running Out of Food in the Last Year: Never true    Ran Out of Food in the Last Year: Never true  Transportation Needs: No Transportation Needs (02/03/2024)   PRAPARE - Administrator, Civil Service (Medical): No    Lack of Transportation (Non-Medical): No  Physical Activity: Unknown (08/04/2021)   Received from [facility]   Exercise Vital Sign    On average, how many days per week do you engage in moderate to strenuous exercise (like a brisk walk)?: 0 days    Minutes of Exercise per Session: Not on file  Stress: Stress Concern Present (09/18/2022)   Received from [facility] - Occupational Stress Questionnaire    Feeling of Stress : To some extent  Social Connections: Somewhat Isolated (08/04/2021)   [facility] from [facility]   Social Network    How would you rate your social network (family, work, friends)?: Restricted participation with some degree of social isolation  Intimate Partner Violence: Not At Risk (02/03/2024)   Humiliation, Afraid, Rape, and Kick questionnaire    Fear of Current or Ex-Partner: No    Emotionally Abused: No    Physically Abused: No    Sexually Abused: No     Allergies  Allergen Reactions   Tramadol Palpitations    Can't take Ultram ER - palpitations  Regular Tramadol is fine to take Can't take Ultram ER - palpitations  Regular Tramadol is fine to take    Amoxicillin  Diarrhea  Compazine [Prochlorperazine] Other (See Comments)    Excitability   Compazine [Prochlorperazine] Anxiety     CBC    Component  Value Date/Time   WBC 18.5 (H) 02/05/2024 0415   RBC 3.37 (L) 02/05/2024 0415   HGB 10.7 (L) 02/05/2024 0415   HGB 12.7 05/30/2021 1145   HCT 33.6 (L) 02/05/2024 0415   HCT 36.8 05/30/2021 1145   PLT 389 02/05/2024 0415   PLT 479 (H) 05/30/2021 1145   MCV 99.7 02/05/2024 0415   MCV 95 05/30/2021 1145   MCH 31.8 02/05/2024 0415   MCHC 31.8 02/05/2024 0415   RDW 14.8 02/05/2024 0415   RDW 12.6 05/30/2021 1145   LYMPHSABS 3.8 02/05/2024 0415   MONOABS 1.2 (H) 02/05/2024 0415   EOSABS 0.1 02/05/2024 0415   BASOSABS 0.0 02/05/2024 0415    Pulmonary Functions Testing Results:    Latest Ref Rng & Units 01/27/2024    1:43 PM  PFT Results  FVC-Pre L 2.02   FVC-Predicted Pre % 56   FVC-Post L 2.18   FVC-Predicted Post % 61   Pre FEV1/FVC % % 65   Post FEV1/FCV % % 63   FEV1-Pre L 1.31   FEV1-Predicted Pre % 46   FEV1-Post L 1.38     Outpatient Medications Prior to Visit  Medication Sig Dispense Refill   acyclovir (ZOVIRAX) 400 MG tablet Take 400 mg by mouth 2 (two) times daily as needed (Flair up).     albuterol  (VENTOLIN  HFA) 108 (90 Base) MCG/ACT inhaler Inhale 2 puffs into the lungs every 6 (six) hours as needed for wheezing or shortness of breath.     ALPRAZolam  (XANAX ) 0.5 MG tablet Take 0.5 mg by mouth 3 (three) times daily as needed for anxiety.     amitriptyline  (ELAVIL ) 150 MG tablet Take 300 mg by mouth at bedtime.     atorvastatin  (LIPITOR) 20 MG tablet Take 1 tablet (20 mg total) by mouth daily. 90 tablet 3   clopidogrel  (PLAVIX ) 75 MG tablet Take 1 tablet (75 mg total) by mouth daily. 90 tablet 3   docusate sodium (COLACE) 100 MG capsule Take 100 mg by mouth daily.     gabapentin  (NEURONTIN ) 600 MG tablet Take 900 mg by mouth QID.     ipratropium-albuterol  (DUONEB) 0.5-2.5 (3) MG/3ML SOLN Take 3 mLs by nebulization every 6 (six) hours as needed. 360 mL 2   LATUDA  60 MG TABS Take 60 mg by mouth at bedtime.     montelukast  (SINGULAIR ) 10 MG tablet Take 1 tablet (10 mg  total) by mouth at bedtime. 30 tablet 12   nicotine  polacrilex (NICOTINE  MINI) 2 MG lozenge Take 1 lozenge (2 mg total) by mouth every 2 (two) hours as needed for smoking cessation. 72 lozenge 3   oxyCODONE -acetaminophen  (PERCOCET) 10-325 MG tablet Take 1 tablet by mouth every 6 (six) hours as needed.     oxymorphone (OPANA) 10 MG tablet Take 10 mg by mouth in the morning and at bedtime.     pantoprazole  (PROTONIX ) 40 MG tablet Take 40 mg by mouth daily.     QUEtiapine  (SEROQUEL ) 100 MG tablet Take 100 mg by mouth at bedtime.     zolpidem (AMBIEN) 10 MG tablet Take 10 mg by mouth daily.     budesonide -glycopyrrolate-formoterol  (BREZTRI AEROSPHERE) 160-9-4.8 MCG/ACT AERO inhaler Inhale 2 puffs into the lungs in the morning and at bedtime. 10.7 g 11   No facility-administered medications prior to visit.

## 2024-02-17 NOTE — Patient Instructions (Signed)
 The [place]  Quitline: Call 1-800-QUIT-NOW (682 840 6469). The [place] Quitline is a free service for [place]  residents. Trained counselors are available from 8 am until 3 am, 365 days per year. Services are available in both [place] and [place].   Web Resources Free online support programs can help you track your progress and share experiences with others who are quitting. These are examples: www.becomeanex.org www.trytostop.org  www.smokefree.gov  www.https://www.vargas.com/.aspx  [facility]: offers comprehensive in-person tobacco treatment counseling at [facility] building ([address], [place] [address]).  Open to everyone. Virtual appointments available. Free parking. Call [phone] to schedule an appointment or [phone] for general information.    Tobacco Cessation Medications  Nicotine  Replacement Therapy (NRT)  Nicotine  is the addictive part of tobacco smoke, but not the most dangerous part. There are 7000 other toxins in cigarettes, including carbon monoxide, that cause disease. People do not generally become addicted to medication. Common problems: People don't use enough medication or stop too early. Medications are safe and effective. Overdose is very uncommon. Use medications as long as needed (3 months minimum). Some combinations work better than single medications. Long acting medications like the NRT patch and bupropion provide continuous treatment for withdrawal symptoms.  PLUS  Short acting medications like the NRT gum, lozenge, inhaler, and nasal spray help people to cope with breakthrough cravings.  ? Nicotine  Patch  Place patch on hairless skin on upper body, including arms and back. Each day: discard old patch, shower, apply new patch to a different site. Apply hydrocortisone cream to mildly red/irritated areas. Call provider if rash develops. If patch causes sleep disturbance, remove patch  at bedtime and replace each morning after shower. Side effects may include: skin irritation, headache, insomnia, abnormal/vivid dreams.  ? Nicotine  Gum  Chew gum slowly, park in cheek when peppery taste or tingling sensation begins (about 15-30 chews). When taste or tingling goes away, begin chewing again. Use until nicotine  is gone (taste or tingle does not return, usually 30 minutes). Park in different areas of mouth. Nicotine  is absorbed through the lining of the mouth. Use enough to control cravings, up to 24 pieces per day (if used alone). Avoid eating or drinking for 15 minutes before using and during use. Side effects may include: mouth/jaw soreness, hiccups, indigestion, hypersalivation.  If gum is not chewed correctly, additional side effects may include lightheadedness, nausea/vomiting, throat and mouth irritation.  ? Nicotine  Lozenge  Allow to dissolve slowly in mouth (20-30 minutes). Do not chew or swallow. Nicotine  release may cause a warm tingling sensation. Occasionally rotate to different areas of the mouth. Use enough to control cravings, up to 20 lozenges per day (if used alone). Avoid eating or drinking for 15 minutes before using and during use. Side effects may include: nausea, hiccups, cough, heartburn, headache, gas, insomnia.  ? Nicotine  Nasal Spray Use 1 spray in each nostril (1 dose) and tilt head back for 1 minute. Do not sniff, swallow, or inhale through nose.  Use at least 8 doses (1 spray in each nostril) , up to 40 doses per day (if used alone). To reduce nasal irritation, spray on cotton swab and insert into nose. Side effects may include: nasal and/or throat irritation (hot, peppery, or burning sensation), nasal irritation, tearing, sneezing, cough, headache.  ? Nicotine  Oral Inhaler (puffer) Inhale into the back of the throat or puff in short breaths. Do not inhale into the lungs.  Puff continuously for 20 minutes (about 80 puffs) until cartridge  is  empty. Change cartridge when it loses the "burning in throat" sensation (feels like air only). Open cartridges can be saved and used again within 24 hours. Use at least 6 and up to 16 cartridges per day (if used alone).  Avoid eating or drinking for 15 minutes before using and during use. Side effects may include: mouth and/or throat irritation, unpleasant taste, cough, nasal irritation, indigestion, hiccups, headache.  ? Chantix  (varenicline ) Days 1-3: Take one 0.5 mg white pill each morning for 3 days, one week before quit date. Days 4-7: Increase to one 0.5 mg white pill twice a day in morning and evening for 4 days.  On Day 8 (target quit date), increase to one 1 mg blue pill twice a day. Maintain this dose for a minimum of 3 months. Take with food and a full glass of water to reduce nausea. Be sure that the two doses are at least 8 hours apart, but try to take second dose early in the evening (i.e. 6 pm) to avoid sleep problems. Common side effects include: nausea, insomnia, headache, abnormal/vivid dreams. Tell your doctor if you have any history of psychiatric illness prior to starting Chantix .  STOP taking CHANTIX  and contact a healthcare provider immediately if you experience agitation, hostility, depressed mood, changes in thoughts or behavior that are not typical for you, thinking about or attempting suicide, allergic or skin reactions including swelling, rash, redness, or peeling of the skin.  For patients who have heart disease: Smoking is a major risk factor for cardiovascular disease, and Chantix  can help you quit smoking. Chantix  may be associated with a small, increased risk of certain heart events in patients who have heart disease. If you have any new or worsening symptoms of heart disease while taking Chantix , such as shortness of breath or trouble breathing, new or worsening chest pain, or new or worsening pain in your legs when walking, call your doctor or get emergency medical  help immediately.  ? Wellbutrin / Zyban (bupropion) Take one 150 mg pill each morning for 3 days, one week before target quit date. On Day 4, increase to one 150 mg pill twice a day, morning and evening.  Maintain this dose for a minimum of 3 months. Be sure that the two doses are at least 8 hours apart, but try to take second dose early in the evening (i.e. 6 pm) to avoid sleep problems. Avoid or minimize use of alcohol when taking this medication. Common side effects include: dry mouth, headache, insomnia, nausea, weight loss.  Risk of seizure is 04/998. STOP taking BUPROPION and contact a healthcare provider immediately if you experience agitation, hostility, depressed mood, changes in thoughts or behavior that are not typical for you, thinking about or attempting suicide, allergic or skin reactions including swelling, rash, redness, or peeling of the skin.

## 2024-02-18 LAB — ACID FAST CULTURE WITH REFLEXED SENSITIVITIES (MYCOBACTERIA): Acid Fast Culture: NEGATIVE

## 2024-02-28 ENCOUNTER — Observation Stay

## 2024-02-28 ENCOUNTER — Observation Stay
Admission: EM | Admit: 2024-02-28 | Discharge: 2024-02-29 | Disposition: A | Discharge status: Home / Self Care | Attending: Internal Medicine | Admitting: Internal Medicine

## 2024-02-28 ENCOUNTER — Emergency Department

## 2024-02-28 ENCOUNTER — Other Ambulatory Visit: Payer: Self-pay

## 2024-02-28 DIAGNOSIS — J9611 Chronic respiratory failure with hypoxia: Secondary | ICD-10-CM | POA: Diagnosis not present

## 2024-02-28 DIAGNOSIS — Z8673 Personal history of transient ischemic attack (TIA), and cerebral infarction without residual deficits: Secondary | ICD-10-CM | POA: Diagnosis not present

## 2024-02-28 DIAGNOSIS — Z85118 Personal history of other malignant neoplasm of bronchus and lung: Secondary | ICD-10-CM | POA: Insufficient documentation

## 2024-02-28 DIAGNOSIS — R569 Unspecified convulsions: Secondary | ICD-10-CM | POA: Diagnosis not present

## 2024-02-28 DIAGNOSIS — W19XXXA Unspecified fall, initial encounter: Secondary | ICD-10-CM | POA: Insufficient documentation

## 2024-02-28 DIAGNOSIS — S0031XA Abrasion of nose, initial encounter: Secondary | ICD-10-CM | POA: Diagnosis not present

## 2024-02-28 DIAGNOSIS — G8929 Other chronic pain: Secondary | ICD-10-CM | POA: Diagnosis not present

## 2024-02-28 DIAGNOSIS — R404 Transient alteration of awareness: Secondary | ICD-10-CM | POA: Diagnosis not present

## 2024-02-28 DIAGNOSIS — J449 Chronic obstructive pulmonary disease, unspecified: Secondary | ICD-10-CM | POA: Diagnosis not present

## 2024-02-28 DIAGNOSIS — I639 Cerebral infarction, unspecified: Secondary | ICD-10-CM | POA: Diagnosis not present

## 2024-02-28 DIAGNOSIS — R200 Anesthesia of skin: Secondary | ICD-10-CM | POA: Diagnosis not present

## 2024-02-28 DIAGNOSIS — Z87891 Personal history of nicotine dependence: Secondary | ICD-10-CM | POA: Diagnosis not present

## 2024-02-28 DIAGNOSIS — I499 Cardiac arrhythmia, unspecified: Secondary | ICD-10-CM | POA: Diagnosis not present

## 2024-02-28 DIAGNOSIS — Z79899 Other long term (current) drug therapy: Secondary | ICD-10-CM | POA: Diagnosis not present

## 2024-02-28 DIAGNOSIS — R202 Paresthesia of skin: Secondary | ICD-10-CM | POA: Insufficient documentation

## 2024-02-28 DIAGNOSIS — G459 Transient cerebral ischemic attack, unspecified: Secondary | ICD-10-CM

## 2024-02-28 DIAGNOSIS — R42 Dizziness and giddiness: Secondary | ICD-10-CM | POA: Diagnosis not present

## 2024-02-28 DIAGNOSIS — Z7902 Long term (current) use of antithrombotics/antiplatelets: Secondary | ICD-10-CM | POA: Insufficient documentation

## 2024-02-28 DIAGNOSIS — Z7982 Long term (current) use of aspirin: Secondary | ICD-10-CM | POA: Insufficient documentation

## 2024-02-28 DIAGNOSIS — Z7901 Long term (current) use of anticoagulants: Secondary | ICD-10-CM | POA: Insufficient documentation

## 2024-02-28 DIAGNOSIS — R4182 Altered mental status, unspecified: Secondary | ICD-10-CM | POA: Diagnosis present

## 2024-02-28 LAB — COMPREHENSIVE METABOLIC PANEL WITH GFR
ALT: 20 U/L (ref 0–44)
AST: 60 U/L — ABNORMAL HIGH (ref 15–41)
Albumin: 3.3 g/dL — ABNORMAL LOW (ref 3.5–5.0)
Alkaline Phosphatase: 71 U/L (ref 38–126)
Anion gap: 14 (ref 5–15)
BUN: 14 mg/dL (ref 6–20)
CO2: 24 mmol/L (ref 22–32)
Calcium: 9.1 mg/dL (ref 8.9–10.3)
Chloride: 99 mmol/L (ref 98–111)
Creatinine, Ser: 0.75 mg/dL (ref 0.44–1.00)
GFR, Estimated: >60 mL/min (ref >60)
Glucose, Bld: 136 mg/dL — ABNORMAL HIGH (ref 70–99)
Potassium: 4.1 mmol/L (ref 3.5–5.1)
Sodium: 137 mmol/L (ref 135–145)
Total Bilirubin: 0.3 mg/dL (ref 0.0–1.2)
Total Protein: 8 g/dL (ref 6.5–8.1)

## 2024-02-28 LAB — CBC
HCT: 39.3 % (ref 36.0–46.0)
HCT: 34.4 % — ABNORMAL LOW (ref 36.0–46.0)
Hemoglobin: 12.5 g/dL (ref 12.0–15.0)
Hemoglobin: 11.2 g/dL — ABNORMAL LOW (ref 12.0–15.0)
MCH: 32 pg (ref 26.0–34.0)
MCH: 32 pg (ref 26.0–34.0)
MCHC: 31.8 g/dL (ref 30.0–36.0)
MCHC: 32.6 g/dL (ref 30.0–36.0)
MCV: 100.5 fL — ABNORMAL HIGH (ref 80.0–100.0)
MCV: 98.3 fL (ref 80.0–100.0)
Platelets: 330 K/uL (ref 150–400)
Platelets: 287 K/uL (ref 150–400)
RBC: 3.91 MIL/uL (ref 3.87–5.11)
RBC: 3.5 MIL/uL — ABNORMAL LOW (ref 3.87–5.11)
RDW: 15.1 % (ref 11.5–15.5)
RDW: 15.1 % (ref 11.5–15.5)
WBC: 12.6 K/uL — ABNORMAL HIGH (ref 4.0–10.5)
WBC: 12.4 K/uL — ABNORMAL HIGH (ref 4.0–10.5)
nRBC: 0 % (ref 0.0–0.2)
nRBC: 0 % (ref 0.0–0.2)

## 2024-02-28 LAB — DIFFERENTIAL
Abs Immature Granulocytes: 0.05 K/uL (ref 0.00–0.07)
Basophils Absolute: 0 K/uL (ref 0.0–0.1)
Basophils Relative: 0 %
Eosinophils Absolute: 0.1 K/uL (ref 0.0–0.5)
Eosinophils Relative: 1 %
Immature Granulocytes: 0 %
Lymphocytes Relative: 14 %
Lymphs Abs: 1.8 K/uL (ref 0.7–4.0)
Monocytes Absolute: 1.1 K/uL — ABNORMAL HIGH (ref 0.1–1.0)
Monocytes Relative: 9 %
Neutro Abs: 9.6 K/uL — ABNORMAL HIGH (ref 1.7–7.7)
Neutrophils Relative %: 76 %

## 2024-02-28 LAB — BLOOD GAS, VENOUS
Acid-Base Excess: 5.5 mmol/L — ABNORMAL HIGH (ref 0.0–2.0)
Bicarbonate: 32.4 mmol/L — ABNORMAL HIGH (ref 20.0–28.0)
O2 Saturation: 51.7 %
Patient temperature: 37
pCO2, Ven: 56 mmHg (ref 44–60)
pH, Ven: 7.37 (ref 7.25–7.43)
pO2, Ven: 32 mmHg (ref 32–45)

## 2024-02-28 LAB — PROTIME-INR
INR: 1.1 (ref 0.8–1.2)
Prothrombin Time: 14.4 s (ref 11.4–15.2)

## 2024-02-28 LAB — APTT: aPTT: 27 s (ref 24–36)

## 2024-02-28 LAB — CREATININE, SERUM
Creatinine, Ser: 0.78 mg/dL (ref 0.44–1.00)
GFR, Estimated: >60 mL/min (ref >60)

## 2024-02-28 LAB — MAGNESIUM: Magnesium: 1.9 mg/dL (ref 1.7–2.4)

## 2024-02-28 LAB — TROPONIN I (HIGH SENSITIVITY): Troponin I (High Sensitivity): 6 ng/L (ref <18)

## 2024-02-28 LAB — ETHANOL: Alcohol, Ethyl (B): <15 mg/dL (ref <15)

## 2024-02-28 MED ORDER — SODIUM CHLORIDE 0.9 % IV SOLN
INTRAVENOUS | Status: DC
Start: 2024-02-28 — End: 2024-02-28

## 2024-02-28 MED ORDER — GABAPENTIN 300 MG PO CAPS
900.0000 mg | ORAL_CAPSULE | Freq: Four times a day (QID) | ORAL | Status: DC
  Administered 2024-02-28 – 2024-02-29 (×2): 900 mg via ORAL
  Filled 2024-02-28 (×3): qty 3

## 2024-02-28 MED ORDER — ALBUTEROL SULFATE (2.5 MG/3ML) 0.083% IN NEBU: 2.5000 mg | INHALATION_SOLUTION | Freq: Four times a day (QID) | RESPIRATORY_TRACT | Status: DC | PRN

## 2024-02-28 MED ORDER — IOHEXOL 350 MG/ML SOLN
75.0000 mL | Freq: Once | INTRAVENOUS | Status: AC | PRN
  Administered 2024-02-28: 75 mL via INTRAVENOUS

## 2024-02-28 MED ORDER — ALPRAZOLAM 0.5 MG PO TABS
0.5000 mg | ORAL_TABLET | Freq: Three times a day (TID) | ORAL | Status: DC | PRN
  Administered 2024-02-28: 0.5 mg via ORAL
  Filled 2024-02-28: qty 1

## 2024-02-28 MED ORDER — ACETAMINOPHEN 650 MG RE SUPP: 650.0000 mg | RECTAL | Status: DC | PRN

## 2024-02-28 MED ORDER — PANTOPRAZOLE SODIUM 40 MG PO TBEC
40.0000 mg | DELAYED_RELEASE_TABLET | Freq: Every day | ORAL | Status: DC
  Administered 2024-02-28 – 2024-02-29 (×2): 40 mg via ORAL
  Filled 2024-02-28 (×2): qty 1

## 2024-02-28 MED ORDER — QUETIAPINE FUMARATE 25 MG PO TABS
100.0000 mg | ORAL_TABLET | Freq: Every day | ORAL | Status: DC
  Administered 2024-02-28: 100 mg via ORAL
  Filled 2024-02-28: qty 4

## 2024-02-28 MED ORDER — LURASIDONE HCL 20 MG PO TABS
60.0000 mg | ORAL_TABLET | Freq: Every day | ORAL | Status: DC
  Administered 2024-02-28: 60 mg via ORAL
  Filled 2024-02-28 (×2): qty 3

## 2024-02-28 MED ORDER — ACETAMINOPHEN 325 MG PO TABS
650.0000 mg | ORAL_TABLET | ORAL | Status: DC | PRN
  Filled 2024-02-28: qty 2

## 2024-02-28 MED ORDER — MONTELUKAST SODIUM 10 MG PO TABS
10.0000 mg | ORAL_TABLET | Freq: Every day | ORAL | Status: DC
  Administered 2024-02-28: 10 mg via ORAL
  Filled 2024-02-28: qty 1

## 2024-02-28 MED ORDER — AMITRIPTYLINE HCL 50 MG PO TABS
300.0000 mg | ORAL_TABLET | Freq: Every day | ORAL | Status: DC
  Administered 2024-02-28: 300 mg via ORAL
  Filled 2024-02-28: qty 3
  Filled 2024-02-28: qty 12

## 2024-02-28 MED ORDER — ACETAMINOPHEN 160 MG/5ML PO SOLN: 650.0000 mg | ORAL | Status: DC | PRN

## 2024-02-28 MED ORDER — STROKE: EARLY STAGES OF RECOVERY BOOK: Freq: Once | Status: DC

## 2024-02-28 MED ORDER — ATORVASTATIN CALCIUM 20 MG PO TABS
20.0000 mg | ORAL_TABLET | Freq: Every day | ORAL | Status: DC
  Administered 2024-02-28 – 2024-02-29 (×2): 20 mg via ORAL
  Filled 2024-02-28 (×2): qty 1

## 2024-02-28 MED ORDER — ASPIRIN 325 MG PO TABS
325.0000 mg | ORAL_TABLET | Freq: Every day | ORAL | Status: DC
  Administered 2024-02-28 – 2024-02-29 (×2): 325 mg via ORAL
  Filled 2024-02-28 (×2): qty 1

## 2024-02-28 MED ORDER — CLOPIDOGREL BISULFATE 75 MG PO TABS
75.0000 mg | ORAL_TABLET | Freq: Every day | ORAL | Status: DC
  Administered 2024-02-28 – 2024-02-29 (×2): 75 mg via ORAL
  Filled 2024-02-28 (×2): qty 1

## 2024-02-28 MED ORDER — OXYCODONE HCL 5 MG PO TABS
10.0000 mg | ORAL_TABLET | Freq: Four times a day (QID) | ORAL | Status: DC | PRN
  Administered 2024-02-28 – 2024-02-29 (×3): 10 mg via ORAL
  Filled 2024-02-28 (×3): qty 2

## 2024-02-28 MED ORDER — ENOXAPARIN SODIUM 40 MG/0.4ML IJ SOSY
40.0000 mg | PREFILLED_SYRINGE | INTRAMUSCULAR | Status: DC
  Administered 2024-02-28: 40 mg via SUBCUTANEOUS
  Filled 2024-02-28: qty 0.4

## 2024-02-28 MED ORDER — ASPIRIN 300 MG RE SUPP: 300.0000 mg | Freq: Every day | RECTAL | Status: DC

## 2024-02-28 MED ORDER — OXYCODONE-ACETAMINOPHEN 10-325 MG PO TABS: 1.0000 | ORAL_TABLET | Freq: Four times a day (QID) | ORAL | Status: DC | PRN

## 2024-02-28 MED ORDER — ACETAMINOPHEN 325 MG PO TABS
325.0000 mg | ORAL_TABLET | Freq: Four times a day (QID) | ORAL | Status: DC | PRN
Start: 2024-02-28 — End: 2024-02-29
  Administered 2024-02-29 (×2): 325 mg via ORAL
  Filled 2024-02-28: qty 1

## 2024-02-28 NOTE — ED Triage Notes (Signed)
 Pt presents to the ED via POV from home with AMS since yesterday morning per mother. Pt has a hx of strokes with no deficits. Pt fell out of bed this morning around 3am and mother states that she had another altered episode and would not respond to any of her commands for about 15 minutes. Pt not altered a this time. Reports hitting her nose on the nightstand. Pt takes plavix . Pt reports tingling left hand that she woke up with this morning. Denies weakness.   Pt has a hx of COPD and wears 2L O2 via [place] at [facility].

## 2024-02-28 NOTE — ED Notes (Signed)
 Advised nurse that patient has ready bed

## 2024-02-28 NOTE — H&P (Signed)
 History and Physical    Patient: Courtney Mayer FMW:[id number] DOB: Nov 13, 1970 DOA: 02/28/2024 DOS: the patient was seen and examined on 02/28/2024 PCP: Lauretta Senior, MD  Patient coming from: Home  Chief Complaint:  Chief Complaint  Patient presents with   Altered Mental Status   HPI: Courtney Mayer is a 53 y.o. female with medical history significant of chronic back pain on narcotics, chronic COPD on 3 L nasal cannula oxygen, history of CVA, lung cancer comes to the emergency room with altered mental status. Per history patient had a big dump fell out of bed hitting her face. She has abrasion on her dose. Mother went to check on her and patient was found confused about 15 minutes. No tongue bite or urinary incontinence. She woke up this morning started having numbness in her left upper extremity. Denies any dysarthria or any weakness in other extremity. She has history of brain aneurysm without rupture.  In the emergency room CT scan of the head was done negative for acute stroke. She was seen by neurology and patient is getting workup for rule out stroke and EEG. History of seizures in the past. Patient continues to smoke on a daily basis. Denies any chest pain or shortness of breath.  Review of Systems: As mentioned in the history of present illness. All other systems reviewed and are negative. 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   Cerebrovascular accident (CVA) due to occlusion of left middle cerebral artery (HCC)    COPD (chronic obstructive pulmonary disease) (HCC)    Pneumonia of right middle lobe due to infectious organism 12/17/2023   Past Surgical History:  Procedure Laterality Date   atrial valve replacement     BREAST SURGERY Right    cyst removal   BUBBLE STUDY  06/19/2021    Procedure: BUBBLE STUDY;  Surgeon: Alveta Aleene PARAS, MD;  Location: [facility] ENDOSCOPY;  Service: Cardiovascular;;   CESAREAN SECTION     FLEXIBLE BRONCHOSCOPY Bilateral 01/05/2024   Procedure: ELLIOTT SIDE;  Surgeon: Isadora Hose, MD;  Location: [facility] ORS;  Service: Pulmonary;  Laterality: Bilateral;   LOBECTOMY     LUNG SURGERY Left    TEE WITHOUT CARDIOVERSION N/A 06/19/2021   Procedure: TRANSESOPHAGEAL ECHOCARDIOGRAM (TEE);  Surgeon: Alveta Aleene PARAS, MD;  Location: [facility] ENDOSCOPY;  Service: Cardiovascular;  Laterality: N/A;   WRIST FRACTURE SURGERY     Social History:  reports that she has been smoking cigarettes. She started smoking about 39 years ago. She has a 79.7 pack-year smoking history. She has never used smokeless tobacco. She reports that she does not currently use alcohol. She reports current drug use. Drug: Marijuana.  Allergies  Allergen Reactions   Tramadol Palpitations    Can't take Ultram ER - palpitations  Regular Tramadol is fine to take Can't take Ultram ER - palpitations  Regular Tramadol is fine to take    Amoxicillin  Diarrhea   Compazine [Prochlorperazine] Other (See Comments)    Excitability   Compazine [Prochlorperazine] Anxiety    Family History  Problem Relation Age of Onset   Healthy Mother    Healthy Father     Prior to Admission medications   Medication Sig Start Date End Date Taking? Authorizing Provider  metoCLOPramide  (REGLAN ) 10 MG tablet Take 10 mg by mouth every 8 (eight)  hours as needed. 02/21/24  Yes [provider]  nicotine  (NICODERM CQ  - DOSED IN MG/24 HOURS) 21 mg/24hr patch Place 21 mg onto the skin daily. 02/21/24  Yes [provider]  acyclovir (ZOVIRAX) 400 MG tablet Take 400 mg by mouth 2 (two) times daily as needed (Flair up).    [provider]  albuterol  (VENTOLIN  HFA) 108 (90 Base) MCG/ACT inhaler Inhale 2 puffs into the lungs every 6 (six) hours as needed for wheezing or shortness of breath.     [provider]  ALPRAZolam  (XANAX ) 0.5 MG tablet Take 0.5 mg by mouth 3 (three) times daily as needed for anxiety.    [provider]  amitriptyline  (ELAVIL ) 150 MG tablet Take 300 mg by mouth at bedtime. 12/31/20   [provider]  atorvastatin  (LIPITOR) 20 MG tablet Take 1 tablet (20 mg total) by mouth daily. 05/26/23   Onita Duos, MD  budesonide -glycopyrrolate-formoterol  (BREZTRI AEROSPHERE) 160-9-4.8 MCG/ACT AERO inhaler Inhale 2 puffs into the lungs in the morning and at bedtime. 02/17/24   Isadora Hose, MD  clopidogrel  (PLAVIX ) 75 MG tablet Take 1 tablet (75 mg total) by mouth daily. 06/26/23   Onita Duos, MD  docusate sodium (COLACE) 100 MG capsule Take 100 mg by mouth daily.    [provider]  gabapentin  (NEURONTIN ) 600 MG tablet Take 900 mg by mouth QID. 01/12/21   [provider]  ipratropium-albuterol  (DUONEB) 0.5-2.5 (3) MG/3ML SOLN Take 3 mLs by nebulization every 6 (six) hours as needed. 01/27/24   Isadora Hose, MD  LATUDA  60 MG TABS Take 60 mg by mouth at bedtime. 01/03/21   [provider]  montelukast  (SINGULAIR ) 10 MG tablet Take 1 tablet (10 mg total) by mouth at bedtime. 12/29/23   Isadora Hose, MD  nicotine  polacrilex (NICOTINE  MINI) 2 MG lozenge Take 1 lozenge (2 mg total) by mouth every 2 (two) hours as needed for smoking cessation. 12/29/23 03/28/24  Isadora Hose, MD  oxyCODONE -acetaminophen  (PERCOCET) 10-325 MG tablet Take 1 tablet by mouth every 6 (six) hours as needed. 08/06/15   [provider]  oxymorphone (OPANA) 10 MG tablet Take 10 mg by mouth in the morning and at bedtime.    [provider]  pantoprazole  (PROTONIX ) 40 MG tablet Take 40 mg by mouth daily. 01/14/21   [provider]  QUEtiapine  (SEROQUEL ) 100 MG tablet Take 100 mg by mouth at bedtime. 01/19/24   [provider]  zolpidem (AMBIEN) 10 MG tablet Take 10 mg by mouth daily.    [provider]    Physical  Exam: Vitals:   02/28/24 1134 02/28/24 1434 02/28/24 1435 02/28/24 1500  BP:  115/77  107/75  Pulse:  84 84 86  Resp:  18  18  Temp:      SpO2:  96% 96% 98%  Weight: 53.1 kg     Height: 5' 5 (1.651 m)     Physical Exam Constitutional:      Appearance: Normal appearance.  HENT:     Head: Normocephalic.     Nose: Nose normal.  Eyes:     Extraocular Movements: Extraocular movements intact.     Pupils: Pupils are equal, round, and reactive to light.  Cardiovascular:     Rate and Rhythm: Normal rate and regular rhythm.     Pulses: Normal pulses.     Heart sounds: Normal heart sounds.  Pulmonary:     Effort: Pulmonary effort is normal.     Breath sounds: Normal  breath sounds.  Musculoskeletal:     Cervical back: Normal range of motion.  Neurological:     Mental Status: She is alert.     Motor: Weakness present.      Assessment and Plan: Lasonja Lakins is a 53 y.o. female with medical history significant of chronic back pain on narcotics, chronic COPD on 3 L nasal cannula oxygen, history of CVA, lung cancer comes to the emergency room with altered mental status.  Altered mental status and left upper extremity weakness rule out stroke, seizure -- admit to MedSurg with tele --aspirin  plus Plavix  -- statins -- patient seen by neurology Dr. Germaine. Recommends EEG MRI brain, CT head and neck Angiuo -- PT OT and speech therapy to see  Chronic respiratory failure with chronic COPD -- continue home oxygen ,PRN nebs  Ongoing tobacco abuse -- advised cessation  Chronic narcotic use -- will confirm dose of narcotic and resume it     Advance Care Planning:   Code Status: Full Code   Consults: neurology  Family Communication: no family in ER  Severity of Illness: The appropriate patient status for this patient is OBSERVATION. Observation status is judged to be reasonable and necessary in order to provide the required intensity of service to ensure the patient's safety.  The patient's presenting symptoms, physical exam findings, and initial radiographic and laboratory data in the context of their medical condition is felt to place them at decreased risk for further clinical deterioration. Furthermore, it is anticipated that the patient will be medically stable for discharge from the hospital within 2 midnights of admission.   Author: Leita Blanch, MD 02/28/2024 3:40 PM  For on call review [email or web address].

## 2024-02-28 NOTE — ED Provider Notes (Signed)
 [facility] Provider Note    Event Date/Time   First MD Initiated Contact with Patient 02/28/24 1328     (approximate)   History   Altered Mental Status   HPI  Courtney Mayer is a 52 y.o. 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  History is provided by the patient's mother who is at bedside.  Last night patient had a big thump and fell out of bed hitting her face.  When her mother walked into the room states that she was altered, confused, did not know where she was or what was happening for approximately 15 minutes.  No tongue biting or urinary incontinence.  States that she went to bed and woke up this morning and started having numbness and tingling to her left forearm and hand.  Denies any change in speech or trouble swallowing.  No weakness.  Multiple episodes over the past couple of weeks where she will have altered mental status sometimes will last only a couple of minutes and then resolves.  History of prior CVA.  Prior diagnosis of brain aneurysm without rupture.  No significant residual deficits from her stroke.  No new medications.  On chart review recent hospitalization and was discharged on 02/05/2024 -found to have acute hypoxic respiratory failure with findings concerning for pneumonia.  Had a COPD exacerbation at that time     Physical Exam   Triage Vital Signs: ED Triage Vitals  Encounter Vitals Group     BP 02/28/24 1133 106/76     Girls Systolic BP Percentile --      Girls Diastolic BP Percentile --      Boys Systolic BP Percentile --      Boys Diastolic BP Percentile --      Pulse Rate 02/28/24 1133 97     Resp 02/28/24 1133 18     Temp 02/28/24 1133 99 F (37.2 C)     Temp src --      SpO2 02/28/24 1133 96 %      Weight 02/28/24 1134 117 lb (53.1 kg)     Height 02/28/24 1134 5' 5 (1.651 m)     Head Circumference --      Peak Flow --      Pain Score 02/28/24 1134 0     Pain Loc --      Pain Education --      Exclude from Growth Chart --     Most recent vital signs: Vitals:   02/28/24 1435 02/28/24 1500  BP:  107/75  Pulse: 84 86  Resp:    Temp:    SpO2: 96% 98%    Physical Exam Constitutional:      Appearance: She is well-developed.  HENT:     Head:     Comments: Abrasion with soft tissue swelling to the nasal bridge.  No significant tenderness to palpation. Eyes:     Extraocular Movements: Extraocular movements intact.     Conjunctiva/sclera: Conjunctivae normal.     Pupils: Pupils are equal, round, and reactive to light.  Cardiovascular:     Rate and Rhythm: Regular rhythm.  Pulmonary:     Effort: No respiratory distress.  Abdominal:     General: There is no distension.  Musculoskeletal:        General: Normal  range of motion.     Cervical back: Normal range of motion.  Skin:    General: Skin is warm.  Neurological:     Mental Status: She is alert. Mental status is at baseline.     GCS: GCS eye subscore is 4. GCS verbal subscore is 5. GCS motor subscore is 6.     Cranial Nerves: Cranial nerves 2-12 are intact.     Sensory: Sensory deficit present.     Motor: Motor function is intact.     Coordination: Coordination is intact.     Comments: Sensation is intact but decreased to the left hemibody     IMPRESSION / MDM / ASSESSMENT AND PLAN / ED COURSE  I reviewed the triage vital signs and the nursing notes.  Differential diagnosis including electrolyte abnormality, intracranial hemorrhage, CVA, seizure, hypercarbia, ACS, dysrhythmia   EKG  I, Clotilda Punter, the attending physician, personally viewed and interpreted this ECG.  EKG with significant artifact.  Normal sinus rhythm.  Nonspecific ST changes.  Questionable findings of atrial enlargement.  No significant ST  elevation or depression.  No tachycardic or bradycardic dysrhythmias while on cardiac telemetry.  RADIOLOGY CT scan of the head with no acute findings.  Prior CVA   LABS (all labs ordered are listed, but only abnormal results are displayed) Labs interpreted as -    Labs Reviewed  CBC - Abnormal; Notable for the following components:      Result Value   WBC 12.6 (*)    MCV 100.5 (*)    All other components within normal limits  DIFFERENTIAL - Abnormal; Notable for the following components:   Neutro Abs 9.6 (*)    Monocytes Absolute 1.1 (*)    All other components within normal limits  COMPREHENSIVE METABOLIC PANEL WITH GFR - Abnormal; Notable for the following components:   Glucose, Bld 136 (*)    Albumin 3.3 (*)    AST 60 (*)    All other components within normal limits  BLOOD GAS, VENOUS - Abnormal; Notable for the following components:   Bicarbonate 32.4 (*)    Acid-Base Excess 5.5 (*)    All other components within normal limits  PROTIME-INR  APTT  ETHANOL  MAGNESIUM  TROPONIN I (HIGH SENSITIVITY)     MDM  Lab work with no significant hypercarbia and a normal pH.  Mild leukocytosis of 12.6.  No signs or symptoms consistent with an infectious process.  No significant anemia.  Creatinine at baseline.  Troponin is negative.  No chest pain or shortness of breath have a low suspicion for ACS.    Discussed the patient's case with neurology Dr. Germaine who will come and evaluate the patient in the emergency department.  Recommended admission for CVA workup and possible seizure.  Concern for possible nasal bridge fracture.  No septal hematoma.  Discussed that the patient would need outpatient follow-up with ENT.  Consulted hospitalist for admission for left hemibody numbness and episodes of altered mental status.  Back to her baseline at this time.     PROCEDURES:  Critical Care performed: No  Procedures  Patient's presentation is most consistent with acute  presentation with potential threat to life or bodily function.   MEDICATIONS ORDERED IN ED: Medications - No data to display  FINAL CLINICAL IMPRESSION(S) / ED DIAGNOSES   Final diagnoses:  Left sided numbness  Transient alteration of awareness  Abrasion of nose, initial encounter     Rx / DC Orders   ED  Discharge Orders     None        Note:  This document was prepared using Dragon voice recognition software and may include unintentional dictation errors.   Suzanne Kirsch, MD 02/28/24 [phone]

## 2024-02-28 NOTE — ED Notes (Signed)
 ED Provider at bedside.

## 2024-02-28 NOTE — Consult Note (Signed)
 Requesting Physician: Mumma    Chief Complaint: Left sided numbness  I have been asked by Dr. Suzanne to see this patient in consultation to rule out stroke.  HPI: Courtney Mayer is an 53 y.o. 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  Patient reports going to bed at baseline.  Awakened this AM and noted that she was numb on the left.  Symptoms still persist. Patient also reports that over the past week she has had episodes of presyncope and or feelings of altered mental status.  They resolve spontaneously.  Early this morning though she was noted to have fallen out of bed and was very confused after.  Patient is at baseline at this time.  There was no tongue biting or bowel/bladder incontinence.    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   Cerebrovascular accident (CVA) due to occlusion of left middle cerebral artery (HCC)    COPD (chronic obstructive pulmonary disease) (HCC)    Pneumonia of right middle lobe due to infectious organism 12/17/2023    Past Surgical History:  Procedure Laterality Date   atrial valve replacement     BREAST SURGERY Right    cyst removal   BUBBLE STUDY  06/19/2021   Procedure: BUBBLE STUDY;  Surgeon: Alveta Aleene PARAS, MD;  Location: [facility] ENDOSCOPY;  Service: Cardiovascular;;   CESAREAN SECTION     FLEXIBLE BRONCHOSCOPY Bilateral 01/05/2024   Procedure: ELLIOTT SIDE;  Surgeon: Isadora Hose,  MD;  Location: [facility] ORS;  Service: Pulmonary;  Laterality: Bilateral;   LOBECTOMY     LUNG SURGERY Left    TEE WITHOUT CARDIOVERSION N/A 06/19/2021   Procedure: TRANSESOPHAGEAL ECHOCARDIOGRAM (TEE);  Surgeon: Alveta Aleene PARAS, MD;  Location: [facility] ENDOSCOPY;  Service: Cardiovascular;  Laterality: N/A;   WRIST FRACTURE SURGERY      Family History  Problem Relation Age of Onset   Healthy Mother    Healthy Father    Social History:  reports that she has been smoking cigarettes. She started smoking about 39 years ago. She has a 79.7 pack-year smoking history. She has never used smokeless tobacco. She reports that she does not currently use alcohol. She reports current drug use. Drug: Marijuana.  Allergies:  Allergies  Allergen Reactions   Tramadol Palpitations    Can't take Ultram ER - palpitations  Regular Tramadol is fine to take Can't take Ultram ER - palpitations  Regular Tramadol is fine to take    Amoxicillin  Diarrhea   Compazine [Prochlorperazine] Other (See Comments)    Excitability   Compazine [Prochlorperazine] Anxiety    Medications:  Prior to Admission medications   Medication Sig Start Date End Date Taking? Authorizing Provider  albuterol  (VENTOLIN  HFA) 108 (90 Base) MCG/ACT inhaler Inhale 2 puffs into the lungs every 6 (six) hours as needed for wheezing or shortness of breath.   Yes [provider]  ALPRAZolam  (XANAX ) 0.5 MG tablet Take 0.5 mg by mouth 3 (three) times daily as needed  for anxiety.   Yes [provider]  amitriptyline  (ELAVIL ) 150 MG tablet Take 300 mg by mouth at bedtime. 12/31/20  Yes [provider]  atorvastatin  (LIPITOR) 20 MG tablet Take 1 tablet (20 mg total) by mouth daily. 05/26/23  Yes Onita Duos, MD  budesonide -glycopyrrolate-formoterol  (BREZTRI AEROSPHERE) 160-9-4.8 MCG/ACT AERO inhaler Inhale 2 puffs into the lungs in the morning and at bedtime. 02/17/24  Yes Dgayli, Belva, MD  clopidogrel  (PLAVIX ) 75 MG tablet Take 1  tablet (75 mg total) by mouth daily. 06/26/23  Yes Onita Duos, MD  gabapentin  (NEURONTIN ) 600 MG tablet Take 900 mg by mouth QID. 01/12/21  Yes [provider]  ipratropium-albuterol  (DUONEB) 0.5-2.5 (3) MG/3ML SOLN Take 3 mLs by nebulization every 6 (six) hours as needed. 01/27/24  Yes Dgayli, Belva, MD  LATUDA  60 MG TABS Take 60 mg by mouth at bedtime. 01/03/21  Yes [provider]  metoCLOPramide  (REGLAN ) 10 MG tablet Take 10 mg by mouth every 8 (eight) hours as needed. 02/21/24  Yes [provider]  montelukast  (SINGULAIR ) 10 MG tablet Take 1 tablet (10 mg total) by mouth at bedtime. 12/29/23  Yes Dgayli, Belva, MD  nicotine  (NICODERM CQ  - DOSED IN MG/24 HOURS) 21 mg/24hr patch Place 21 mg onto the skin daily. 02/21/24  Yes [provider]  oxyCODONE -acetaminophen  (PERCOCET) 10-325 MG tablet Take 1 tablet by mouth every 6 (six) hours as needed. 08/06/15  Yes [provider]  oxymorphone (OPANA ER) 10 MG 12 hr tablet Take 10 mg by mouth 2 (two) times daily as needed. 02/21/24  Yes [provider]  oxymorphone (OPANA) 10 MG tablet Take 10 mg by mouth 2 (two) times daily as needed for pain.   Yes [provider]  pantoprazole  (PROTONIX ) 40 MG tablet Take 40 mg by mouth daily. 01/14/21  Yes [provider]  QUEtiapine  (SEROQUEL ) 100 MG tablet Take 100 mg by mouth at bedtime. 01/19/24  Yes [provider]  zolpidem (AMBIEN) 10 MG tablet Take 10 mg by mouth daily.   Yes [provider]  acyclovir (ZOVIRAX) 400 MG tablet Take 400 mg by mouth 2 (two) times daily as needed (Flair up).    [provider]  docusate sodium (COLACE) 100 MG capsule Take 100 mg by mouth daily. Patient not taking: Reported on 02/28/2024    [provider]  nicotine  polacrilex (NICOTINE  MINI) 2 MG lozenge Take 1 lozenge (2 mg total) by mouth every 2 (two) hours as needed for smoking cessation. 12/29/23 03/28/24  Isadora Belva, MD      ROS: History obtained from the patient  General ROS: difficulty with sleep Psychological ROS: as noted in HPI Ophthalmic ROS: negative for - blurry vision, double vision, eye pain or loss of vision ENT ROS: negative for - epistaxis, nasal discharge, oral lesions, sore throat, tinnitus or vertigo Allergy and Immunology ROS: negative for - hives or itchy/watery eyes Hematological and Lymphatic ROS: negative for - bleeding problems, bruising or swollen lymph nodes Endocrine ROS: negative for - galactorrhea, hair pattern changes, polydipsia/polyuria or temperature intolerance Respiratory ROS: cough Cardiovascular ROS: negative for - chest pain, dyspnea on exertion, edema or irregular heartbeat Gastrointestinal ROS: negative for - abdominal pain, diarrhea, hematemesis, nausea/vomiting or stool incontinence Genito-Urinary ROS: negative for - dysuria, hematuria, incontinence or urinary frequency/urgency Musculoskeletal ROS: back pain Neurological ROS: as noted in HPI Dermatological ROS: negative for rash and skin lesion changes   Physical Examination: Blood pressure 107/75, pulse 86, temperature 99 F (37.2  C), resp. rate 18, height 5' 5 (1.651 m), weight 53.1 kg, SpO2 98%.  HEENT-  Normocephalic, no lesions, without obvious abnormality.  Normal external eye and conjunctiva.  Normal TM's bilaterally.  Normal auditory canals and external ears. Normal external nose, mucus membranes and septum.  Normal pharynx. Cardiovascular- Single S1, S2, pulses palpable throughout   Lungs- CTA Abdomen- soft and nontender Extremities- no edema Musculoskeletal-no joint tenderness, deformity or swelling Skin-bruising on face  Neurological Examination   Mental Status: Alert, oriented, thought content appropriate.  Some difficulty with memory.  Speech fluent without evidence of aphasia.  Able to follow 3 step commands without difficulty. Cranial Nerves: II: Visual fields grossly normal, pupils equal,  round, reactive to light and accommodation III,IV, VI: ptosis not present, extra-ocular motions intact bilaterally V,VII: smile symmetric, facial light touch sensation decreased on the left VIII: hearing normal bilaterally IX,X: gag reflex present XI: bilateral shoulder shrug XII: midline tongue extension Motor: Right : Upper extremity   5/5    Left:     Upper extremity   5/5  Lower extremity   5/5     Lower extremity   5/5 Tone and bulk:normal tone throughout; no atrophy noted Sensory: Pinprick and light touch decreased on the left upper and lower extremities Deep Tendon Reflexes: Symmetric throughout Plantars: Right: mute   Left: mute Cerebellar: normal finger-to-nose and normal heel-to-shin testing bilaterally Gait: not tested due to safety concerns     Laboratory Studies:  Basic Metabolic Panel: Recent Labs  Lab 02/28/24 1136  NA 137  K 4.1  CL 99  CO2 24  GLUCOSE 136*  BUN 14  CREATININE 0.75  CALCIUM  9.1  MG 1.9    Liver Function Tests: Recent Labs  Lab 02/28/24 1136  AST 60*  ALT 20  ALKPHOS 71  BILITOT 0.3  PROT 8.0  ALBUMIN 3.3*   No results for input(s): LIPASE, AMYLASE in the last 168 hours. No results for input(s): AMMONIA in the last 168 hours.  CBC: Recent Labs  Lab 02/28/24 1136  WBC 12.6*  NEUTROABS 9.6*  HGB 12.5  HCT 39.3  MCV 100.5*  PLT 330    Cardiac Enzymes: No results for input(s): CKTOTAL, CKMB, CKMBINDEX, TROPONINI in the last 168 hours.  BNP: Invalid input(s): POCBNP  CBG: No results for input(s): GLUCAP in the last 168 hours.  Microbiology: Results for orders placed or performed during the hospital encounter of 02/02/24  Blood Culture (routine x 2)     Status: None   Collection Time: 02/02/24  2:33 PM   Specimen: BLOOD  Result Value Ref Range Status   Specimen Description BLOOD BLOOD RIGHT FOREARM  Final   Special Requests   Final    BOTTLES DRAWN AEROBIC AND ANAEROBIC Blood Culture adequate  volume   Culture   Final    NO GROWTH 5 DAYS Performed at [facility], [address] [place], [place] [address]    Report Status 02/07/2024 FINAL  Final  Resp panel by RT-PCR (RSV, Flu A&B, Covid) Anterior Nasal Swab     Status: None   Collection Time: 02/02/24  2:33 PM   Specimen: Anterior Nasal Swab  Result Value Ref Range Status   SARS Coronavirus 2 by RT PCR NEGATIVE NEGATIVE Final    Comment: (NOTE) SARS-CoV-2 target nucleic acids are NOT DETECTED.  The SARS-CoV-2 RNA is generally detectable in upper respiratory specimens during the acute phase of infection. The lowest concentration of SARS-CoV-2 viral copies this assay can detect  is 138 copies/mL. A negative result does not preclude SARS-Cov-2 infection and should not be used as the sole basis for treatment or other patient management decisions. A negative result may occur with  improper specimen collection/handling, submission of specimen other than nasopharyngeal swab, presence of viral mutation(s) within the areas targeted by this assay, and inadequate number of viral copies(<138 copies/mL). A negative result must be combined with clinical observations, patient history, and epidemiological information. The expected result is Negative.  Fact Sheet for Patients:  bloggercourse.com  Fact Sheet for Healthcare Providers:  seriousbroker.it  This test is no t yet approved or cleared by the [place]  FDA and  has been authorized for detection and/or diagnosis of SARS-CoV-2 by FDA under an Emergency Use Authorization (EUA). This EUA will remain  in effect (meaning this test can be used) for the duration of the COVID-19 declaration under Section 564(b)(1) of the Act, 21 U.S.C.section 360bbb-3(b)(1), unless the authorization is terminated  or revoked sooner.       Influenza A by PCR NEGATIVE NEGATIVE Final   Influenza B by PCR NEGATIVE NEGATIVE Final     Comment: (NOTE) The Xpert Xpress SARS-CoV-2/FLU/RSV plus assay is intended as an aid in the diagnosis of influenza from Nasopharyngeal swab specimens and should not be used as a sole basis for treatment. Nasal washings and aspirates are unacceptable for Xpert Xpress SARS-CoV-2/FLU/RSV testing.  Fact Sheet for Patients: bloggercourse.com  Fact Sheet for Healthcare Providers: seriousbroker.it  This test is not yet approved or cleared by the [place]  FDA and has been authorized for detection and/or diagnosis of SARS-CoV-2 by FDA under an Emergency Use Authorization (EUA). This EUA will remain in effect (meaning this test can be used) for the duration of the COVID-19 declaration under Section 564(b)(1) of the Act, 21 U.S.C. section 360bbb-3(b)(1), unless the authorization is terminated or revoked.     Resp Syncytial Virus by PCR NEGATIVE NEGATIVE Final    Comment: (NOTE) Fact Sheet for Patients: bloggercourse.com  Fact Sheet for Healthcare Providers: seriousbroker.it  This test is not yet approved or cleared by the [place]  FDA and has been authorized for detection and/or diagnosis of SARS-CoV-2 by FDA under an Emergency Use Authorization (EUA). This EUA will remain in effect (meaning this test can be used) for the duration of the COVID-19 declaration under Section 564(b)(1) of the Act, 21 U.S.C. section 360bbb-3(b)(1), unless the authorization is terminated or revoked.  Performed at [facility], [address] [place], [place] [address]   Blood Culture (routine x 2)     Status: None   Collection Time: 02/02/24  2:35 PM   Specimen: BLOOD  Result Value Ref Range Status   Specimen Description BLOOD BLOOD LEFT FOREARM  Final   Special Requests   Final    BOTTLES DRAWN AEROBIC AND ANAEROBIC Blood Culture results may not be optimal due to an inadequate volume  of blood received in culture bottles   Culture   Final    NO GROWTH 5 DAYS Performed at [facility], [address], [place], [place] [address]    Report Status 02/07/2024 FINAL  Final  MRSA Next Gen by PCR, Nasal     Status: None   Collection Time: 02/02/24  7:11 PM   Specimen: Nasal Mucosa; Nasal Swab  Result Value Ref Range Status   MRSA by PCR Next Gen NOT DETECTED NOT DETECTED Final    Comment: (NOTE) The GeneXpert MRSA Assay (FDA approved for NASAL specimens only), is  one component of a comprehensive MRSA colonization surveillance program. It is not intended to diagnose MRSA infection nor to guide or monitor treatment for MRSA infections. Test performance is not FDA approved in patients less than 49 years old. Performed at [facility], [address] [place], [place] [address]   Respiratory (~20 pathogens) panel by PCR     Status: None   Collection Time: 02/02/24  7:11 PM   Specimen: Nasopharyngeal Swab; Respiratory  Result Value Ref Range Status   Adenovirus NOT DETECTED NOT DETECTED Final   Coronavirus 229E NOT DETECTED NOT DETECTED Final    Comment: (NOTE) The Coronavirus on the Respiratory Panel, DOES NOT test for the novel  Coronavirus (2019 nCoV)    Coronavirus HKU1 NOT DETECTED NOT DETECTED Final   Coronavirus NL63 NOT DETECTED NOT DETECTED Final   Coronavirus OC43 NOT DETECTED NOT DETECTED Final   Metapneumovirus NOT DETECTED NOT DETECTED Final   Rhinovirus / Enterovirus NOT DETECTED NOT DETECTED Final   Influenza A NOT DETECTED NOT DETECTED Final   Influenza B NOT DETECTED NOT DETECTED Final   Parainfluenza Virus 1 NOT DETECTED NOT DETECTED Final   Parainfluenza Virus 2 NOT DETECTED NOT DETECTED Final   Parainfluenza Virus 3 NOT DETECTED NOT DETECTED Final   Parainfluenza Virus 4 NOT DETECTED NOT DETECTED Final   Respiratory Syncytial Virus NOT DETECTED NOT DETECTED Final   Bordetella pertussis NOT DETECTED NOT DETECTED Final    Bordetella Parapertussis NOT DETECTED NOT DETECTED Final   Chlamydophila pneumoniae NOT DETECTED NOT DETECTED Final   Mycoplasma pneumoniae NOT DETECTED NOT DETECTED Final    Comment: Performed at [facility], [address], [place], [place] [address]    Coagulation Studies: Recent Labs    02/28/24 1136  LABPROT 14.4  INR 1.1    Urinalysis: No results for input(s): COLORURINE, LABSPEC, PHURINE, GLUCOSEU, HGBUR, BILIRUBINUR, KETONESUR, PROTEINUR, UROBILINOGEN, NITRITE, LEUKOCYTESUR in the last 168 hours.  Invalid input(s): APPERANCEUR  Lipid Panel:    Component Value Date/Time   CHOL 161 05/16/2021 0612   TRIG 87 05/16/2021 0612   HDL 34 (L) 05/16/2021 0612   CHOLHDL 4.7 05/16/2021 0612   VLDL 17 05/16/2021 0612   LDLCALC 110 (H) 05/16/2021 0612    HgbA1C:  Lab Results  Component Value Date   HGBA1C 6.0 (H) 05/16/2021    Urine Drug Screen:      Component Value Date/Time   LABOPIA POSITIVE (A) 05/14/2021 2347   COCAINSCRNUR NONE DETECTED 05/14/2021 2347   LABBENZ NONE DETECTED 05/14/2021 2347   AMPHETMU NONE DETECTED 05/14/2021 2347   THCU NONE DETECTED 05/14/2021 2347   LABBARB NONE DETECTED 05/14/2021 2347    Alcohol Level:  Recent Labs  Lab 02/28/24 1136  ETH <15    Imaging: CT HEAD WO CONTRAST Result Date: 02/28/2024 EXAM: CT HEAD WITHOUT CONTRAST 02/28/2024 12:43:17 PM TECHNIQUE: CT of the head was performed without the administration of intravenous contrast. Automated exposure control, iterative reconstruction, and/or weight based adjustment of the mA/kV was utilized to reduce the radiation dose to as low as reasonably achievable. COMPARISON: Head MRI and CTA 06/10/2023. CLINICAL HISTORY: Altered Mental Status. FINDINGS: BRAIN AND VENTRICLES: There is no evidence of an acute infarct, intracranial hemorrhage, mass, midline shift, hydrocephalus, or extra-axial fluid collection. A small chronic left frontoparietal infarct is again  noted. Calcified atherosclerosis at the skull base. ORBITS: No acute abnormality. SINUSES: No acute abnormality. SOFT TISSUES AND SKULL: No acute soft tissue abnormality. No skull fracture. Small left  mastoid effusion. IMPRESSION: 1. No acute intracranial abnormality. 2. Small chronic left frontoparietal infarct. Electronically signed by: Dasie Hamburg MD 02/28/2024 01:10 PM EST RP Workstation: [id number]    Assessment: 53 y.o. female 53 y.o. 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  Patient reports going to bed at baseline.  Awakened this AM and noted that she was numb on the left.  Symptoms still persist. Patient also reports that over the past week she has had episodes of presyncope and or feelings of altered mental status.  They resolve spontaneously.  Early this morning though she was noted to have fallen out of bed and was very confused after.  Patient is at baseline at this time.  There was no tongue biting or bowel/bladder incontinence. At this point unclear what is causing patient's episodes of altered mental status.  Differential includes TIA versus CVA versus arrhythmia versus seizure.  Further work up recommended.   Would also like to rule out the possibility of a new infarct with persistent left sided numbness.  Head CT reviewed and shows no acute changes.  CTA of the head and neck show stable aneurysms and no evidence of LVO.    Stroke Risk Factors - smoking  Plan: 1. HgbA1c, fasting lipid panel, UDS 2. MRI of the brain without contrast 3. PT consult, OT consult, Speech consult 4. Echocardiogram 5. EEG 6. Prophylactic therapy-Dual antiplatelet therapy with ASA 81mg  and Plavix  75mg  for three weeks with return to Plavix  75mg  alone as monotherapy after that time.  7. NPO until RN stroke  swallow screen 8. Telemetry monitoring 9. Frequent neuro checks 10. Smoking cessation counseling   Sonny Hock, MD Neurology  02/28/2024, 4:01 PM

## 2024-02-28 NOTE — ED Notes (Signed)
Hospitalist Provider at bedside. 

## 2024-02-29 ENCOUNTER — Observation Stay

## 2024-02-29 ENCOUNTER — Observation Stay (HOSPITAL_BASED_OUTPATIENT_CLINIC_OR_DEPARTMENT_OTHER)
Admit: 2024-02-29 | Discharge: 2024-02-29 | Disposition: A | Discharge status: Home / Self Care | Attending: Physician Assistant | Admitting: Physician Assistant

## 2024-02-29 DIAGNOSIS — R55 Syncope and collapse: Secondary | ICD-10-CM

## 2024-02-29 DIAGNOSIS — R569 Unspecified convulsions: Secondary | ICD-10-CM | POA: Diagnosis not present

## 2024-02-29 DIAGNOSIS — R4182 Altered mental status, unspecified: Principal | ICD-10-CM

## 2024-02-29 DIAGNOSIS — R42 Dizziness and giddiness: Secondary | ICD-10-CM

## 2024-02-29 DIAGNOSIS — G459 Transient cerebral ischemic attack, unspecified: Secondary | ICD-10-CM | POA: Diagnosis not present

## 2024-02-29 DIAGNOSIS — S0031XA Abrasion of nose, initial encounter: Secondary | ICD-10-CM | POA: Diagnosis not present

## 2024-02-29 DIAGNOSIS — F17210 Nicotine dependence, cigarettes, uncomplicated: Secondary | ICD-10-CM | POA: Diagnosis not present

## 2024-02-29 DIAGNOSIS — R200 Anesthesia of skin: Secondary | ICD-10-CM | POA: Diagnosis not present

## 2024-02-29 LAB — HEMOGLOBIN A1C
Hgb A1c MFr Bld: 5.1 % (ref 4.8–5.6)
Mean Plasma Glucose: 99.67 mg/dL

## 2024-02-29 LAB — LIPID PANEL
Cholesterol: 123 mg/dL (ref 0–200)
HDL: 47 mg/dL (ref >40)
LDL Cholesterol: 63 mg/dL (ref 0–99)
Total CHOL/HDL Ratio: 2.6 ratio
Triglycerides: 66 mg/dL (ref <150)
VLDL: 13 mg/dL (ref 0–40)

## 2024-02-29 LAB — HIV ANTIBODY (ROUTINE TESTING W REFLEX): HIV Screen 4th Generation wRfx: NONREACTIVE

## 2024-02-29 MED ORDER — IOHEXOL 350 MG/ML SOLN
75.0000 mL | Freq: Once | INTRAVENOUS | Status: AC | PRN
  Administered 2024-02-29: 75 mL via INTRAVENOUS

## 2024-02-29 MED ORDER — ASPIRIN 81 MG PO TBEC: 81.0000 mg | DELAYED_RELEASE_TABLET | Freq: Every day | ORAL | 0 refills | Status: AC

## 2024-02-29 NOTE — Progress Notes (Signed)
 Orders only for 30 day monitor.   Courtney Mayer

## 2024-02-29 NOTE — Progress Notes (Signed)
 SLP Cancellation Note  Patient Details Name: Courtney Mayer MRN: [id number] DOB: 1970/11/17   Cancelled treatment:       Reason Eval/Treat Not Completed: SLP screened, no needs identified, will sign off   Casimiro Lienhard 02/29/2024, 10:55 AM

## 2024-02-29 NOTE — Plan of Care (Signed)
  Problem: Coping: Goal: Will verbalize positive feelings about self Outcome: Progressing Goal: Will identify appropriate support needs Outcome: Progressing   Problem: Education: Goal: Knowledge of General Education information will improve Description: Including pain rating scale, medication(s)/side effects and non-pharmacologic comfort measures Outcome: Progressing   Problem: Clinical Measurements: Goal: Ability to maintain clinical measurements within normal limits will improve Outcome: Progressing Goal: Will remain free from infection Outcome: Progressing

## 2024-02-29 NOTE — Evaluation (Signed)
 Occupational Therapy Evaluation Patient Details Name: Courtney Mayer MRN: [id number] DOB: May 16, 1970 Today's Date: 02/29/2024   History of Present Illness   Pt is a 53 y.o. female comes to the ER with altered mental status. Pt fell at home and had LUE numbness. Admitted for stroke workup. PMH of chronic back pain on narcotics, chronic COPD on 3 L 02 via [place], history of CVA, lung cancer     Clinical Impressions Pt was seen for OT evaluation this date. PTA, she resides in a one level home with 5 STE with her mother. Pt reports at baseline she is IND with all ADLs, IADLs and mobility without AD use. Reports that she has no fall history and this fall was d/t her rolling OOB while asleep. She has mild soreness to her L hip/buttocks region. A and O x3-4 (stated it was August or September, but knew it was 2025. She reports tingling to L forearm/hand, however is able to detect light touch. Anticipate decreased proprioception. She demo all bed mobility, transfers and mobility without AD use with supervision upon initial stand progressing to MOD I. She demo ADL tasks requiring BUEs to button shirt and perform standing oral care without difficulty. Pt reports recently wearing her 02 more d/t recent PNA infection, noted with desat to 85% on RA after mobility/stair training up/down 4 steps with HR use. Quick improvement to 93% on 2L. Pt educated on ECS to implement during daily routine and verbalized understanding. No further acute OT needs or follow up therapy indicated. Pt reports she is at her baseline.     If plan is discharge home, recommend the following:         Functional Status Assessment   Patient has not had a recent decline in their functional status     Equipment Recommendations   None recommended by OT     Recommendations for Other Services         Precautions/Restrictions         Mobility Bed Mobility Overal bed mobility: Independent                   Transfers Overall transfer level: Modified independent Equipment used: None               General transfer comment: ambulated and performed stair training with supervision to MODI      Balance Overall balance assessment: Modified Independent                                         ADL either performed or assessed with clinical judgement   ADL Overall ADL's : Modified independent                                       General ADL Comments: able to stand at sink and brush her teeth independently using BUEs, able to button up her shirt without difficulty using both hands     Vision         Perception         Praxis         Pertinent Vitals/Pain Pain Assessment Pain Assessment: Faces Faces Pain Scale: Hurts a little bit Pain Location: L hip/buttocks soreness Pain Descriptors / Indicators: Sore Pain Intervention(s): Monitored during session     Extremity/Trunk Assessment Upper Extremity  Assessment Upper Extremity Assessment: Overall WFL for tasks assessed;Right hand dominant;LUE deficits/detail LUE Sensation: decreased proprioception   Lower Extremity Assessment Lower Extremity Assessment: Overall WFL for tasks assessed       Communication Communication Communication: No apparent difficulties   Cognition Arousal: Alert Behavior During Therapy: WFL for tasks assessed/performed Cognition: No apparent impairments                               Following commands: Intact       Cueing  General Comments      sp02 at 95% on 2L, pt reports at baseline she was not wearing her 02 at all times, however recently had PNA and began wearing it more; removed for ambulation and stair training and dropped to 85% on RA, but quickly improved to 93% on 2L upon return to room   Exercises Other Exercises Other Exercises: Edu on role of OT in acute setting and ECS to implement during ADL/IADL performance. Pt verbalized  understanding.   Shoulder Instructions      Home Living Family/patient expects to be discharged to:: Private residence Living Arrangements: Parent (mother) Available Help at Discharge: Family;Friend(s);Available PRN/intermittently Type of Home: House Home Access: Stairs to enter [employer] of Steps: 5 Entrance Stairs-Rails: Right;Left;Can reach both Home Layout: One level     Bathroom Shower/Tub: Tub/shower unit;Walk-in shower   Bathroom Toilet: Handicapped height     Home Equipment: Shower seat - built in          Prior Functioning/Environment Prior Level of Function : Independent/Modified Independent;Driving;History of Falls (last six months)             Mobility Comments: IND without AD use, 1 fall when rolling out of bed while asleep leading to this admission ADLs Comments: IND, grocery shops, drives    OT Problem List: Decreased activity tolerance   OT Treatment/Interventions:        OT Goals(Current goals can be found in the care plan section)       OT Frequency:       Co-evaluation              AM-PAC OT 6 Clicks Daily Activity     Outcome Measure Help from another person eating meals?: None Help from another person taking care of personal grooming?: None Help from another person toileting, which includes using toliet, bedpan, or urinal?: None Help from another person bathing (including washing, rinsing, drying)?: None Help from another person to put on and taking off regular upper body clothing?: None Help from another person to put on and taking off regular lower body clothing?: None 6 Click Score: 24   End of Session Nurse Communication: Mobility status  Activity Tolerance: Patient tolerated treatment well Patient left: in bed;with call bell/phone within reach;with nursing/sitter in room  OT Visit Diagnosis: Other abnormalities of gait and mobility (R26.89)                Time: 9244-9188 OT Time Calculation (min): 16  min Charges:  OT General Charges $OT Visit: 1 Visit OT Evaluation $OT Eval Low Complexity: 1 Low  Courtney Mayer, OTR/L  02/29/24, 8:22 AM   John Vasconcelos E Hassie Mandt 02/29/2024, 8:18 AM

## 2024-02-29 NOTE — Procedures (Signed)
 Routine EEG Report  Courtney Mayer is a 53 y.o. female with a history of altered mental status who is undergoing an EEG to evaluate for seizures.  Report: This EEG was acquired with electrodes placed according to the International 10-20 electrode system (including Fp1, Fp2, F3, F4, C3, C4, P3, P4, O1, O2, T3, T4, T5, T6, A1, A2, Fz, Cz, Pz). The following electrodes were missing or displaced: none.  The occipital dominant rhythm was 9 Hz. This activity is reactive to stimulation. Drowsiness was manifested by background fragmentation; deeper stages of sleep were not identified. There was no focal slowing. There were no interictal epileptiform discharges. There were no electrographic seizures identified. There was no abnormal response to photic stimulation or hyperventilation.   Impression: This EEG was obtained while awake and drowsy and is normal.    Clinical Correlation: Normal EEGs, however, do not rule out epilepsy.  Elida Ross, MD [facility] [phone]  If 7pm- 7am, please page neurology on call as listed in AMION.

## 2024-02-29 NOTE — Progress Notes (Signed)
 TOC Brief Assessment:  Patient admitted from home for stroke workup. Neuro following. No TOC needs identified. Please outreach if needs arise.

## 2024-02-29 NOTE — Progress Notes (Signed)
 PT Cancellation Note  Patient Details Name: Courtney Mayer MRN: [id number] DOB: Oct 09, 1970   Cancelled Treatment:    Reason Eval/Treat Not Completed: PT screened, no needs identified, will sign off. Orders received and chart reviewed. Per OT note pt close to baseline completing ADL's and short community gait distances and stair training without issue. Upon entry to room pt decline skilled PT needs and no questions or concerns with mobility after OT session. PT to sign off in house.    Dorina HERO. Fairly IV, PT, DPT Physical Therapist- [place]  [facility] 02/29/2024, 9:16 AM

## 2024-02-29 NOTE — Progress Notes (Signed)
 NEUROLOGY CONSULT FOLLOW UP NOTE   Date of service: February 29, 2024 Patient Name: Courtney Mayer MRN:  [id number] DOB:  Jul 18, 1970  Interval Hx/subjective   No acute events overnight, patient is now back to baseline mental status  Vitals   Vitals:   02/28/24 1927 02/29/24 0014 02/29/24 0356 02/29/24 0813  BP: 109/75 (!) 94/56 111/71 (!) 132/90  Pulse: 88 83 71 77  Resp: 18 18 18 16   Temp: 98.1 F (36.7 C) 97.6 F (36.4 C) 97.7 F (36.5 C) 97.6 F (36.4 C)  TempSrc:    Oral  SpO2: 96% 96% 99% 94%  Weight:      Height:         Body mass index is 19.47 kg/m.  Physical Exam   HEENT-  Normocephalic, no lesions, without obvious abnormality.  Normal external eye and conjunctiva.  Normal TM's bilaterally.  Normal auditory canals and external ears. Normal external nose, mucus membranes and septum.  Normal pharynx. Cardiovascular- Single S1, S2, pulses palpable throughout   Lungs- CTA Abdomen- soft and nontender Extremities- no edema Musculoskeletal-no joint tenderness, deformity or swelling Skin-bruising on face  Neurologic Examination   Mental Status: Alert, oriented, thought content appropriate.  Some difficulty with memory.  Speech fluent without evidence of aphasia.  Able to follow 3 step commands without difficulty. Cranial Nerves: II: Visual fields grossly normal, pupils equal, round, reactive to light and accommodation III,IV, VI: ptosis not present, extra-ocular motions intact bilaterally V,VII: smile symmetric, facial light touch sensation decreased on the left VIII: hearing normal bilaterally IX,X: gag reflex present XI: bilateral shoulder shrug XII: midline tongue extension Motor: Right :  Upper extremity   5/5                                      Left:     Upper extremity   5/5             Lower extremity   5/5                                                  Lower extremity   5/5 Tone and bulk:normal tone throughout; no atrophy noted Sensory: Pinprick  and light touch decreased on the left upper and lower extremities Deep Tendon Reflexes: Symmetric throughout Plantars: Right: mute                              Left: mute Cerebellar: normal finger-to-nose and normal heel-to-shin testing bilaterally Gait: not tested due to safety concerns   Medications  Current Facility-Administered Medications:     stroke: early stages of recovery book, , Does not apply, Once, Patel, Sona, MD   acetaminophen  (TYLENOL ) tablet 650 mg, 650 mg, Oral, Q4H PRN **OR** acetaminophen  (TYLENOL ) 160 MG/5ML solution 650 mg, 650 mg, Per Tube, Q4H PRN **OR** acetaminophen  (TYLENOL ) suppository 650 mg, 650 mg, Rectal, Q4H PRN, Tobie Calix, MD   oxyCODONE  (Oxy IR/ROXICODONE ) immediate release tablet 10 mg, 10 mg, Oral, Q6H PRN, 10 mg at 02/29/24 0905 **AND** acetaminophen  (TYLENOL ) tablet 325 mg, 325 mg, Oral, Q6H PRN, Hunt, Madison H, RPH, 325 mg at 02/29/24 0904   albuterol  (PROVENTIL ) (2.5 MG/3ML) 0.083% nebulizer solution 2.5 mg, 2.5 mg, Inhalation,  Q6H PRN, Patel, Sona, MD   ALPRAZolam  (XANAX ) tablet 0.5 mg, 0.5 mg, Oral, TID PRN, Patel, Sona, MD, 0.5 mg at 02/28/24 2044   amitriptyline  (ELAVIL ) tablet 300 mg, 300 mg, Oral, QHS, Patel, Sona, MD, 300 mg at 02/28/24 2311   aspirin  suppository 300 mg, 300 mg, Rectal, Daily **OR** aspirin  tablet 325 mg, 325 mg, Oral, Daily, Patel, Sona, MD, 325 mg at 02/29/24 0855   atorvastatin  (LIPITOR) tablet 20 mg, 20 mg, Oral, Daily, Patel, Sona, MD, 20 mg at 02/29/24 0855   clopidogrel  (PLAVIX ) tablet 75 mg, 75 mg, Oral, Daily, Patel, Sona, MD, 75 mg at 02/29/24 0855   enoxaparin  (LOVENOX ) injection 40 mg, 40 mg, Subcutaneous, Q24H, Patel, Sona, MD, 40 mg at 02/28/24 2314   gabapentin  (NEURONTIN ) capsule 900 mg, 900 mg, Oral, QID, Patel, Sona, MD, 900 mg at 02/29/24 0354   lurasidone  (LATUDA ) tablet 60 mg, 60 mg, Oral, QHS, Patel, Sona, MD, 60 mg at 02/28/24 2308   montelukast  (SINGULAIR ) tablet 10 mg, 10 mg, Oral, QHS, Patel, Sona,  MD, 10 mg at 02/28/24 2309   pantoprazole  (PROTONIX ) EC tablet 40 mg, 40 mg, Oral, Daily, Patel, Sona, MD, 40 mg at 02/29/24 0855   QUEtiapine  (SEROQUEL ) tablet 100 mg, 100 mg, Oral, QHS, Tobie Calix, MD, 100 mg at 02/28/24 2310  Labs and Diagnostic Imaging   CBC:  Recent Labs  Lab 02/28/24 1136 02/28/24 2014  WBC 12.6* 12.4*  NEUTROABS 9.6*  --   HGB 12.5 11.2*  HCT 39.3 34.4*  MCV 100.5* 98.3  PLT 330 287    Basic Metabolic Panel:  Lab Results  Component Value Date   NA 137 02/28/2024   K 4.1 02/28/2024   CO2 24 02/28/2024   GLUCOSE 136 (H) 02/28/2024   BUN 14 02/28/2024   CREATININE 0.78 02/28/2024   CALCIUM  9.1 02/28/2024   GFRNONAA >60 02/28/2024   Lipid Panel:  Lab Results  Component Value Date   LDLCALC 63 02/29/2024   HgbA1c:  Lab Results  Component Value Date   HGBA1C 5.1 02/28/2024   Urine Drug Screen:     Component Value Date/Time   LABOPIA POSITIVE (A) 05/14/2021 2347   COCAINSCRNUR NONE DETECTED 05/14/2021 2347   LABBENZ NONE DETECTED 05/14/2021 2347   AMPHETMU NONE DETECTED 05/14/2021 2347   THCU NONE DETECTED 05/14/2021 2347   LABBARB NONE DETECTED 05/14/2021 2347    Alcohol Level     Component Value Date/Time   ETH <15 02/28/2024 1136   INR  Lab Results  Component Value Date   INR 1.1 02/28/2024   APTT  Lab Results  Component Value Date   APTT 27 02/28/2024   AED levels: No results found for: PHENYTOIN, ZONISAMIDE, LAMOTRIGINE, LEVETIRACETA   All CNS imaging personally reviewed  MRI brain  1. No acute intracranial abnormality. 2. Small remote cortical infarct involving the high left parietal lobe, postcentral gyrus. Superimposed punctate focus of diffusion signal abnormality at this location is stable as compared to prior brain MRI from 06/10/2023. Given stability, this is likely related to the underlying chronic infarct. 3. Few scattered chronic microhemorrhages, favored hypertensive in nature.  CTA head 1. No  intracranial large vessel occlusion or significant proximal stenosis. 2. Unchanged bilateral MCA bifurcation aneurysms measuring 3 mm on the right and 4-5 mm on the left.  CTA neck  1. Patent cervical carotid and vertebral arteries without stenosis. 2. Emphysema; consider evaluation for a low-dose CT lung cancer screening program.   TTE 1. Left ventricular ejection  fraction, by estimation, is 60 to 65% . The left ventricle has normal function. The left ventricle has no regional wall motion abnormalities. Left ventricular diastolic parameters were normal. 2. Right ventricular systolic function is normal. The right ventricular size is moderately enlarged. There is normal pulmonary artery systolic pressure. The estimated right ventricular systolic pressure is 15. 9 mmHg. 3. The mitral valve is normal in structure. Moderate mitral valve regurgitation. No evidence of mitral stenosis. 4. The aortic valve is normal in structure. Aortic valve regurgitation is not visualized. No aortic stenosis is present. 5. The inferior vena cava is normal in size with greater than 50% respiratory variability, suggesting right atrial pressure of 3 mmHg.  rEEG: normal  Stroke Labs     Component Value Date/Time   CHOL 123 02/29/2024 0552   TRIG 66 02/29/2024 0552   HDL 47 02/29/2024 0552   CHOLHDL 2.6 02/29/2024 0552   VLDL 13 02/29/2024 0552   LDLCALC 63 02/29/2024 0552    Lab Results  Component Value Date/Time   HGBA1C 5.1 02/28/2024 08:14 PM    Assessment    52 y.o. female 53 y.o. 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  Patient reports going to bed at baseline.  Awakened this AM and noted that she was numb on the left.  Symptoms still persist. Patient also reports that over the past week she has had episodes  of presyncope and or feelings of altered mental status.  They resolve spontaneously.  Early this morning though she was noted to have fallen out of bed and was very confused after.  Patient is at baseline at this time.  There was no tongue biting or bowel/bladder incontinence. MRI brain showed no acute infarct. CTA head showed stable aneurysms without acute findings and CT neck showed no hemodynamically-significant stenosis.  At this point unclear what is causing patient's episodes of altered mental status.  Consider cardiac etiologies incl arrhythmia or hypotension. Patient is back to baseline now therefore sx could be 2/2 TIA. TIA workup is also now complete.   Stroke Risk Factors - smoking  Recommendations   PT/OT/SLP Smoking cessation Prophylactic therapy-Dual antiplatelet therapy with ASA 81mg  and Plavix  75mg  for three weeks with return to Plavix  75mg  alone as monotherapy after that time.  Tele while inpatient, zio at discharge Referral for outpatient neurology f/u at discharge ______________________________________________________________________   Signed, Elida CHRISTELLA Ross, MD [facility]

## 2024-02-29 NOTE — Progress Notes (Signed)
 Eeg done

## 2024-02-29 NOTE — Discharge Summary (Addendum)
 Physician Discharge Summary   Patient: Courtney Mayer MRN: [id number] DOB: [birth year]  Admit date:     02/28/2024  Discharge date: 02/29/24  Discharge Physician: Leita Blanch   PCP: Lauretta Senior, MD   Recommendations at discharge:    F/u PCP in 1-2 weeks  Discharge Diagnoses: Principal Problem:   Altered mental status Active Problems:   Left sided numbness   Abrasion of nose   Courtney Mayer is a 53 y.o. female with medical history significant of chronic back pain on narcotics, chronic COPD on 3 L nasal cannula oxygen, history of CVA, lung cancer comes to the emergency room with altered mental status.   Altered mental status and left upper extremity weakness rule out stroke, seizure Postural dizziness --aspirin   plus Plavix --> drop asa after 21 days and cont plavix  -- statins -- patient seen by neurology Dr. Germaine.  --Recommends EEG--WNL -- MRI brain  No acute intracranial abnormality. 2. Small remote cortical infarct involving the high left parietal lobe, postcentral gyrus. Superimposed punctate focus of diffusion signal abnormality at this location is stable as compared to prior brain MRI from 06/10/2023. Given stability, this is likely related to the underlying chronic infarct. 3. Few scattered chronic microhemorrhages, favored hypertensive in nature.  ---CT head and neck Angio--No major vessel blockage -- PT OT and speech therapy to see--no PT needs --cont statins --Zio patch was placed by [facility] given ?presyncope--rec by Neurology   Chronic respiratory failure with chronic COPD -- continue home oxygen ,PRN nebs   Ongoing tobacco abuse -- advised cessation   Chronic narcotic use -- resume home dose  Overall back to baseline.  Pt will d/c home with out pt f/u PCP        Advance Care Planning:   Code Status: Full Code    Consults: neurology   Family Communication: no family in room      Pain control - [place]  Controlled Substance Reporting  System database was reviewed. and patient was instructed, not to drive, operate heavy machinery, perform activities at heights, swimming or participation in water activities or provide baby-sitting services while on Pain, Sleep and Anxiety Medications; until their outpatient Physician has advised to do so again. Also recommended to not to take more than prescribed Pain, Sleep and Anxiety Medications.   Disposition: Home Diet recommendation:  Discharge Diet Orders (From admission, onward)     Start     Ordered   02/29/24 0000  Diet - low sodium heart healthy        02/29/24 1519           Cardiac diet DISCHARGE MEDICATION: Allergies as of 02/29/2024       Reactions   Tramadol Palpitations   Can't take Ultram ER - palpitations Regular Tramadol is fine to take Can't take Ultram ER - palpitations Regular Tramadol is fine to take   Amoxicillin  Diarrhea   Compazine [prochlorperazine] Other (See Comments)   Excitability   Compazine [prochlorperazine] Anxiety        Medication List     TAKE these medications    acyclovir 400 MG tablet Commonly known as: ZOVIRAX Take 400 mg by mouth 2 (two) times daily as needed (Flair up).   albuterol  108 (90 Base) MCG/ACT inhaler Commonly known as: VENTOLIN  HFA Inhale 2 puffs into the lungs every 6 (six) hours as needed for wheezing or shortness of breath.   ALPRAZolam  0.5 MG tablet Commonly known as: XANAX  Take 0.5 mg by mouth 3 (three) times daily as needed  for anxiety.   amitriptyline  150 MG tablet Commonly known as: ELAVIL  Take 300 mg by mouth at bedtime.   aspirin  EC 81 MG tablet Take 1 tablet (81 mg total) by mouth daily for 21 days. Swallow whole.   atorvastatin  20 MG tablet Commonly known as: Lipitor Take 1 tablet (20 mg total) by mouth daily.   Breztri Aerosphere 160-9-4.8 MCG/ACT Aero inhaler Generic drug: budesonide -glycopyrrolate-formoterol  Inhale 2 puffs into the lungs in the morning and at bedtime.   clopidogrel   75 MG tablet Commonly known as: PLAVIX  Take 1 tablet (75 mg total) by mouth daily.   gabapentin  600 MG tablet Commonly known as: NEURONTIN  Take 900 mg by mouth QID.   ipratropium-albuterol  0.5-2.5 (3) MG/3ML Soln Commonly known as: DUONEB Take 3 mLs by nebulization every 6 (six) hours as needed.   Latuda  60 MG Tabs Generic drug: Lurasidone  HCl Take 60 mg by mouth at bedtime.   metoCLOPramide  10 MG tablet Commonly known as: REGLAN  Take 10 mg by mouth every 8 (eight) hours as needed.   montelukast  10 MG tablet Commonly known as: SINGULAIR  Take 1 tablet (10 mg total) by mouth at bedtime.   nicotine  21 mg/24hr patch Commonly known as: NICODERM CQ  - dosed in mg/24 hours Place 21 mg onto the skin daily.   nicotine  polacrilex 2 MG lozenge Commonly known as: Nicotine  Mini Take 1 lozenge (2 mg total) by mouth every 2 (two) hours as needed for smoking cessation.   oxyCODONE -acetaminophen  10-325 MG tablet Commonly known as: PERCOCET Take 1 tablet by mouth every 6 (six) hours as needed.   oxymorphone 10 MG tablet Commonly known as: OPANA Take 10 mg by mouth 2 (two) times daily as needed for pain.   oxymorphone 10 MG 12 hr tablet Commonly known as: OPANA ER Take 10 mg by mouth 2 (two) times daily as needed.   pantoprazole  40 MG tablet Commonly known as: PROTONIX  Take 40 mg by mouth daily.   QUEtiapine  100 MG tablet Commonly known as: SEROQUEL  Take 100 mg by mouth at bedtime.   zolpidem 10 MG tablet Commonly known as: AMBIEN Take 10 mg by mouth daily.        Follow-up Information     Lauretta Senior, MD Follow up.   Specialty: Internal Medicine Why: Hospital follow up Contact information: [address] [place] [address] [phone]                Filed Weights   02/28/24 1134  Weight: 53.1 kg     Condition at discharge: fair  The results of significant diagnostics from this hospitalization (including imaging, microbiology, ancillary  and laboratory) are listed below for reference.   Imaging Studies: CT ANGIO NECK W OR WO CONTRAST Result Date: 02/29/2024 EXAM: CTA Neck 02/29/2024 12:46:29 PM TECHNIQUE: CT of the neck was performed without and with the administration of 75 mL of intravenous contrast (iohexol  (OMNIPAQUE ) 350 MG/ML injection 75 mL IOHEXOL  350 MG/ML SOLN). Multiplanar 2D and/or 3D reformatted images are provided for review. Automated exposure control, iterative reconstruction, and/or weight based adjustment of the mA/kV was utilized to reduce the radiation dose to as low as reasonably achievable. Stenosis of the internal carotid arteries measured using NASCET criteria. COMPARISON: CTA head and neck 06/10/2023 CLINICAL HISTORY: Eval for carotid stenosis or vertebrobasilar insufficiency FINDINGS: AORTIC ARCH AND ARCH VESSELS: Mild atherosclerotic calcification in the aortic arch. No dissection or arterial injury. No significant stenosis of the brachiocephalic or subclavian arteries. CERVICAL CAROTID ARTERIES: Mild atherosclerotic  calcification at the left carotid bifurcation. No dissection, arterial injury, or hemodynamically significant stenosis by NASCET criteria. CERVICAL VERTEBRAL ARTERIES: Dominant right vertebral artery. No dissection, arterial injury, or significant stenosis. LUNGS AND MEDIASTINUM: Emphysema. SOFT TISSUES: No acute abnormality. BONES: Mild cervical spondylosis. The intracranial arteries were more fully evaluated on yesterday's CTA of the head. IMPRESSION: 1. Patent cervical carotid and vertebral arteries without stenosis. 2. Emphysema; consider evaluation for a low-dose CT lung cancer screening program. Electronically signed by: Dasie Hamburg MD 02/29/2024 03:08 PM EST RP Workstation: [id number]   EEG adult Result Date: 02/29/2024 Mayer Courtney HERO, MD     02/29/2024  2:59 PM Routine EEG Report Cing Bremer is a 53 y.o. female with a history of altered mental status who is undergoing an EEG to evaluate for  seizures. Report: This EEG was acquired with electrodes placed according to the International 10-20 electrode system (including Fp1, Fp2, F3, F4, C3, C4, P3, P4, O1, O2, T3, T4, T5, T6, A1, A2, Fz, Cz, Pz). The following electrodes were missing or displaced: none. The occipital dominant rhythm was 9 Hz. This activity is reactive to stimulation. Drowsiness was manifested by background fragmentation; deeper stages of sleep were not identified. There was no focal slowing. There were no interictal epileptiform discharges. There were no electrographic seizures identified. There was no abnormal response to photic stimulation or hyperventilation. Impression: This EEG was obtained while awake and drowsy and is normal.   Clinical Correlation: Normal EEGs, however, do not rule out epilepsy. Courtney Matthews, MD [facility] [phone] If 7pm- 7am, please page neurology on call as listed in AMION.   MR BRAIN WO CONTRAST Result Date: 02/28/2024 EXAM: MRI BRAIN WITHOUT CONTRAST 02/28/2024 10:33:18 PM TECHNIQUE: Multiplanar multisequence MRI of the head/brain was performed without the administration of intravenous contrast. COMPARISON: CT from earlier the same day as well as prior MRI from 06/10/2023. CLINICAL HISTORY: FINDINGS: BRAIN AND VENTRICLES: Small remote cortical infarct noted at the high posterior left parietal lobe, postcentral gyrus (series 19, image 43). Single punctate focus of diffusion signal abnormalities seen involving the postcentral gyrus of the left parietal lobe (series 17, image 42). This is relatively stable as compared to prior MRI from 06/10/2023. Given stability, this is likely related to the underlying chronic infarct. No other evidence for acute or subacute ischemia. Few scattered chronic microhemorrhages noted, favored hypertensive in nature. No acute infarct. No intracranial hemorrhage. No mass. No midline shift. No hydrocephalus. The sella is unremarkable. Normal flow voids. ORBITS: 4  mm cystic focus at the superior aspect of the right globe noted (series 24, image 10), stable from prior, and of no clinical significance. SINUSES AND MASTOIDS: No acute abnormality. BONES AND SOFT TISSUES: Normal marrow signal. No acute soft tissue abnormality. IMPRESSION: 1. No acute intracranial abnormality. 2. Small remote cortical infarct involving the high left parietal lobe, postcentral gyrus. Superimposed punctate focus of diffusion signal abnormality at this location is stable as compared to prior brain MRI from 06/10/2023. Given stability, this is likely related to the underlying chronic infarct. 3. Few scattered chronic microhemorrhages, favored hypertensive in nature. Electronically signed by: Morene Hoard MD 02/28/2024 10:51 PM EST RP Workstation: [id number]   CT ANGIO HEAD W OR WO CONTRAST Result Date: 02/28/2024 EXAM: CTA Head without and with Intravenous Contrast CLINICAL HISTORY: Neuro deficit, acute, stroke suspected. Additional clinical history of altered mental status. TECHNIQUE: Axial CTA images of the head without and with intravenous contrast. MIP reconstructed images were created and reviewed. Dose reduction technique was  used including one or more of the following: automated exposure control, adjustment of mA and kV according to patient size, and/or iterative reconstruction. CONTRAST: Without and with; 75 mL (iohexol  (OMNIPAQUE ) 350 MG/ML injection 75 mL IOHEXOL  350 MG/ML SOLN). COMPARISON: CTA head and neck 06/10/2023 and 05/15/2021. FINDINGS: ANTERIOR CIRCULATION: The included distal cervical and intracranial portions of the internal carotid arteries are patent with minimal calcified plaque in the right cavernous segment not resulting in a significant stenosis. ACAs and MCAs are patent without evidence of a proximal branch occlusion or significant proximal stenosis. Bilateral MCA bifurcation aneurysms measuring 3 mm on the right and 4-5 mm on the left are unchanged from 2023.  POSTERIOR CIRCULATION: The included portions of the distal vertebral arteries are widely patent to the basilar with the right being dominant. Patent PICA and SCA origins are visualized bilaterally. The basilar artery is widely patent. There is a patent right posterior communicating artery. Both PCAs are patent without evidence of a significant proximal stenosis. No aneurysm. The dural venous sinuses appear patent on this non-dedicated study. IMPRESSION: 1. No intracranial large vessel occlusion or significant proximal stenosis. 2. Unchanged bilateral MCA bifurcation aneurysms measuring 3 mm on the right and 45 mm on the left. Electronically signed by: Dasie Hamburg MD 02/28/2024 05:47 PM EST RP Workstation: [id number]   CT HEAD WO CONTRAST Result Date: 02/28/2024 EXAM: CT HEAD WITHOUT CONTRAST 02/28/2024 12:43:17 PM TECHNIQUE: CT of the head was performed without the administration of intravenous contrast. Automated exposure control, iterative reconstruction, and/or weight based adjustment of the mA/kV was utilized to reduce the radiation dose to as low as reasonably achievable. COMPARISON: Head MRI and CTA 06/10/2023. CLINICAL HISTORY: Altered Mental Status. FINDINGS: BRAIN AND VENTRICLES: There is no evidence of an acute infarct, intracranial hemorrhage, mass, midline shift, hydrocephalus, or extra-axial fluid collection. A small chronic left frontoparietal infarct is again noted. Calcified atherosclerosis at the skull base. ORBITS: No acute abnormality. SINUSES: No acute abnormality. SOFT TISSUES AND SKULL: No acute soft tissue abnormality. No skull fracture. Small left mastoid effusion. IMPRESSION: 1. No acute intracranial abnormality. 2. Small chronic left frontoparietal infarct. Electronically signed by: Dasie Hamburg MD 02/28/2024 01:10 PM EST RP Workstation: [id number]   DG Chest 2 View Result Date: 02/05/2024 EXAM: 2 VIEW(S) XRAY OF THE CHEST 02/05/2024 08:02:00 AM COMPARISON: 02/02/2024 CLINICAL  HISTORY: Pneumonia per ordering notes. Patient reports SOB and cough. FINDINGS: LUNGS AND PLEURA: Increased left basilar opacity is noted suggesting atelectasis or infiltrate with possible small pleural effusion. No pulmonary edema. No pneumothorax. HEART AND MEDIASTINUM: Stable cardiomediastinal silhouette. Status post aortic valve repair. Sternotomy wires are noted. Some mediastinal shift to the left is noted. BONES AND SOFT TISSUES: Stable old mid thoracic compression fracture. IMPRESSION: 1. Increased left basilar opacity, which may reflect atelectasis or infection, with possible small left pleural effusion. 2. Leftward mediastinal shift. 3. Stable postoperative changes from aortic valve repair and sternotomy. 4. Stable chronic mid-thoracic compression fracture. Electronically signed by: Lynwood Seip MD 02/05/2024 09:23 AM EDT RP Workstation: [id number]   ECHOCARDIOGRAM COMPLETE Result Date: 02/03/2024    ECHOCARDIOGRAM REPORT   Patient Name:   MORAIMA BURD Date of Exam: 02/03/2024 Medical Rec #:  [id number]       Height:       65.0 in Accession #:    [id number]      Weight:       120.0 lb Date of Birth:  07-26-1970      BSA:  1.592 m Patient Age:    52 years        BP:           124/90 mmHg Patient Gender: F               HR:           88 bpm. Exam Location:  Inpatient Procedure: 2D Echo, Cardiac Doppler and Color Doppler (Both Spectral and Color            Flow Doppler were utilized during procedure). Indications:     Pulmonary hypertension  History:         Patient has prior history of Echocardiogram examinations, most                  recent 05/16/2021. COPD.  Sonographer:     Philomena Daring Referring Phys:  [id number] CARMEN L GONZALEZ Diagnosing Phys: Evalene Lunger MD IMPRESSIONS  1. Left ventricular ejection fraction, by estimation, is 60 to 65%. The left ventricle has normal function. The left ventricle has no regional wall motion abnormalities. Left ventricular diastolic parameters were normal.  2.  Right ventricular systolic function is normal. The right ventricular size is moderately enlarged. There is normal pulmonary artery systolic pressure. The estimated right ventricular systolic pressure is 15.9 mmHg.  3. The mitral valve is normal in structure. Moderate mitral valve regurgitation. No evidence of mitral stenosis.  4. The aortic valve is normal in structure. Aortic valve regurgitation is not visualized. No aortic stenosis is present.  5. The inferior vena cava is normal in size with greater than 50% respiratory variability, suggesting right atrial pressure of 3 mmHg. FINDINGS  Left Ventricle: Left ventricular ejection fraction, by estimation, is 60 to 65%. The left ventricle has normal function. The left ventricle has no regional wall motion abnormalities. Strain was performed and the global longitudinal strain is indeterminate. The left ventricular internal cavity size was normal in size. There is no left ventricular hypertrophy. Left ventricular diastolic parameters were normal. Right Ventricle: The right ventricular size is moderately enlarged. No increase in right ventricular wall thickness. Right ventricular systolic function is normal. There is normal pulmonary artery systolic pressure. The tricuspid regurgitant velocity is 1.65 m/s, and with an assumed right atrial pressure of 5 mmHg, the estimated right ventricular systolic pressure is 15.9 mmHg. Left Atrium: Left atrial size was normal in size. Right Atrium: Right atrial size was normal in size. Pericardium: There is no evidence of pericardial effusion. Mitral Valve: The mitral valve is normal in structure. Moderate mitral valve regurgitation. No evidence of mitral valve stenosis. Tricuspid Valve: The tricuspid valve is normal in structure. Tricuspid valve regurgitation is mild . No evidence of tricuspid stenosis. Aortic Valve: The aortic valve is normal in structure. Aortic valve regurgitation is not visualized. No aortic stenosis is present.  Aortic valve mean gradient measures 16.0 mmHg. Aortic valve peak gradient measures 28.0 mmHg. Aortic valve area, by VTI measures 1.27 cm. Pulmonic Valve: The pulmonic valve was normal in structure. Pulmonic valve regurgitation is mild. No evidence of pulmonic stenosis. Aorta: The aortic root is normal in size and structure. Venous: The inferior vena cava is normal in size with greater than 50% respiratory variability, suggesting right atrial pressure of 3 mmHg. IAS/Shunts: No atrial level shunt detected by color flow Doppler. Additional Comments: 3D was performed not requiring image post processing on an independent workstation and was indeterminate.  LEFT VENTRICLE PLAX 2D LVIDd:  4.60 cm   Diastology LVIDs:         2.80 cm   LV e' medial:    16.10 cm/s LV PW:         0.90 cm   LV E/e' medial:  7.3 LV IVS:        0.90 cm   LV e' lateral:   15.20 cm/s LVOT diam:     1.80 cm   LV E/e' lateral: 7.8 LV SV:         63 LV SV Index:   40 LVOT Area:     2.54 cm  RIGHT VENTRICLE             IVC RV S prime:     12.70 cm/s  IVC diam: 2.00 cm TAPSE (M-mode): 1.7 cm LEFT ATRIUM             Index        RIGHT ATRIUM           Index LA diam:        3.10 cm 1.95 cm/m   RA Area:     12.80 cm LA Vol (A2C):   34.9 ml 21.92 ml/m  RA Volume:   28.10 ml  17.65 ml/m LA Vol (A4C):   31.5 ml 19.79 ml/m LA Biplane Vol: 34.6 ml 21.73 ml/m  AORTIC VALVE                     PULMONIC VALVE AV Area (Vmax):    1.24 cm      PR End Diast Vel: 10.89 msec AV Area (Vmean):   1.22 cm AV Area (VTI):     1.27 cm AV Vmax:           264.75 cm/s AV Vmean:          185.250 cm/s AV VTI:            0.497 m AV Peak Grad:      28.0 mmHg AV Mean Grad:      16.0 mmHg LVOT Vmax:         129.50 cm/s LVOT Vmean:        88.850 cm/s LVOT VTI:          0.248 m LVOT/AV VTI ratio: 0.50  AORTA Ao Root diam: 2.80 cm MITRAL VALVE                TRICUSPID VALVE MV Area (PHT): 4.29 cm     TR Peak grad:   10.9 mmHg MV Decel Time: 177 msec     TR Vmax:         165.00 cm/s MV E velocity: 118.00 cm/s MV A velocity: 86.50 cm/s   SHUNTS MV E/A ratio:  1.36         Systemic VTI:  0.25 m                             Systemic Diam: 1.80 cm Evalene Lunger MD Electronically signed by Evalene Lunger MD Signature Date/Time: 02/03/2024/5:38:55 PM    Final    CT Angio Chest Pulmonary Embolism (PE) W or WO Contrast Result Date: 02/02/2024 CLINICAL DATA:  Rule out pulmonary embolus and evaluate pneumonia. Recent treatment for pneumonia. Persistent shortness of breath, not improved on treatment. Home oxygen use with decreasing oxygen saturations. EXAM: CT ANGIOGRAPHY CHEST WITH CONTRAST TECHNIQUE: Multidetector CT imaging of the chest was performed using the standard protocol  during bolus administration of intravenous contrast. Multiplanar CT image reconstructions and MIPs were obtained to evaluate the vascular anatomy. RADIATION DOSE REDUCTION: This exam was performed according to the departmental dose-optimization program which includes automated exposure control, adjustment of the mA and/or kV according to patient size and/or use of iterative reconstruction technique. CONTRAST:  75mL OMNIPAQUE  IOHEXOL  350 MG/ML SOLN COMPARISON:  Chest radiograph 02/02/2024.  CT chest 12/15/2023 FINDINGS: Cardiovascular: Technically adequate study with good opacification of the central and segmental pulmonary arteries. Mild motion artifact. No focal filling defects. No evidence of significant pulmonary embolus. Central pulmonary arteries are dilated suggesting pulmonary arterial hypertension. Cardiac enlargement. No pericardial effusions. Normal caliber thoracic aorta. No aortic dissection. Calcification of the aorta and coronary arteries. Previous postoperative changes with aortic valve repair. Mediastinum/Nodes: Esophagus is mildly distended with an air-fluid level present. This is nonspecific but could indicate dysmotility. No obstructing lesions are demonstrated. Thyroid gland is unremarkable.  Mediastinal lymph nodes are not pathologically enlarged, likely reactive. Lungs/Pleura: Severe emphysematous changes and fibrosis throughout the lungs. Patchy airspace disease demonstrated throughout both lungs, likely multifocal pneumonia but could indicate aspiration. Bronchiectasis with bronchial wall thickening and lower lobe mucous plugging may indicate chronic bronchitis or aspiration. Multiple nodular opacities in the lungs. Largest is in the left lung, series 5, image 52, measuring 8 mm diameter. Nodular changes are new since prior study and infiltrative changes have increased. This suggest these most likely represent acute inflammatory process. Follow-up after resolution is recommended to exclude underlying developing pulmonary nodules. No pleural effusion or pneumothorax. Upper Abdomen: No acute abnormalities. Musculoskeletal: Sternotomy wires are present. Compression deformities of multiple thoracic vertebrae, unchanged. No acute bony abnormalities. Review of the MIP images confirms the above findings. IMPRESSION: 1. No evidence of significant pulmonary embolus. Dilated central pulmonary arteries suggest pulmonary arterial hypertension. 2. Cardiac enlargement.  Aortic atherosclerosis. 3. Severe diffuse emphysematous changes and fibrosis throughout the lungs. 4. New patchy airspace disease throughout both lungs with developing nodular foci, bronchial wall thickening, bronchiectasis, and mucous plugging. Changes most likely to represent multifocal pneumonia with bronchitis or aspiration. Follow-up after resolution of the acute process is recommended. Mildly distended esophagus with air-fluid level, likely reflux or dysmotility. 5. Electronically Signed   By: Elsie Gravely M.D.   On: 02/02/2024 17:20   DG Chest 2 View Result Date: 02/02/2024 EXAM: 2 VIEW(S) XRAY OF THE CHEST 02/02/2024 01:05:46 PM COMPARISON: Comparison exam 01/05/2024. CLINICAL HISTORY: shortness of breath. Shortness of breath  shortness of breath. Shortness of breath FINDINGS: LUNGS AND PLEURA: Some improvement in bibasilar opacities. Chronic bronchial markings remain. Improved atelectasis of the left lung base. No pulmonary edema. No pleural effusion. No pneumothorax. HEART AND MEDIASTINUM: No acute abnormality of the cardiac and mediastinal silhouettes. BONES AND SOFT TISSUES: Midline sternotomy. No acute osseous abnormality. IMPRESSION: 1. Improving bibasilar opacities with decreased atelectasis at the left lung base. 2. Chronic bronchitic-type bronchial wall thickening persists. Electronically signed by: Norleen Boxer MD 02/02/2024 02:01 PM EDT RP Workstation: [id number]    Microbiology: Results for orders placed or performed during the hospital encounter of 02/02/24  Blood Culture (routine x 2)     Status: None   Collection Time: 02/02/24  2:33 PM   Specimen: BLOOD  Result Value Ref Range Status   Specimen Description BLOOD BLOOD RIGHT FOREARM  Final   Special Requests   Final    BOTTLES DRAWN AEROBIC AND ANAEROBIC Blood Culture adequate volume   Culture   Final    NO GROWTH 5 DAYS  Performed at [facility], [address] [place], [place] [address]    Report Status 02/07/2024 FINAL  Final  Resp panel by RT-PCR (RSV, Flu A&B, Covid) Anterior Nasal Swab     Status: None   Collection Time: 02/02/24  2:33 PM   Specimen: Anterior Nasal Swab  Result Value Ref Range Status   SARS Coronavirus 2 by RT PCR NEGATIVE NEGATIVE Final    Comment: (NOTE) SARS-CoV-2 target nucleic acids are NOT DETECTED.  The SARS-CoV-2 RNA is generally detectable in upper respiratory specimens during the acute phase of infection. The lowest concentration of SARS-CoV-2 viral copies this assay can detect is 138 copies/mL. A negative result does not preclude SARS-Cov-2 infection and should not be used as the sole basis for treatment or other patient management decisions. A negative result may occur with  improper specimen  collection/handling, submission of specimen other than nasopharyngeal swab, presence of viral mutation(s) within the areas targeted by this assay, and inadequate number of viral copies(<138 copies/mL). A negative result must be combined with clinical observations, patient history, and epidemiological information. The expected result is Negative.  Fact Sheet for Patients:  bloggercourse.com  Fact Sheet for Healthcare Providers:  seriousbroker.it  This test is no t yet approved or cleared by the [place]  FDA and  has been authorized for detection and/or diagnosis of SARS-CoV-2 by FDA under an Emergency Use Authorization (EUA). This EUA will remain  in effect (meaning this test can be used) for the duration of the COVID-19 declaration under Section 564(b)(1) of the Act, 21 U.S.C.section 360bbb-3(b)(1), unless the authorization is terminated  or revoked sooner.       Influenza A by PCR NEGATIVE NEGATIVE Final   Influenza B by PCR NEGATIVE NEGATIVE Final    Comment: (NOTE) The Xpert Xpress SARS-CoV-2/FLU/RSV plus assay is intended as an aid in the diagnosis of influenza from Nasopharyngeal swab specimens and should not be used as a sole basis for treatment. Nasal washings and aspirates are unacceptable for Xpert Xpress SARS-CoV-2/FLU/RSV testing.  Fact Sheet for Patients: bloggercourse.com  Fact Sheet for Healthcare Providers: seriousbroker.it  This test is not yet approved or cleared by the [place]  FDA and has been authorized for detection and/or diagnosis of SARS-CoV-2 by FDA under an Emergency Use Authorization (EUA). This EUA will remain in effect (meaning this test can be used) for the duration of the COVID-19 declaration under Section 564(b)(1) of the Act, 21 U.S.C. section 360bbb-3(b)(1), unless the authorization is terminated or revoked.     Resp Syncytial  Virus by PCR NEGATIVE NEGATIVE Final    Comment: (NOTE) Fact Sheet for Patients: bloggercourse.com  Fact Sheet for Healthcare Providers: seriousbroker.it  This test is not yet approved or cleared by the [place]  FDA and has been authorized for detection and/or diagnosis of SARS-CoV-2 by FDA under an Emergency Use Authorization (EUA). This EUA will remain in effect (meaning this test can be used) for the duration of the COVID-19 declaration under Section 564(b)(1) of the Act, 21 U.S.C. section 360bbb-3(b)(1), unless the authorization is terminated or revoked.  Performed at [facility], [address] [place], [place] [address]   Blood Culture (routine x 2)     Status: None   Collection Time: 02/02/24  2:35 PM   Specimen: BLOOD  Result Value Ref Range Status   Specimen Description BLOOD BLOOD LEFT FOREARM  Final   Special Requests   Final    BOTTLES DRAWN AEROBIC AND ANAEROBIC Blood Culture results may  not be optimal due to an inadequate volume of blood received in culture bottles   Culture   Final    NO GROWTH 5 DAYS Performed at [facility], [address] [place], [place] [address]    Report Status 02/07/2024 FINAL  Final  MRSA Next Gen by PCR, Nasal     Status: None   Collection Time: 02/02/24  7:11 PM   Specimen: Nasal Mucosa; Nasal Swab  Result Value Ref Range Status   MRSA by PCR Next Gen NOT DETECTED NOT DETECTED Final    Comment: (NOTE) The GeneXpert MRSA Assay (FDA approved for NASAL specimens only), is one component of a comprehensive MRSA colonization surveillance program. It is not intended to diagnose MRSA infection nor to guide or monitor treatment for MRSA infections. Test performance is not FDA approved in patients less than 17 years old. Performed at [facility], [address] [place], [place] [address]   Respiratory (~20 pathogens) panel by PCR     Status: None    Collection Time: 02/02/24  7:11 PM   Specimen: Nasopharyngeal Swab; Respiratory  Result Value Ref Range Status   Adenovirus NOT DETECTED NOT DETECTED Final   Coronavirus 229E NOT DETECTED NOT DETECTED Final    Comment: (NOTE) The Coronavirus on the Respiratory Panel, DOES NOT test for the novel  Coronavirus (2019 nCoV)    Coronavirus HKU1 NOT DETECTED NOT DETECTED Final   Coronavirus NL63 NOT DETECTED NOT DETECTED Final   Coronavirus OC43 NOT DETECTED NOT DETECTED Final   Metapneumovirus NOT DETECTED NOT DETECTED Final   Rhinovirus / Enterovirus NOT DETECTED NOT DETECTED Final   Influenza A NOT DETECTED NOT DETECTED Final   Influenza B NOT DETECTED NOT DETECTED Final   Parainfluenza Virus 1 NOT DETECTED NOT DETECTED Final   Parainfluenza Virus 2 NOT DETECTED NOT DETECTED Final   Parainfluenza Virus 3 NOT DETECTED NOT DETECTED Final   Parainfluenza Virus 4 NOT DETECTED NOT DETECTED Final   Respiratory Syncytial Virus NOT DETECTED NOT DETECTED Final   Bordetella pertussis NOT DETECTED NOT DETECTED Final   Bordetella Parapertussis NOT DETECTED NOT DETECTED Final   Chlamydophila pneumoniae NOT DETECTED NOT DETECTED Final   Mycoplasma pneumoniae NOT DETECTED NOT DETECTED Final    Comment: Performed at [facility], [address] [place], [place] [address]    Labs: CBC: Recent Labs  Lab 02/28/24 1136 02/28/24 2014  WBC 12.6* 12.4*  NEUTROABS 9.6*  --   HGB 12.5 11.2*  HCT 39.3 34.4*  MCV 100.5* 98.3  PLT 330 287   Basic Metabolic Panel: Recent Labs  Lab 02/28/24 1136 02/28/24 2014  NA 137  --   K 4.1  --   CL 99  --   CO2 24  --   GLUCOSE 136*  --   BUN 14  --   CREATININE 0.75 0.78  CALCIUM  9.1  --   MG 1.9  --    Liver Function Tests: Recent Labs  Lab 02/28/24 1136  AST 60*  ALT 20  ALKPHOS 71  BILITOT 0.3  PROT 8.0  ALBUMIN 3.3*   CBG: No results for input(s): GLUCAP in the last 168 hours.  Discharge time spent: greater than 30  minutes.  Signed: Leita Blanch, MD [facility] 02/29/2024

## 2024-03-01 LAB — ACID FAST CULTURE WITH REFLEXED SENSITIVITIES (MYCOBACTERIA): Acid Fast Culture: NEGATIVE

## 2024-03-02 ENCOUNTER — Ambulatory Visit: Admitting: Student in an Organized Health Care Education/Training Program

## 2024-03-02 ENCOUNTER — Encounter

## 2024-03-07 LAB — ACID FAST CULTURE WITH REFLEXED SENSITIVITIES (MYCOBACTERIA): Acid Fast Culture: NEGATIVE

## 2024-03-21 ENCOUNTER — Ambulatory Visit

## 2024-03-21 NOTE — Progress Notes (Deleted)
  Cardiology Office Note   Date:  03/21/2024  ID:  Courtney Mayer, DOB 06/30/1970, MRN [id number] PCP: Courtney Senior, MD  [facility] Providers Cardiologist:  None { Click to update primary MD,subspecialty MD or APP then REFRESH:1}    History of Present Illness Courtney Mayer is a 53 y.o. 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  Patient has recently discharged from [facility] 02/29/2024 where she presented with altered mental status.  Neurology evaluated the patient and felt that her presentation is most consistent with a TIA. MRI did not show any new CVAs.  Relevant CVD History - Zio monitor pending - TTE 01/2024 normal biventricular function, normal PASP, moderate MR. - Normal monitor 06/2023 - Bioprosthetic AVR (Inspiris Resilia 21mm) 08/14/2021 (Dr. Renda, [facility]) - CCTA 06/2021 CAD RADS 1 LAD.  Incidental finding of possible PFE on right coronary cusp (4.6 x 4.4 mm) - Zio monitor 06/2021 infrequent PAT, no other arrhythmias - TEE 05/2021 normal biventricular function, mass seen on right coronary cusp   ROS: Pt denies any chest discomfort, jaw pain, arm pain, palpitations, syncope, presyncope, orthopnea, PND, or LE edema.  Studies Reviewed I have independently reviewed the patient's ECG, previous cardiac testing and procedures, previous medical records.  Physical Exam VS:  There were no vitals taken for this visit.       Wt Readings from Last 3 Encounters:  02/28/24 117 lb (53.1 kg)  02/17/24 108 lb 9.6 oz (49.3 kg)  02/02/24 120 lb (54.4 kg)    GEN: No acute distress. NECK: No JVD; No carotid bruits. CARDIAC: ***RRR, no murmurs, rubs, gallops. RESPIRATORY:  Clear to auscultation. EXTREMITIES:  Warm and well-perfused. No edema.  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 stress test, cath, DCCV, TEE, etc)?   Press F2        :[id number]}  Dispo: ***  Signed, Courtney Poser, MD

## 2024-03-22 NOTE — Addendum Note (Signed)
 Encounter addended by: Cristopher Olivia PARAS, RN on: 03/22/2024 7:48 AM  Actions taken: Imaging Exam ended

## 2024-03-24 DIAGNOSIS — R42 Dizziness and giddiness: Secondary | ICD-10-CM

## 2024-03-24 DIAGNOSIS — R55 Syncope and collapse: Secondary | ICD-10-CM

## 2024-03-28 ENCOUNTER — Ambulatory Visit: Payer: Self-pay | Admitting: Physician Assistant

## 2024-04-01 ENCOUNTER — Encounter: Payer: Self-pay | Admitting: Emergency Medicine

## 2024-04-26 ENCOUNTER — Other Ambulatory Visit: Payer: Self-pay

## 2024-05-02 ENCOUNTER — Encounter

## 2024-05-02 ENCOUNTER — Ambulatory Visit: Admitting: Student in an Organized Health Care Education/Training Program

## 2024-05-02 DIAGNOSIS — J449 Chronic obstructive pulmonary disease, unspecified: Secondary | ICD-10-CM

## 2024-05-02 DIAGNOSIS — J42 Unspecified chronic bronchitis: Secondary | ICD-10-CM

## 2024-05-02 DIAGNOSIS — F17200 Nicotine dependence, unspecified, uncomplicated: Secondary | ICD-10-CM

## 2024-05-02 DIAGNOSIS — J441 Chronic obstructive pulmonary disease with (acute) exacerbation: Secondary | ICD-10-CM

## 2024-05-03 ENCOUNTER — Telehealth: Payer: Self-pay

## 2024-05-03 NOTE — Telephone Encounter (Signed)
 Called patient to discuss the LCS program. She currently has no insurance and request call back at a later date. Patient missed her PFT and OV yesterday with Dr. Isadora. Can someone please follow up with patient to get her rescheduled?  Isaiah, RN

## 2024-05-10 ENCOUNTER — Ambulatory Visit: Admitting: Student in an Organized Health Care Education/Training Program

## 2024-05-24 ENCOUNTER — Encounter: Payer: Self-pay | Admitting: Gastroenterology
